# Patient Record
Sex: Female | Born: 1935 | Race: White | Hispanic: No | Smoking: Former smoker
Health system: Southern US, Community
[De-identification: ages and names within clinical notes are randomized; demographics above are authoritative.]

## PROBLEM LIST (undated history)

## (undated) DIAGNOSIS — R0789 Other chest pain: Secondary | ICD-10-CM

## (undated) DIAGNOSIS — S42309A Unspecified fracture of shaft of humerus, unspecified arm, initial encounter for closed fracture: Secondary | ICD-10-CM

## (undated) DIAGNOSIS — F0390 Unspecified dementia without behavioral disturbance: Secondary | ICD-10-CM

## (undated) DIAGNOSIS — I5030 Unspecified diastolic (congestive) heart failure: Secondary | ICD-10-CM

## (undated) DIAGNOSIS — I10 Essential (primary) hypertension: Secondary | ICD-10-CM

## (undated) DIAGNOSIS — Z9981 Dependence on supplemental oxygen: Secondary | ICD-10-CM

## (undated) DIAGNOSIS — Z8679 Personal history of other diseases of the circulatory system: Secondary | ICD-10-CM

## (undated) DIAGNOSIS — R296 Repeated falls: Secondary | ICD-10-CM

## (undated) DIAGNOSIS — K219 Gastro-esophageal reflux disease without esophagitis: Secondary | ICD-10-CM

## (undated) DIAGNOSIS — F03A0 Unspecified dementia, mild, without behavioral disturbance, psychotic disturbance, mood disturbance, and anxiety: Secondary | ICD-10-CM

## (undated) DIAGNOSIS — S42209A Unspecified fracture of upper end of unspecified humerus, initial encounter for closed fracture: Secondary | ICD-10-CM

## (undated) DIAGNOSIS — W19XXXA Unspecified fall, initial encounter: Secondary | ICD-10-CM

## (undated) DIAGNOSIS — M1990 Unspecified osteoarthritis, unspecified site: Secondary | ICD-10-CM

## (undated) DIAGNOSIS — I350 Nonrheumatic aortic (valve) stenosis: Secondary | ICD-10-CM

## (undated) DIAGNOSIS — R55 Syncope and collapse: Secondary | ICD-10-CM

## (undated) DIAGNOSIS — R9431 Abnormal electrocardiogram [ECG] [EKG]: Secondary | ICD-10-CM

## (undated) DIAGNOSIS — D696 Thrombocytopenia, unspecified: Secondary | ICD-10-CM

## (undated) DIAGNOSIS — M419 Scoliosis, unspecified: Secondary | ICD-10-CM

## (undated) DIAGNOSIS — I4891 Unspecified atrial fibrillation: Secondary | ICD-10-CM

## (undated) DIAGNOSIS — G459 Transient cerebral ischemic attack, unspecified: Secondary | ICD-10-CM

## (undated) DIAGNOSIS — Z66 Do not resuscitate: Secondary | ICD-10-CM

## (undated) DIAGNOSIS — E46 Unspecified protein-calorie malnutrition: Secondary | ICD-10-CM

## (undated) DIAGNOSIS — A389 Scarlet fever, uncomplicated: Secondary | ICD-10-CM

## (undated) DIAGNOSIS — D649 Anemia, unspecified: Secondary | ICD-10-CM

## (undated) HISTORY — DX: Abnormal electrocardiogram (ECG) (EKG): R94.31

## (undated) HISTORY — PX: CATARACT EXTRACTION: SUR2

## (undated) HISTORY — DX: Syncope and collapse: R55

## (undated) HISTORY — DX: Gastro-esophageal reflux disease without esophagitis: K21.9

## (undated) HISTORY — DX: Unspecified fall, initial encounter: W19.XXXA

## (undated) HISTORY — PX: CHOLECYSTECTOMY: SHX55

## (undated) HISTORY — DX: Essential (primary) hypertension: I10

## (undated) HISTORY — DX: Nonrheumatic aortic (valve) stenosis: I35.0

## (undated) HISTORY — DX: Other chest pain: R07.89

## (undated) HISTORY — DX: Unspecified osteoarthritis, unspecified site: M19.90

## (undated) HISTORY — DX: Personal history of other diseases of the circulatory system: Z86.79

---

## 1997-07-05 DIAGNOSIS — G459 Transient cerebral ischemic attack, unspecified: Secondary | ICD-10-CM

## 1997-07-05 HISTORY — DX: Transient cerebral ischemic attack, unspecified: G45.9

## 2004-07-05 DIAGNOSIS — R55 Syncope and collapse: Secondary | ICD-10-CM

## 2004-07-05 HISTORY — DX: Syncope and collapse: R55

## 2010-03-26 ENCOUNTER — Ambulatory Visit: Payer: Self-pay | Admitting: Cardiology

## 2010-03-26 ENCOUNTER — Ambulatory Visit (HOSPITAL_COMMUNITY): Admission: RE | Admit: 2010-03-26 | Discharge: 2010-03-26 | Payer: Self-pay | Admitting: Cardiology

## 2010-12-01 ENCOUNTER — Ambulatory Visit
Admission: RE | Admit: 2010-12-01 | Discharge: 2010-12-01 | Disposition: A | Discharge status: Home / Self Care | Payer: Self-pay | Source: Ambulatory Visit | Attending: Family Medicine | Admitting: Family Medicine

## 2010-12-01 ENCOUNTER — Other Ambulatory Visit: Payer: Self-pay | Admitting: Family Medicine

## 2010-12-01 DIAGNOSIS — M25559 Pain in unspecified hip: Secondary | ICD-10-CM

## 2010-12-07 ENCOUNTER — Inpatient Hospital Stay: Admission: AD | Admit: 2010-12-07 | Payer: Self-pay | Source: Ambulatory Visit | Admitting: Internal Medicine

## 2010-12-07 ENCOUNTER — Inpatient Hospital Stay (HOSPITAL_COMMUNITY)
Admission: EM | Admit: 2010-12-07 | Discharge: 2010-12-10 | DRG: 552 | Disposition: A | Discharge status: Skilled Nursing Facility | Payer: Medicare Other | Attending: Internal Medicine | Admitting: Internal Medicine

## 2010-12-07 ENCOUNTER — Emergency Department (HOSPITAL_COMMUNITY): Payer: Medicare Other

## 2010-12-07 DIAGNOSIS — F411 Generalized anxiety disorder: Secondary | ICD-10-CM | POA: Diagnosis present

## 2010-12-07 DIAGNOSIS — E119 Type 2 diabetes mellitus without complications: Secondary | ICD-10-CM | POA: Diagnosis present

## 2010-12-07 DIAGNOSIS — I10 Essential (primary) hypertension: Secondary | ICD-10-CM | POA: Diagnosis present

## 2010-12-07 DIAGNOSIS — S3210XA Unspecified fracture of sacrum, initial encounter for closed fracture: Principal | ICD-10-CM | POA: Diagnosis present

## 2010-12-07 DIAGNOSIS — M545 Low back pain, unspecified: Secondary | ICD-10-CM | POA: Diagnosis present

## 2010-12-07 DIAGNOSIS — M810 Age-related osteoporosis without current pathological fracture: Secondary | ICD-10-CM | POA: Diagnosis present

## 2010-12-07 DIAGNOSIS — K219 Gastro-esophageal reflux disease without esophagitis: Secondary | ICD-10-CM | POA: Diagnosis present

## 2010-12-07 DIAGNOSIS — S322XXA Fracture of coccyx, initial encounter for closed fracture: Principal | ICD-10-CM | POA: Diagnosis present

## 2010-12-07 DIAGNOSIS — W19XXXA Unspecified fall, initial encounter: Secondary | ICD-10-CM | POA: Diagnosis present

## 2010-12-07 DIAGNOSIS — K5900 Constipation, unspecified: Secondary | ICD-10-CM | POA: Diagnosis present

## 2010-12-07 DIAGNOSIS — L408 Other psoriasis: Secondary | ICD-10-CM | POA: Diagnosis present

## 2010-12-07 LAB — BASIC METABOLIC PANEL
BUN: 9 mg/dL (ref 6–23)
Calcium: 9.4 mg/dL (ref 8.4–10.5)
GFR calc non Af Amer: >60 mL/min (ref >60)
Glucose, Bld: 116 mg/dL — ABNORMAL HIGH (ref 70–99)

## 2010-12-07 LAB — DIFFERENTIAL
Eosinophils Absolute: 0.1 10*3/uL (ref 0.0–0.7)
Lymphs Abs: 1.4 10*3/uL (ref 0.7–4.0)
Monocytes Relative: 8 % (ref 3–12)
Neutrophils Relative %: 74 % (ref 43–77)

## 2010-12-07 LAB — CBC
Hemoglobin: 11.6 g/dL — ABNORMAL LOW (ref 12.0–15.0)
MCH: 30.4 pg (ref 26.0–34.0)
MCV: 89 fL (ref 78.0–100.0)
Platelets: 241 10*3/uL (ref 150–400)
RBC: 3.81 MIL/uL — ABNORMAL LOW (ref 3.87–5.11)

## 2010-12-08 LAB — GLUCOSE, CAPILLARY: Glucose-Capillary: 105 mg/dL — ABNORMAL HIGH (ref 70–99)

## 2010-12-09 LAB — GLUCOSE, CAPILLARY
Glucose-Capillary: 114 mg/dL — ABNORMAL HIGH (ref 70–99)
Glucose-Capillary: 138 mg/dL — ABNORMAL HIGH (ref 70–99)

## 2010-12-10 ENCOUNTER — Inpatient Hospital Stay
Admission: RE | Admit: 2010-12-10 | Discharge: 2010-12-25 | Disposition: A | Discharge status: Home / Self Care | Payer: Medicare Other | Source: Ambulatory Visit | Attending: Internal Medicine | Admitting: Internal Medicine

## 2010-12-10 LAB — GLUCOSE, CAPILLARY: Glucose-Capillary: 127 mg/dL — ABNORMAL HIGH (ref 70–99)

## 2010-12-11 LAB — GLUCOSE, CAPILLARY: Glucose-Capillary: 154 mg/dL — ABNORMAL HIGH (ref 70–99)

## 2010-12-13 LAB — GLUCOSE, CAPILLARY: Glucose-Capillary: 140 mg/dL — ABNORMAL HIGH (ref 70–99)

## 2010-12-14 LAB — GLUCOSE, CAPILLARY: Glucose-Capillary: 121 mg/dL — ABNORMAL HIGH (ref 70–99)

## 2010-12-15 LAB — GLUCOSE, CAPILLARY: Glucose-Capillary: 101 mg/dL — ABNORMAL HIGH (ref 70–99)

## 2010-12-16 LAB — GLUCOSE, CAPILLARY
Glucose-Capillary: 98 mg/dL (ref 70–99)
Glucose-Capillary: 153 mg/dL — ABNORMAL HIGH (ref 70–99)

## 2010-12-17 LAB — GLUCOSE, CAPILLARY
Glucose-Capillary: 107 mg/dL — ABNORMAL HIGH (ref 70–99)
Glucose-Capillary: 148 mg/dL — ABNORMAL HIGH (ref 70–99)

## 2010-12-18 LAB — GLUCOSE, CAPILLARY: Glucose-Capillary: 132 mg/dL — ABNORMAL HIGH (ref 70–99)

## 2010-12-19 LAB — GLUCOSE, CAPILLARY

## 2010-12-20 LAB — GLUCOSE, CAPILLARY

## 2010-12-21 LAB — GLUCOSE, CAPILLARY: Glucose-Capillary: 165 mg/dL — ABNORMAL HIGH (ref 70–99)

## 2010-12-23 LAB — GLUCOSE, CAPILLARY: Glucose-Capillary: 110 mg/dL — ABNORMAL HIGH (ref 70–99)

## 2010-12-24 LAB — GLUCOSE, CAPILLARY
Glucose-Capillary: 114 mg/dL — ABNORMAL HIGH (ref 70–99)
Glucose-Capillary: 174 mg/dL — ABNORMAL HIGH (ref 70–99)

## 2011-01-18 NOTE — H&P (Signed)
Briana Schultz, Briana Schultz              ACCOUNT NO.:  [id number]  MEDICAL RECORD NO.:  [id number]  LOCATION:  WLED                         FACILITY:  [facility]  PHYSICIAN:  Altha Harm, MDDATE OF BIRTH:  1913/04/22  DATE OF ADMISSION:  12/07/2010 DATE OF DISCHARGE:                             HISTORY & PHYSICAL   CHIEF COMPLAINT:  Sacral pain.  HISTORY OF PRESENT ILLNESS:  Briana Schultz is a very lovely 77 year old lady who fell approximately 1 week ago.  The patient was seen by her primary care physician, evaluated for hip fracture where there was found to be none.  The patient went home with some pain medications.  However, her pain persisted and actually became worse.  She has got excruciating pain with any weightbearing through the right lower extremity.  She went back to her primary care physician today who asked that she come to the emergency room to be further evaluated.  A CT scan of the sacral area reveals an insufficiency fracture in the sacral area.  The patient at this point is unable to tolerate any weightbearing on her lower extremity without any pain.  Additionally, the patient was ambulating with 2 canes prior to this fall and at this point, she is unable to ambulate secondary to the pain.  Pain medication has been attempted as an outpatient and the pain is intractable, likely now requiring IV pain medications.  The patient has had no fever or chills.  She did not have a syncopal episode.  She has had a physical fall out of her chair. During the fall, the patient sustained a skin tear and skin burn along the right upper extremity.  PAST MEDICAL HISTORY:  Significant for the following: 1. Diabetes type 2, diet controlled. 2. Esophageal reflux. 3. Hypertension. 4. Irritable bowel syndrome. 5. Osteoporosis. 6. History of skin cancer. 7. Actinic keratosis. 8. Anxiety disorder. 9. Chronic constipation. 10.History of impetigo. 11.History of orthostatic  hypotension. 12.Progeria. 13.Psoriasis.  FAMILY HISTORY:  Significant for hypertension and anxiety.  SOCIAL HISTORY:  The patient resides by herself.  She has very good support from both her daughter and granddaughter who are here at the bedside with her.  There is no tobacco, alcohol, or drug use.  MEDICATIONS AT HOME:  Include the following: 1. Silver sulfadiazine apply topically to the affected areas. 2. Halobetasol topical apply to legs for psoriasis. 3. Tylenol 325 mg p.o. q.i.d. p.r.n. 4. Docusate 100 mg p.o. daily as needed. 5. Meloxicam 7.5 mg p.o. daily. 6. VitaSmart Complete Senior over-the-counter 1 tablet p.o. daily. 7. Alprazolam 0.5 mg p.o. daily at bedtime. 8. Nexium 40 mg p.o. b.i.d. 9. Amlodipine 2.5 mg p.o. q.h.s. 10.Carvedilol 6.25 mg p.o. b.i.d. 11.Diovan/hydrochlorothiazide 160/12.5 mg p.o. daily.  ALLERGIES: 1. AMOXICILLIN. 2. DOXYCYCLINE.  PRIMARY CARE PHYSICIAN:  Gloriajean Dell. Andrey Campanile, MD  REVIEW OF SYSTEMS:  The patient admits to having occasional polyuria when her blood sugars approach 190.  Otherwise, review of systems negative.  Studies in the emergency room show the following: 1. Hemogram shows a white blood cell count of 8.3, hemoglobin of 11.6,     hematocrit of 33.9, and platelet count of 241.  Sodium 135,  potassium 3.5, chloride 94, bicarbonate 32, BUN 9, and creatinine     0.53.  CT of the pelvis without contrast shows bilateral sacral     alar fracture most consistent with insufficiency fractures. 2. Diverticulosis. 3. X-ray of the lumbar spine, which shows lumbar spondylosis and     degenerative disk disease.  There is bony demineralization.  No     fracture or static instability identified.  PHYSICAL EXAMINATION:  GENERAL:  The patient is a frail-appearing lady resting in bed, does not appear to be in any distress at this time. VITAL SIGNS:  Temperature is 98.2, heart rate 77, blood pressure 190/91, respiratory rate 17, and O2  saturations are 95% on room air. HEENT:  She is normocephalic and atraumatic.  Pupils equally round and reactive to light and accommodation.  Extraocular movements are intact. Oropharynx is moist.  No exudate, erythema, or lesions noted. NECK:  Trachea is midline.  No masses, no thyromegaly, no JVD, no carotid bruit. RESPIRATORY:  The patient has a kyphotic posture, normal respiratory effort, equal excursion bilaterally.  No wheezing or rhonchi noted. CARDIOVASCULAR:  She has got normal S1 and S2.  No murmurs, rubs, or gallops noted.  PMI is nondisplaced.  No heaves or thrills on palpation. ABDOMEN:  Obese, soft, nontender, and nondistended.  No masses, no hepatosplenomegaly is noted. LYMPH NODE SURVEY:  She has got no cervical, axillary, or inguinal lymphadenopathy noted. MUSCULOSKELETAL:  The patient has no warmth, swelling, or erythema around the joints.  The patient defers gait evaluation at this point secondary to anticipated pain. PSYCHIATRIC:  She is alert and oriented x3, good insight and cognition, good recent and remote recall.  ASSESSMENT AND PLAN:  This is a patient who presents with sacral insufficiency fractures and intractable pain.  The patient will be admitted for better pain control and mobilization.  We will get a PT and OT consult.  The question remains as to whether or not this patient will be able to return to her prior living situation and we will ask social worker to speak to the family regarding discharge planning.  The patient will be resumed on her usual medications for her chronic conditions and further therapy will depend upon the patient's initial response to therapy.     Altha Harm, MD     MAM/MEDQ  D:  12/07/2010  T:  12/07/2010  Job:  [id number]  cc:   Gloriajean Dell. Andrey Campanile, M.D. Fax: [phone]  Electronically Signed by Marthann Schiller MD on 01/18/2011 06:07:31 PM

## 2011-02-01 NOTE — Discharge Summary (Signed)
Briana Schultz, Briana Schultz              ACCOUNT NO.:  [id number]  MEDICAL RECORD NO.:  [id number]  LOCATION:  1617                         FACILITY:  [facility]  PHYSICIAN:  Aahil Fredin I Zenda Herskowitz, Briana Schultz      DATE OF BIRTH:  1913-05-18  DATE OF ADMISSION:  12/07/2010 DATE OF DISCHARGE:  12/10/2010                              DISCHARGE SUMMARY   DISCHARGE DIAGNOSES: 1. Bilateral sacral alar fracture, consistent with insufficient     fracture. 2. Osteoporosis. 3. Diverticulosis. 4. Gastroesophageal reflux disease. 5. History of hypertension. 6. History of irritable bowel syndrome. 7. Anxiety disorder. 8. Chronic constipation. 9. History of impetigo. 10.Psoriasis.  DISCHARGE MEDICATIONS: 1. Oxycodone 5 mg IR q.6 h as needed. 2. Vitamin D 3 5000 units every week. 3. Alprazolam 0.5 mg daily at bedtime. 4. Coreg 6.25 mg twice daily. 5. Diovan 160/12.5 mg daily. 6. Docusate 100 mg daily as needed. 7. Halobetasol topical 1 application daily as needed. 8. Meloxicam 7.5 mg daily. 9. Nexium 40 mg twice daily. 10.Tylenol 325 mg p.o. q.i.d. as needed. 11.Vitasmart Complete Senior over-the-counter 1 tablet daily.  PROCEDURES: 1. X-ray of the lumbosacral spine:  Lumbar spondylosis and     degenerative disc disease, bone demineralization.  No fracture     static or static instability identified. 2. CT of the pelvis without contrast:  Bilateral sacral alar fracture     most consistent with insufficient fracture.  May need DEXA scan to     exclude osteoporosis.  BRIEF HISTORY AND PHYSICAL:  This is a 77 year old female who fell approximately 1 week ago.  The patient was seen by her primary care physician and evaluated for hip fracture where there was found to be none.  The patient went home with some pain medication; however, her pain persisted and actually became worse.  She had excruciating pain with any weight-bearing through the right lower extremity.  She went back to the primary care physician  who asked her to come to the emergency room to be further evaluated.  A CT scan of the sacral area suggested bilateral alar fracture.  The patient denies any syncopal event.  HOSPITAL COURSE: 1. Bilateral sacral alar fracture, insufficient fracture with     intractable pain.  The patient was admitted to the hospital and     started on Roxicodone 5 mg p.o. q.6 h as needed, Mobic and Tylenol     as needed.  PT and OT evaluated and recommended nursing home     placement.  Case was discussed with Dr. Magnus Ivan who recommended     conservative management and weight-bearing activity as tolerated.     Accordingly, the patient will be discharged to nursing home.  The     patient will see Dr. Magnus Ivan within 2 weeks.  We will ask the     nursing home to arrange an appointment to ensure healing. 2. Constipation:  The patient will be discharged on MiraLAX and     docusate. 3. Hypertension remained under good control.  CONDITION ON DISCHARGE:  Currently, the patient is stable for discharge.     Briana Timothy Bosie Helper, Briana Schultz     HIE/MEDQ  D:  12/10/2010  T:  12/10/2010  Job:  [id number]  Electronically Signed by Ebony Cargo Briana Schultz on 02/01/2011 02:41:36 PM

## 2011-11-26 ENCOUNTER — Encounter: Payer: Self-pay | Admitting: *Deleted

## 2012-01-06 ENCOUNTER — Emergency Department (HOSPITAL_BASED_OUTPATIENT_CLINIC_OR_DEPARTMENT_OTHER)
Admission: EM | Admit: 2012-01-06 | Discharge: 2012-01-06 | Disposition: A | Discharge status: Home / Self Care | Payer: Medicare Other | Attending: Emergency Medicine | Admitting: Emergency Medicine

## 2012-01-06 ENCOUNTER — Emergency Department (HOSPITAL_BASED_OUTPATIENT_CLINIC_OR_DEPARTMENT_OTHER): Payer: Medicare Other

## 2012-01-06 ENCOUNTER — Encounter (HOSPITAL_BASED_OUTPATIENT_CLINIC_OR_DEPARTMENT_OTHER): Payer: Self-pay | Admitting: *Deleted

## 2012-01-06 DIAGNOSIS — Z79899 Other long term (current) drug therapy: Secondary | ICD-10-CM | POA: Insufficient documentation

## 2012-01-06 DIAGNOSIS — N200 Calculus of kidney: Secondary | ICD-10-CM

## 2012-01-06 DIAGNOSIS — M810 Age-related osteoporosis without current pathological fracture: Secondary | ICD-10-CM | POA: Insufficient documentation

## 2012-01-06 DIAGNOSIS — I10 Essential (primary) hypertension: Secondary | ICD-10-CM | POA: Insufficient documentation

## 2012-01-06 DIAGNOSIS — K219 Gastro-esophageal reflux disease without esophagitis: Secondary | ICD-10-CM | POA: Insufficient documentation

## 2012-01-06 DIAGNOSIS — G8929 Other chronic pain: Secondary | ICD-10-CM | POA: Insufficient documentation

## 2012-01-06 DIAGNOSIS — Z87891 Personal history of nicotine dependence: Secondary | ICD-10-CM | POA: Insufficient documentation

## 2012-01-06 DIAGNOSIS — R319 Hematuria, unspecified: Secondary | ICD-10-CM

## 2012-01-06 DIAGNOSIS — R109 Unspecified abdominal pain: Secondary | ICD-10-CM | POA: Insufficient documentation

## 2012-01-06 DIAGNOSIS — M25569 Pain in unspecified knee: Secondary | ICD-10-CM | POA: Insufficient documentation

## 2012-01-06 LAB — URINALYSIS, ROUTINE W REFLEX MICROSCOPIC
Bilirubin Urine: NEGATIVE
Protein, ur: NEGATIVE mg/dL
Urobilinogen, UA: 0.2 mg/dL (ref 0.0–1.0)

## 2012-01-06 LAB — CBC WITH DIFFERENTIAL/PLATELET
Basophils Absolute: 0 10*3/uL (ref 0.0–0.1)
Eosinophils Relative: 2 % (ref 0–5)
HCT: 32.7 % — ABNORMAL LOW (ref 36.0–46.0)
Lymphocytes Relative: 13 % (ref 12–46)
Lymphs Abs: 1.1 10*3/uL (ref 0.7–4.0)
MCV: 88.4 fL (ref 78.0–100.0)
Monocytes Absolute: 0.6 10*3/uL (ref 0.1–1.0)
RDW: 12.4 % (ref 11.5–15.5)
WBC: 8.4 10*3/uL (ref 4.0–10.5)

## 2012-01-06 LAB — BASIC METABOLIC PANEL
CO2: 31 mEq/L (ref 19–32)
Calcium: 10 mg/dL (ref 8.4–10.5)
Creatinine, Ser: 0.7 mg/dL (ref 0.50–1.10)
Glucose, Bld: 102 mg/dL — ABNORMAL HIGH (ref 70–99)

## 2012-01-06 MED ORDER — ONDANSETRON HCL 4 MG/2ML IJ SOLN
INTRAMUSCULAR | Status: AC
  Administered 2012-01-06: 4 mg via INTRAVENOUS
  Filled 2012-01-06: qty 2

## 2012-01-06 MED ORDER — MORPHINE SULFATE 2 MG/ML IJ SOLN
2.0000 mg | Freq: Once | INTRAMUSCULAR | Status: AC
  Administered 2012-01-06: 2 mg via INTRAVENOUS
  Filled 2012-01-06: qty 1

## 2012-01-06 MED ORDER — CEPHALEXIN 250 MG PO CAPS
500.0000 mg | ORAL_CAPSULE | Freq: Once | ORAL | Status: AC
  Administered 2012-01-06: 500 mg via ORAL
  Filled 2012-01-06: qty 2

## 2012-01-06 MED ORDER — ONDANSETRON HCL 4 MG/2ML IJ SOLN
4.0000 mg | Freq: Once | INTRAMUSCULAR | Status: AC
  Administered 2012-01-06: 4 mg via INTRAVENOUS

## 2012-01-06 MED ORDER — CEPHALEXIN 500 MG PO CAPS: 500.0000 mg | ORAL_CAPSULE | Freq: Two times a day (BID) | ORAL | Status: AC

## 2012-01-06 NOTE — ED Provider Notes (Signed)
History     CSN: [id number]  Arrival date & time 01/06/12  1314   First MD Initiated Contact with Patient 01/06/12 1351      Chief Complaint  Patient presents with  . Hematuria     Patient is a 78 y.o. female presenting with flank pain. The history is provided by the patient and a relative.  Flank Pain This is a new problem. Episode onset: today. The problem occurs constantly. The problem has been gradually worsening. Pertinent negatives include no chest pain, no abdominal pain and no shortness of breath. Exacerbated by: palpation. The symptoms are relieved by rest. She has tried rest for the symptoms. The treatment provided mild relief.  pt reports right flank pain and hematuria today No cp/sob No weakness.   She denies falls She has never had this previously She also reports chronic right knee pain that is not new  Past Medical History  Diagnosis Date  . Fall     secondary to weakness as of  GSBORO OV  03/26/10  . H/O orthostatic hypotension   . Chest discomfort     anterior  . Hypertension   . Aortic valve stenosis     mild to moderate  . Syncope and collapse 2006  . Arthritis   . GERD (gastroesophageal reflux disease)   . Osteoporosis   . Abnormal EKG     Past Surgical History  Procedure Date  . Cholecystectomy     Family History  Problem Relation Age of Onset  . Pneumonia Mother   . Heart failure Father   . Diabetes Father   . Heart disease Sister     History  Substance Use Topics  . Smoking status: Former Games developer  . Smokeless tobacco: Not on file  . Alcohol Use: No    OB History    Grav Para Term Preterm Abortions TAB SAB Ect Mult Living                  Review of Systems  Respiratory: Negative for shortness of breath.   Cardiovascular: Negative for chest pain.  Gastrointestinal: Negative for abdominal pain.  Genitourinary: Positive for flank pain.  All other systems reviewed and are negative.    Allergies  Review of patient's allergies  indicates no known allergies.  Home Medications   Current Outpatient Rx  Name Route Sig Dispense Refill  . ALPRAZOLAM 0.5 MG PO TABS Oral Take 0.5 mg by mouth at bedtime as needed.    Marland Kitchen AMLODIPINE BESYLATE 2.5 MG PO TABS Oral Take 2.5 mg by mouth daily.    Marland Kitchen CALCIUM 1200 PO Oral Take by mouth.    Marland Kitchen NEXIUM PO Oral Take by mouth.    Marland Kitchen HALOBETASOL PROPIONATE 0.05 % EX CREA Topical Apply topically 2 (two) times daily.    . MELOXICAM 7.5 MG PO TABS Oral Take 7.5 mg by mouth daily.    Marland Kitchen DIOVAN PO Oral Take by mouth.    . VALSARTAN PO Oral Take by mouth.      BP 150/53  Pulse 77  Temp 98.3 F (36.8 C) (Oral)  Resp 20  SpO2 96%  Physical Exam CONSTITUTIONAL: frail, but in no distress HEAD AND FACE: Normocephalic/atraumatic EYES: EOMI/PERRL ENMT: Mucous membranes moist NECK: supple no meningeal signs SPINE:entire spine nontender CV: S1/S2 noted Chest - nontender to palpation of posterior ribs, no bruising/crepitance noted LUNGS: Lungs are clear to auscultation bilaterally, no apparent distress ABDOMEN: soft, nontender, no rebound or guarding XL:KGMWN  cva tenderness,  no bruising noted NEURO: Pt is awake/alert, moves all extremitiesx4, no focal motor deficits noted EXTREMITIES: pulses normal, full ROM, right knee nontender without erythema/edema, and full ROM SKIN: warm, color normal PSYCH: no abnormalities of mood noted  ED Course  Procedures   Labs Reviewed  URINALYSIS, ROUTINE W REFLEX MICROSCOPIC - Abnormal; Notable for the following:    Hgb urine dipstick LARGE (*)     All other components within normal limits  URINE MICROSCOPIC-ADD ON  CBC WITH DIFFERENTIAL  BASIC METABOLIC PANEL   1:61 PM Pt presents from home for right flank pain/hematuria, denies history of these symptoms in the past Hematuria noted, will obtain CT imaging 3:33 PM PT MUCH IMPROVED DISCUSSED CT FINDINGS AND NEED FOR UROLOGY FOLLOWUP AS MAY NEED CANCER EVALUATION   SHE HAS PCP FOLLOWUP THIS  MONTH, AND I TOLD HER SHE COULD WAIT UNTIL AFTER THAT EVAL TO SEE UROLOGIST WILL START KEFLEX DISCUSSED STRICT RETURN PRECAUTIONS  MDM  Nursing notes including past medical history and social history reviewed and considered in documentation  labs/vitals reviewed and considered         Joya Gaskins, MD 01/06/12 1534

## 2012-01-06 NOTE — ED Notes (Signed)
Pt. Reports no fall today or recently.  Pt. Has noted pain in the R knee from what she reports arthritis.

## 2012-01-06 NOTE — ED Notes (Signed)
Right flank pain this am. Right knee pain. Hematuria, urinary frequency.

## 2012-01-06 NOTE — ED Notes (Signed)
Family at bedside. 

## 2012-01-17 ENCOUNTER — Emergency Department (HOSPITAL_COMMUNITY): Payer: Medicare Other

## 2012-01-17 ENCOUNTER — Encounter (HOSPITAL_COMMUNITY): Payer: Self-pay | Admitting: *Deleted

## 2012-01-17 ENCOUNTER — Inpatient Hospital Stay (HOSPITAL_COMMUNITY)
Admission: EM | Admit: 2012-01-17 | Discharge: 2012-01-20 | DRG: 184 | Disposition: A | Discharge status: Skilled Nursing Facility | Payer: Medicare Other | Attending: Internal Medicine | Admitting: Internal Medicine

## 2012-01-17 DIAGNOSIS — R5381 Other malaise: Secondary | ICD-10-CM | POA: Diagnosis present

## 2012-01-17 DIAGNOSIS — Y92009 Unspecified place in unspecified non-institutional (private) residence as the place of occurrence of the external cause: Secondary | ICD-10-CM

## 2012-01-17 DIAGNOSIS — S2249XA Multiple fractures of ribs, unspecified side, initial encounter for closed fracture: Principal | ICD-10-CM | POA: Diagnosis present

## 2012-01-17 DIAGNOSIS — I951 Orthostatic hypotension: Secondary | ICD-10-CM | POA: Diagnosis present

## 2012-01-17 DIAGNOSIS — M810 Age-related osteoporosis without current pathological fracture: Secondary | ICD-10-CM | POA: Diagnosis present

## 2012-01-17 DIAGNOSIS — W19XXXA Unspecified fall, initial encounter: Secondary | ICD-10-CM | POA: Diagnosis present

## 2012-01-17 DIAGNOSIS — K219 Gastro-esophageal reflux disease without esophagitis: Secondary | ICD-10-CM | POA: Diagnosis present

## 2012-01-17 DIAGNOSIS — M1990 Unspecified osteoarthritis, unspecified site: Secondary | ICD-10-CM | POA: Diagnosis present

## 2012-01-17 DIAGNOSIS — E871 Hypo-osmolality and hyponatremia: Secondary | ICD-10-CM | POA: Diagnosis present

## 2012-01-17 DIAGNOSIS — S2239XA Fracture of one rib, unspecified side, initial encounter for closed fracture: Secondary | ICD-10-CM

## 2012-01-17 DIAGNOSIS — R296 Repeated falls: Secondary | ICD-10-CM | POA: Diagnosis present

## 2012-01-17 DIAGNOSIS — D72829 Elevated white blood cell count, unspecified: Secondary | ICD-10-CM | POA: Diagnosis present

## 2012-01-17 DIAGNOSIS — I10 Essential (primary) hypertension: Secondary | ICD-10-CM | POA: Diagnosis present

## 2012-01-17 DIAGNOSIS — Z79899 Other long term (current) drug therapy: Secondary | ICD-10-CM

## 2012-01-17 DIAGNOSIS — Z87891 Personal history of nicotine dependence: Secondary | ICD-10-CM

## 2012-01-17 DIAGNOSIS — Z9181 History of falling: Secondary | ICD-10-CM

## 2012-01-17 DIAGNOSIS — I359 Nonrheumatic aortic valve disorder, unspecified: Secondary | ICD-10-CM | POA: Diagnosis present

## 2012-01-17 DIAGNOSIS — D638 Anemia in other chronic diseases classified elsewhere: Secondary | ICD-10-CM | POA: Diagnosis present

## 2012-01-17 HISTORY — DX: Scarlet fever, uncomplicated: A38.9

## 2012-01-17 HISTORY — DX: Scoliosis, unspecified: M41.9

## 2012-01-17 LAB — CBC WITH DIFFERENTIAL/PLATELET
Basophils Absolute: 0 10*3/uL (ref 0.0–0.1)
Eosinophils Absolute: 0.1 10*3/uL (ref 0.0–0.7)
Eosinophils Relative: 1 % (ref 0–5)
MCH: 31.2 pg (ref 26.0–34.0)
MCHC: 35.5 g/dL (ref 30.0–36.0)
MCV: 87.9 fL (ref 78.0–100.0)
Platelets: 167 10*3/uL (ref 150–400)
RDW: 12.7 % (ref 11.5–15.5)

## 2012-01-17 LAB — BASIC METABOLIC PANEL
Calcium: 9.3 mg/dL (ref 8.4–10.5)
GFR calc non Af Amer: 71 mL/min — ABNORMAL LOW (ref >90)
Sodium: 128 mEq/L — ABNORMAL LOW (ref 135–145)

## 2012-01-17 MED ORDER — ONDANSETRON HCL 4 MG/2ML IJ SOLN
4.0000 mg | Freq: Four times a day (QID) | INTRAMUSCULAR | Status: DC | PRN
  Administered 2012-01-17: 4 mg via INTRAVENOUS
  Filled 2012-01-17: qty 2

## 2012-01-17 MED ORDER — BACITRACIN ZINC 500 UNIT/GM EX OINT
1.0000 "application " | TOPICAL_OINTMENT | Freq: Two times a day (BID) | CUTANEOUS | Status: DC
  Administered 2012-01-17: 1 via TOPICAL
  Filled 2012-01-17: qty 0.9
  Filled 2012-01-17: qty 15

## 2012-01-17 MED ORDER — ONDANSETRON HCL 4 MG PO TABS
4.0000 mg | ORAL_TABLET | Freq: Four times a day (QID) | ORAL | Status: DC | PRN
  Administered 2012-01-20: 4 mg via ORAL
  Filled 2012-01-17: qty 1

## 2012-01-17 MED ORDER — ENOXAPARIN SODIUM 30 MG/0.3ML ~~LOC~~ SOLN
30.0000 mg | SUBCUTANEOUS | Status: DC
  Administered 2012-01-17 – 2012-01-20 (×4): 30 mg via SUBCUTANEOUS
  Filled 2012-01-17 (×4): qty 0.3

## 2012-01-17 MED ORDER — HALOBETASOL PROPIONATE 0.05 % EX CREA
TOPICAL_CREAM | Freq: Two times a day (BID) | CUTANEOUS | Status: DC
  Administered 2012-01-17 – 2012-01-20 (×7): via TOPICAL
  Filled 2012-01-17: qty 15

## 2012-01-17 MED ORDER — SODIUM CHLORIDE 0.9 % IJ SOLN
3.0000 mL | Freq: Two times a day (BID) | INTRAMUSCULAR | Status: DC
  Administered 2012-01-17 – 2012-01-20 (×6): 3 mL via INTRAVENOUS

## 2012-01-17 MED ORDER — AMLODIPINE BESYLATE 5 MG PO TABS
5.0000 mg | ORAL_TABLET | Freq: Every day | ORAL | Status: DC
  Administered 2012-01-17 – 2012-01-20 (×4): 5 mg via ORAL
  Filled 2012-01-17 (×5): qty 1

## 2012-01-17 MED ORDER — SODIUM CHLORIDE 0.9 % IV SOLN
INTRAVENOUS | Status: DC
  Administered 2012-01-17 – 2012-01-18 (×2): via INTRAVENOUS

## 2012-01-17 MED ORDER — BIOTENE DRY MOUTH MT LIQD
15.0000 mL | Freq: Two times a day (BID) | OROMUCOSAL | Status: DC
  Administered 2012-01-17 – 2012-01-20 (×6): 15 mL via OROMUCOSAL

## 2012-01-17 MED ORDER — ONDANSETRON HCL 4 MG/2ML IJ SOLN: 4.0000 mg | Freq: Three times a day (TID) | INTRAMUSCULAR | Status: DC | PRN

## 2012-01-17 MED ORDER — SODIUM CHLORIDE 0.9 % IV SOLN
Freq: Once | INTRAVENOUS | Status: AC
  Administered 2012-01-17: 08:00:00 via INTRAVENOUS

## 2012-01-17 MED ORDER — VALSARTAN-HYDROCHLOROTHIAZIDE 160-25 MG PO TABS: 1.0000 | ORAL_TABLET | Freq: Every day | ORAL | Status: DC

## 2012-01-17 MED ORDER — HYDROMORPHONE HCL PF 1 MG/ML IJ SOLN
0.5000 mg | INTRAMUSCULAR | Status: AC | PRN
  Administered 2012-01-17 (×2): 0.5 mg via INTRAVENOUS
  Filled 2012-01-17 (×2): qty 1

## 2012-01-17 MED ORDER — CHLORHEXIDINE GLUCONATE 0.12 % MT SOLN
15.0000 mL | Freq: Two times a day (BID) | OROMUCOSAL | Status: DC
  Administered 2012-01-17 – 2012-01-20 (×6): 15 mL via OROMUCOSAL
  Filled 2012-01-17 (×8): qty 15

## 2012-01-17 MED ORDER — HALOBETASOL PROPIONATE 0.05 % EX CREA
TOPICAL_CREAM | Freq: Two times a day (BID) | CUTANEOUS | Status: DC
  Filled 2012-01-17 (×18): qty 15

## 2012-01-17 MED ORDER — HYDROCHLOROTHIAZIDE 25 MG PO TABS
25.0000 mg | ORAL_TABLET | Freq: Every day | ORAL | Status: DC
  Administered 2012-01-17 – 2012-01-20 (×4): 25 mg via ORAL
  Filled 2012-01-17 (×5): qty 1

## 2012-01-17 MED ORDER — VITAMIN D3 25 MCG (1000 UNIT) PO TABS: 5000.0000 [IU] | ORAL_TABLET | ORAL | Status: DC

## 2012-01-17 MED ORDER — GLUCERNA SHAKE PO LIQD
237.0000 mL | Freq: Two times a day (BID) | ORAL | Status: DC
  Administered 2012-01-17 – 2012-01-20 (×6): 237 mL via ORAL
  Filled 2012-01-17 (×7): qty 237

## 2012-01-17 MED ORDER — OXYCODONE-ACETAMINOPHEN 5-325 MG PO TABS
2.0000 | ORAL_TABLET | Freq: Once | ORAL | Status: AC
  Administered 2012-01-17: 2 via ORAL
  Filled 2012-01-17: qty 2

## 2012-01-17 MED ORDER — CALCIUM CARBONATE 1250 (500 CA) MG PO TABS
1.0000 | ORAL_TABLET | Freq: Two times a day (BID) | ORAL | Status: DC
  Administered 2012-01-17 – 2012-01-20 (×7): 500 mg via ORAL
  Filled 2012-01-17 (×9): qty 1

## 2012-01-17 MED ORDER — HYDROCODONE-ACETAMINOPHEN 5-325 MG PO TABS
1.0000 | ORAL_TABLET | ORAL | Status: DC | PRN
  Administered 2012-01-18 (×4): 1 via ORAL
  Administered 2012-01-19 – 2012-01-20 (×4): 2 via ORAL
  Filled 2012-01-17: qty 1
  Filled 2012-01-17 (×4): qty 2
  Filled 2012-01-17 (×3): qty 1
  Filled 2012-01-17: qty 2

## 2012-01-17 MED ORDER — MORPHINE SULFATE 2 MG/ML IJ SOLN: 1.0000 mg | INTRAMUSCULAR | Status: DC | PRN

## 2012-01-17 MED ORDER — CARVEDILOL 6.25 MG PO TABS
6.2500 mg | ORAL_TABLET | Freq: Two times a day (BID) | ORAL | Status: DC
  Administered 2012-01-17 – 2012-01-20 (×7): 6.25 mg via ORAL
  Filled 2012-01-17 (×9): qty 1

## 2012-01-17 MED ORDER — CALCIUM CARBONATE 600 MG PO TABS
600.0000 mg | ORAL_TABLET | Freq: Two times a day (BID) | ORAL | Status: DC
  Filled 2012-01-17 (×3): qty 1

## 2012-01-17 MED ORDER — ALPRAZOLAM 0.5 MG PO TABS: 0.5000 mg | ORAL_TABLET | Freq: Every evening | ORAL | Status: DC | PRN

## 2012-01-17 MED ORDER — DOCUSATE SODIUM 100 MG PO CAPS
100.0000 mg | ORAL_CAPSULE | Freq: Two times a day (BID) | ORAL | Status: DC | PRN
  Administered 2012-01-19 (×2): 100 mg via ORAL
  Filled 2012-01-17 (×2): qty 1

## 2012-01-17 MED ORDER — PANTOPRAZOLE SODIUM 40 MG PO TBEC
40.0000 mg | DELAYED_RELEASE_TABLET | Freq: Every day | ORAL | Status: DC
  Administered 2012-01-17 – 2012-01-20 (×4): 40 mg via ORAL
  Filled 2012-01-17 (×5): qty 1

## 2012-01-17 MED ORDER — IRBESARTAN 150 MG PO TABS
150.0000 mg | ORAL_TABLET | Freq: Every day | ORAL | Status: DC
  Administered 2012-01-17 – 2012-01-20 (×4): 150 mg via ORAL
  Filled 2012-01-17 (×4): qty 1

## 2012-01-17 NOTE — ED Notes (Signed)
Pt complaining of left leg pain at present. Says "her leg doesn't feel right".

## 2012-01-17 NOTE — H&P (Signed)
[facility] History and Physical  Briana Schultz ZOX:[id number] DOB: [birth year] DOA: 01/17/2012  Referring physician: ED physician PCP: No primary provider on file.   Chief Complaint: Fall  HPI:  Patient is 78 year old female with history of falls, orthostatic hypotension, aortic stenosis and advanced degenerative joint disease who presents to [facility] long emergency department after sustaining episode of fall in the evening prior to admission. She reports she was walking trying to get to bed and she lost her balance. She thinks she struck her left ribs while falling down. Currently reports lower area back pain, dull, 7/10 in severity, aggravated by movement, alleviated by rest, associated with small skin tear on the right wrist. Patient denies fevers and chills, no chest pain, no shortness of breath, no palpitations no abdominal or urinary concerns. Patient also denies specific focal neurologic weakness, no visual changes, no headaches  Review of Systems:   Constitutional: Negative for fever, chills and malaise/fatigue. Negative for diaphoresis.  HENT: Negative for hearing loss, ear pain, nosebleeds, congestion, sore throat, neck pain, tinnitus and ear discharge.   Eyes: Negative for blurred vision, double vision, photophobia, pain, discharge and redness.  Respiratory: Negative for cough, hemoptysis, sputum production, shortness of breath, wheezing and stridor.   Cardiovascular: Negative for chest pain, palpitations, orthopnea, claudication and leg swelling.  Gastrointestinal: Negative for nausea, vomiting and abdominal pain. Negative for heartburn, constipation, blood in stool and melena.  Genitourinary: Negative for dysuria, urgency, frequency, hematuria and flank pain.  Musculoskeletal: Negative for myalgias, positive for back pain, joint pain and falls.  Skin: Negative for itching and rash.  Neurological: Negative for dizziness and weakness. Negative for tingling, tremors, sensory  change, speech change, focal weakness, loss of consciousness and headaches.  Endo/Heme/Allergies: Negative for environmental allergies and polydipsia. Does not bruise/bleed easily.  Psychiatric/Behavioral: Negative for suicidal ideas. The patient is not nervous/anxious.      Past Medical History  Diagnosis Date  . Fall     secondary to weakness as of  GSBORO OV  03/26/10  . H/O orthostatic hypotension   . Chest discomfort     anterior  . Hypertension   . Aortic valve stenosis     mild to moderate  . Syncope and collapse 2006  . Arthritis   . GERD (gastroesophageal reflux disease)   . Osteoporosis   . Abnormal EKG    Past Surgical History  Procedure Date  . Cholecystectomy    Social History:  reports that she has quit smoking. She does not have any smokeless tobacco history on file. She reports that she does not drink alcohol or use illicit drugs.  No Known Allergies  Family History  Problem Relation Age of Onset  . Pneumonia Mother   . Heart failure Father   . Diabetes Father   . Heart disease Sister     Prior to Admission medications   Medication Sig Start Date End Date Taking? Authorizing Provider  ALPRAZolam Prudy Feeler) 0.5 MG tablet Take 0.5 mg by mouth at bedtime as needed.   Yes Historical Provider, MD  amLODipine (NORVASC) 5 MG tablet Take 5 mg by mouth daily.   Yes Historical Provider, MD  calcium carbonate (OS-CAL) 600 MG TABS Take 600 mg by mouth 2 (two) times daily with a meal.   Yes Historical Provider, MD  carvedilol (COREG) 6.25 MG tablet Take 6.25 mg by mouth 2 (two) times daily with a meal.   Yes Historical Provider, MD  cephALEXin (KEFLEX) 500 MG capsule Take 500 mg by  mouth 2 (two) times daily.   Yes Historical Provider, MD  cholecalciferol (VITAMIN D) 1000 UNITS tablet Take 5,000 Units by mouth once a week. On Fridays. Confirmed by family member   Yes Historical Provider, MD  docusate sodium (COLACE) 100 MG capsule Take 100 mg by mouth 2 (two) times daily as  needed.   Yes Historical Provider, MD  esomeprazole (NEXIUM) 40 MG capsule Take 40 mg by mouth 2 (two) times daily as needed.   Yes Historical Provider, MD  halobetasol (ULTRAVATE) 0.05 % cream Apply topically 2 (two) times daily.   Yes Historical Provider, MD  HYDROcodone-acetaminophen (VICODIN) 5-500 MG per tablet Take 1 tablet by mouth every 4 (four) hours as needed.   Yes Historical Provider, MD  meloxicam (MOBIC) 7.5 MG tablet Take 7.5 mg by mouth 2 (two) times daily.    Yes Historical Provider, MD  Multiple Vitamin (MULTIVITAMIN WITH MINERALS) TABS Take 1 tablet by mouth daily.   Yes Historical Provider, MD  valsartan-hydrochlorothiazide (DIOVAN-HCT) 160-25 MG per tablet Take 1 tablet by mouth daily.   Yes Historical Provider, MD  cephALEXin (KEFLEX) 500 MG capsule Take 1 capsule (500 mg total) by mouth 2 (two) times daily. 01/06/12 01/16/12  Joya Gaskins, MD   Physical Exam: Filed Vitals:   01/17/12 0505 01/17/12 0510 01/17/12 0720 01/17/12 0725  BP: 150/70 170/53  159/58  Pulse: 80  67 81  Temp:  97.9 F (36.6 C)    TempSrc:  Oral    Resp: 15 26    SpO2: 96% 94% 93% 95%    Physical Exam  Constitutional: No distress.  HENT: Normocephalic. External right and left ear normal. Oropharynx is clear and moist.  Eyes: Conjunctivae and EOM are normal. PERRLA, no scleral icterus.  Neck: Normal ROM. Neck supple. No JVD. No tracheal deviation. No thyromegaly.  CVS: RRR, S1/S2 +, SEM 2/6, no gallops, no carotid bruit.  Pulmonary: Effort and breath sounds normal, no stridor, rhonchi, wheezes, rales.  Abdominal: Soft. BS +,  no distension, tenderness, rebound or guarding.  Musculoskeletal: Range of motion in upper extremities appear normal, exam limited in lower extremities due to pain  Lymphadenopathy: No lymphadenopathy noted, cervical, inguinal. Neuro: Alert. Normal reflexes, muscle tone coordination. No cranial nerve deficit. Skin: Skin is warm and dry. No rash noted. Not diaphoretic.  No erythema. No pallor.  Psychiatric: Normal mood and affect. Behavior, judgment, thought content normal.   Labs on Admission:  Basic Metabolic Panel:  Lab 01/17/12 1610  NA 128*  K 3.6  CL 88*  CO2 30  GLUCOSE 109*  BUN 19  CREATININE 0.67  CALCIUM 9.3  MG --  PHOS --   CBC:  Lab 01/17/12 0620  WBC 12.2*  NEUTROABS 10.4*  HGB 11.6*  HCT 32.7*  MCV 87.9  PLT 167    Radiological Exams on Admission:  Dg Ribs Unilateral W/chest Left 01/17/2012    IMPRESSION:  Nondisplaced left rib fractures, probably multiple.   Nondisplaced fracture of the distal left clavicle.   Cardiac enlargement.  Pulmonary fibrosis.    Dg Lumbar Spine Complete 01/17/2012    IMPRESSION:  Diffuse degenerative change and scoliosis of the lumbar spine.   No displaced fractures identified.    Dg Pelvis 1-2 Views 01/17/2012    IMPRESSION:  No acute fractures identified.    EKG: Normal sinus rhythm, no ST/T wave changes  Assessment/Plan  Fall - This is likely secondary to progressive deconditioning in the setting of severe degenerative joint disease as  noted above on imaging studies - Will admit patient to medical floor for further evaluation and management - Will provide supportive care with analgesia as needed for adequate symptom control - PT evaluation  Leukocytosis - Likely reactive and secondary to primary event - CBC in the morning  Hyponatremia - Likely of prerenal etiology - Will provide gentle hydration - BMP in the morning  Anemia of chronic disease - Hemoglobin and hematocrit are stable and at patient's baseline - CBC in the AM  Code Status: Full code Family Communication: Patient and family at bed side Disposition Plan: Will admit to medical floor for further evaluation   Debbora Presto, MD  [facility] Pager [phone]  If 7PM-7AM, please contact night-coverage [email or web address] Password [facility] 01/17/2012, 8:32 AM

## 2012-01-17 NOTE — Progress Notes (Signed)
INITIAL ADULT NUTRITION ASSESSMENT Date: 01/17/2012   Time: 2:53 PM Reason for Assessment: Nutrition risk for dysphagia  ASSESSMENT: Female 78 y.o.  Dx: Fall  Hx:  Past Medical History  Diagnosis Date  . Fall     secondary to weakness as of  [place] OV  03/26/10  . H/O orthostatic hypotension   . Chest discomfort     anterior  . Hypertension   . Aortic valve stenosis     mild to moderate  . Syncope and collapse 2006  . Arthritis   . GERD (gastroesophageal reflux disease)   . Osteoporosis   . Abnormal EKG   . Scarlet fever   . Scoliosis     Related Meds:  Scheduled Meds:   . sodium chloride   Intravenous Once  . amLODipine  5 mg Oral Daily  . antiseptic oral rinse  15 mL Mouth Rinse q12n4p  . bacitracin  1 application Topical BID  . calcium carbonate  1 tablet Oral BID WC  . carvedilol  6.25 mg Oral BID WC  . chlorhexidine  15 mL Mouth Rinse BID  . cholecalciferol  5,000 Units Oral Weekly  . enoxaparin (LOVENOX) injection  30 mg Subcutaneous Q24H  . halobetasol   Topical BID  . hydrochlorothiazide  25 mg Oral Daily  . irbesartan  150 mg Oral Daily  . oxyCODONE-acetaminophen  2 tablet Oral Once  . pantoprazole  40 mg Oral Daily  . sodium chloride  3 mL Intravenous Q12H  . DISCONTD: calcium carbonate  600 mg Oral BID WC  . DISCONTD: halobetasol   Topical BID  . DISCONTD: valsartan-hydrochlorothiazide  1 tablet Oral Daily   Continuous Infusions:   . sodium chloride 50 mL/hr at 01/17/12 1141   PRN Meds:.ALPRAZolam, docusate sodium, HYDROcodone-acetaminophen, HYDROmorphone (DILAUDID) injection, morphine injection, ondansetron (ZOFRAN) IV, ondansetron, DISCONTD: ondansetron (ZOFRAN) IV   Ht: 5\' 1"  (154.9 cm)  Wt: 102 lb 12.8 oz (46.63 kg)  Ideal Wt: 47.8 kg  % Ideal Wt: 97% Wt Readings from Last 10 Encounters:  01/17/12 102 lb 12.8 oz (46.63 kg)    BMI: 19.27 kg/m^2 (WNL)  Food/Nutrition Related Hx: Patient reported she is having poor PO intake due to  nausea. Per patient and family pt has not eaten much for the past 2-3 days. Patient reported she consumes all sugar free products and drinks Glucerna nutrition supplement daily. Patient denies any problems chewing or swallowing.   Labs:  CMP     Component Value Date/Time   NA 128* 01/17/2012 0620   K 3.6 01/17/2012 0620   CL 88* 01/17/2012 0620   CO2 30 01/17/2012 0620   GLUCOSE 109* 01/17/2012 0620   BUN 19 01/17/2012 0620   CREATININE 0.67 01/17/2012 0620   CALCIUM 9.3 01/17/2012 0620   GFRNONAA 71* 01/17/2012 0620   GFRAA 83* 01/17/2012 0620   No intake or output data in the 24 hours ending 01/17/12 1501   Diet Order: General  Supplements/Tube Feeding: none at this time  IVF:    sodium chloride Last Rate: 50 mL/hr at 01/17/12 1141    Estimated Nutritional Needs:   Kcal: 1000-1200 Protein: 55.6-70 grams Fluid: 1 ml per kcal intake  NUTRITION DIAGNOSIS: -Inadequate oral intake (NI-2.1).  Status: Ongoing  RELATED TO: nausea  AS EVIDENCE BY: pt with poor PO intake over the last 2 to 3 days.   MONITORING/EVALUATION(Goals): PO intake, weights, labs 1. PO intake > 75% at meals, snacks and supplements.   EDUCATION NEEDS: -No education needs  identified at this time  INTERVENTION: 1. Will order patient Glucerna BID. Provides 440 kcal and 20 grams of protein daily.  2. Will order patient magic cup once daily.  3. RD to follow for nutrition plan of care.   Dietitian [phone]  DOCUMENTATION CODES Per approved criteria  -Not Applicable    Iven Finn [facility] 01/17/2012, 2:53 PM

## 2012-01-17 NOTE — ED Notes (Signed)
First attempt to call report to floor, rn.

## 2012-01-17 NOTE — ED Notes (Signed)
ems- fell at home in hallway around 330a. Trying to get to the bathroom, not using walker , sitting in a hallway, blood from skin tear on rt anterior wrist, - 1in 1/2  In length, minor bleeding, covered with gauze. , daughter came to home and asst with mother ,  C/o of left shoulder right knee and back , ems felt no step-off or false movement, MAE fine, no rotation, stated she having abd. Pain. , generalized pain all over. Daughter  is in route to hospital, pcp- is [facility], 5'2 110 lb , hx- arthritis, dm , htn, heart condition, sinus problems, stroke . Has medication or list with her.  Patient has hydrocodone (1) at 11pm 01/16/12.

## 2012-01-17 NOTE — Care Management Note (Signed)
    Page 1 of 1   01/19/2012     1:34:02 PM   CARE MANAGEMENT NOTE 01/19/2012  Patient:  Briana Schultz, Briana Schultz   Account Number:  [id number]  Date Initiated:  01/17/2012  Documentation initiated by:  Lanier Clam  Subjective/Objective Assessment:   ADMITTED W/HYPONATREMIA,RIB FX.     Action/Plan:   FROM HOME ALONE   Anticipated DC Date:  01/19/2012   Anticipated DC Plan:  SKILLED NURSING FACILITY  In-house referral  Clinical Social Worker      DC Planning Services  CM consult      Choice offered to / List presented to:             Status of service:  Completed, signed off Medicare Important Message given?   (If response is "NO", the following Medicare IM given date fields will be blank) Date Medicare IM given:   Date Additional Medicare IM given:    Discharge Disposition:  SKILLED NURSING FACILITY  Per UR Regulation:  Reviewed for med. necessity/level of care/duration of stay  If discussed at Long Length of Stay Meetings, dates discussed:    Comments:  01/17/12 [facility] RN,BSN NCM [phone]

## 2012-01-17 NOTE — ED Provider Notes (Addendum)
History     CSN: [id number]  Arrival date & time 01/17/12  0507   First MD Initiated Contact with Patient 01/17/12 [phone]      Chief Complaint  Patient presents with  . Fall    fell in hallway of home    (Consider location/radiation/quality/duration/timing/severity/associated sxs/prior treatment) HPI Comments: Elderly female with a history of falls, orthostatic hypotension, hypertension, aortic valve stenosis, arthritis. She presents after a fall this evening when she lost her balance trying to maneuver to the bathroom. She states that while she fell she tried to catch him and several things including a nearby couch. She thinks that she struck her left ribs and she fell. She has complaints of lower back pain, hip pain bilaterally and pain in the left ribs. She has an associated skin tear to the right wrist. She denies headache head injury or neck pain. This occurred just prior to arrival, was acute in onset. She denies chest pain (prior to fall), shortness of breath, palpitations, fever chills nausea or vomiting.  Patient is a 78 y.o. female presenting with fall. The history is provided by the patient and a relative.  Fall    Past Medical History  Diagnosis Date  . Fall     secondary to weakness as of  [place] OV  03/26/10  . H/O orthostatic hypotension   . Chest discomfort     anterior  . Hypertension   . Aortic valve stenosis     mild to moderate  . Syncope and collapse 2006  . Arthritis   . GERD (gastroesophageal reflux disease)   . Osteoporosis   . Abnormal EKG     Past Surgical History  Procedure Date  . Cholecystectomy     Family History  Problem Relation Age of Onset  . Pneumonia Mother   . Heart failure Father   . Diabetes Father   . Heart disease Sister     History  Substance Use Topics  . Smoking status: Former Games developer  . Smokeless tobacco: Not on file  . Alcohol Use: No    OB History    Grav Para Term Preterm Abortions TAB SAB Ect Mult Living             Review of Systems  All other systems reviewed and are negative.    Allergies  Review of patient's allergies indicates no known allergies.  Home Medications   Current Outpatient Rx  Name Route Sig Dispense Refill  . ALPRAZOLAM 0.5 MG PO TABS Oral Take 0.5 mg by mouth at bedtime as needed.    Marland Kitchen AMLODIPINE BESYLATE 5 MG PO TABS Oral Take 5 mg by mouth daily.    Marland Kitchen CALCIUM CARBONATE 600 MG PO TABS Oral Take 600 mg by mouth 2 (two) times daily with a meal.    . CARVEDILOL 6.25 MG PO TABS Oral Take 6.25 mg by mouth 2 (two) times daily with a meal.    . CEPHALEXIN 500 MG PO CAPS Oral Take 500 mg by mouth 2 (two) times daily.    Marland Kitchen VITAMIN D 1000 UNITS PO TABS Oral Take 5,000 Units by mouth once a week. On Fridays. Confirmed by family member    . DOCUSATE SODIUM 100 MG PO CAPS Oral Take 100 mg by mouth 2 (two) times daily as needed.    Marland Kitchen ESOMEPRAZOLE MAGNESIUM 40 MG PO CPDR Oral Take 40 mg by mouth 2 (two) times daily as needed.    Marland Kitchen HALOBETASOL PROPIONATE 0.05 % EX CREA Topical  Apply topically 2 (two) times daily.    Marland Kitchen HYDROCODONE-ACETAMINOPHEN 5-500 MG PO TABS Oral Take 1 tablet by mouth every 4 (four) hours as needed.    . MELOXICAM 7.5 MG PO TABS Oral Take 7.5 mg by mouth 2 (two) times daily.     . ADULT MULTIVITAMIN W/MINERALS CH Oral Take 1 tablet by mouth daily.    Marland Kitchen VALSARTAN-HYDROCHLOROTHIAZIDE 160-25 MG PO TABS Oral Take 1 tablet by mouth daily.    . CEPHALEXIN 500 MG PO CAPS Oral Take 1 capsule (500 mg total) by mouth 2 (two) times daily. 20 capsule 0    BP 170/53  Pulse 67  Temp 97.9 F (36.6 C) (Oral)  Resp 26  SpO2 93%  Physical Exam  Nursing note and vitals reviewed. Constitutional: She appears well-developed and well-nourished. No distress.  HENT:  Head: Normocephalic and atraumatic.  Mouth/Throat: Oropharynx is clear and moist. No oropharyngeal exudate.  Eyes: Conjunctivae and EOM are normal. Pupils are equal, round, and reactive to light. Right eye  exhibits no discharge. Left eye exhibits no discharge. No scleral icterus.  Neck: Normal range of motion. Neck supple. No JVD present. No thyromegaly present.  Cardiovascular: Normal rate, regular rhythm, normal heart sounds and intact distal pulses.  Exam reveals no gallop and no friction rub.   No murmur heard. Pulmonary/Chest: Effort normal and breath sounds normal. No respiratory distress. She has no wheezes. She has no rales. She exhibits tenderness ( Tender to palpation over the left side of the chest, no crepitance or subcutaneous emphysema).  Abdominal: Soft. Bowel sounds are normal. She exhibits no distension and no mass. There is no tenderness.  Musculoskeletal: Normal range of motion. She exhibits tenderness ( Mild lower back tenderness, mild pain with range of motion of the hips there is good range of motion without crepitance.). She exhibits no edema.  Lymphadenopathy:    She has no cervical adenopathy.  Neurological: She is alert. Coordination normal.  Skin: Skin is warm and dry. No rash noted. No erythema.       Skin tear to the right distal forearm, this is senile purpura, no repairable lacerations.  Psychiatric: She has a normal mood and affect. Her behavior is normal.    ED Course  Procedures (including critical care time)  Labs Reviewed  CBC WITH DIFFERENTIAL - Abnormal; Notable for the following:    WBC 12.2 (*)     RBC 3.72 (*)     Hemoglobin 11.6 (*)     HCT 32.7 (*)     Neutrophils Relative 86 (*)     Neutro Abs 10.4 (*)     Lymphocytes Relative 7 (*)     All other components within normal limits  BASIC METABOLIC PANEL - Abnormal; Notable for the following:    Sodium 128 (*)     Chloride 88 (*)     Glucose, Bld 109 (*)     GFR calc non Af Amer 71 (*)     GFR calc Af Amer 83 (*)     All other components within normal limits   Dg Ribs Unilateral W/chest Left  01/17/2012  *RADIOLOGY REPORT*  Clinical Data: Left anterior rib pain after fall.  LEFT RIBS AND  CHEST - 3+ VIEW  Comparison: None.  Findings: Cardiac enlargement.  Pulmonary vascularity is normal. Scattered pulmonary fibrosis.  No focal consolidation.  Calcified and tortuous aorta.  No blunting of costophrenic angles.  No pneumothorax.  Rib views demonstrate no acute fracture with mild displacement  of the anterolateral left 3rd rib with deformities of the left fourth, fifth, and sixth ribs also probably representing fractures. Nondisplaced fracture of the distal left clavicle.  No focal bone lesion.  IMPRESSION: Nondisplaced left rib fractures, probably multiple.  Nondisplaced fracture of the distal left clavicle.  Cardiac enlargement. Pulmonary fibrosis.  Original Report Authenticated By: Marlon Pel, M.D.   Dg Lumbar Spine Complete  01/17/2012  *RADIOLOGY REPORT*  Clinical Data: Low back pain after fall.  LUMBAR SPINE - COMPLETE 4+ VIEW  Comparison: Lumbar spine 12/07/2010.  CT abdomen and pelvis 01/06/2012.  Findings: Five lumbar type vertebra.  Lumbar scoliosis convex towards the left.  No abnormal anterior subluxation.  Diffuse degenerative changes throughout the lumbar spine with narrowed interspaces and associated endplate hypertrophic changes.  No vertebral compression deformities.  No focal bone lesion or bone destruction.  Bone cortex and trabecular architecture appear intact.  Aortic vascular calcifications.  Surgical clips in the right upper quadrant.  No significant change since previous study.  IMPRESSION: Diffuse degenerative change and scoliosis of the lumbar spine.  No displaced fractures identified.  Original Report Authenticated By: Marlon Pel, M.D.   Dg Pelvis 1-2 Views  01/17/2012  *RADIOLOGY REPORT*  Clinical Data: Pelvic pain after fall.  PELVIS - 1-2 VIEW  Comparison: CT abdomen and pelvis 01/06/2012  Findings: Visualization of the sacrum and upper pelvis is limited due to bowel gas and stool.  Visualized pelvis, sacrum, SI joints, symphysis pubis, and hips appear  intact.  No displaced fractures are appreciated.  No focal bone lesion or bone destruction. Degenerative changes in the lower lumbar spine and hips.  IMPRESSION: No acute fractures identified.  Original Report Authenticated By: Marlon Pel, M.D.     1. Rib fractures   2. Hyponatremia       MDM  No signs of head injury, has focal trauma to the left chest, possibly pelvis though minimal pain with range of motion. We'll place Xeroform gauze with antibiotic cream and occlusive dressing to the skin tear to the right forearm. Imaging pending.   Discussed with [facility] =- will admit - multiple rib frx.  Low Na,  Spirometry ordered, pain meds ordered.  ED ECG REPORT  I personally interpreted this EKG   Date: 01/17/12  Rate: 78  Rhythm: normal sinus rhythm  QRS Axis: left  Intervals: PR prolonged  ST/T Wave abnormalities: nonspecific T wave changes  Conduction Disutrbances:first-degree A-V block  and right bundle branch block  Narrative Interpretation: LVH present  Old EKG Reviewed: none available   Vida Roller, MD 01/17/12 0730  Vida Roller, MD 02/03/12 [phone]

## 2012-01-17 NOTE — ED Notes (Signed)
md at bedside  Pt alert and oriented x4. Respirations even and unlabored, bilateral symmetrical rise and fall of chest. Skin warm and dry. In no acute distress. Denies needs.   

## 2012-01-17 NOTE — ED Notes (Signed)
[id number]<AV> Expected date:<BR> Expected time:<BR> Means of arrival:<BR> Comments:<BR> [place], 89 F, Fall, Shoulder Pain

## 2012-01-17 NOTE — ED Notes (Signed)
Report given to jennifer, rn on floor.  

## 2012-01-17 NOTE — Clinical Social Work Psychosocial (Unsigned)
     Clinical Social Work Department BRIEF PSYCHOSOCIAL ASSESSMENT 01/17/2012  Patient:  Briana Schultz, Briana Schultz     Account Number:  [id number]     Admit date:  01/17/2012  Clinical Social Worker:  Hattie Perch  Date/Time:  01/17/2012 12:00 M  Referred by:  Physician  Date Referred:  01/17/2012 Referred for  SNF Placement   Other Referral:   Interview type:  Patient Other interview type:   daughters    PSYCHOSOCIAL DATA Living Status:  FAMILY Admitted from facility:   Level of care:   Primary support name:  glenda angel Primary support relationship to patient:  CHILD, ADULT Degree of support available:   excellent    CURRENT CONCERNS Current Concerns  Post-Acute Placement   Other Concerns:    SOCIAL WORK ASSESSMENT / PLAN CSW met with patient. patient is alert and oriented X3. patient would like [facility] upon discharge. when asked for back up choices, patient does not have one.   Assessment/plan status:   Other assessment/ plan:   Information/referral to community resources:    PATIENTS/FAMILYS RESPONSE TO PLAN OF CARE: patient is agreeable to be faxed to [facility] and local facilities around there.

## 2012-01-18 LAB — BASIC METABOLIC PANEL
BUN: 13 mg/dL (ref 6–23)
CO2: 33 mEq/L — ABNORMAL HIGH (ref 19–32)
Chloride: 95 mEq/L — ABNORMAL LOW (ref 96–112)
GFR calc Af Amer: 80 mL/min — ABNORMAL LOW (ref >90)
Glucose, Bld: 97 mg/dL (ref 70–99)
Potassium: 3.6 mEq/L (ref 3.5–5.1)

## 2012-01-18 LAB — GLUCOSE, CAPILLARY

## 2012-01-18 LAB — CBC
HCT: 29.4 % — ABNORMAL LOW (ref 36.0–46.0)
Hemoglobin: 10.1 g/dL — ABNORMAL LOW (ref 12.0–15.0)
MCH: 30.8 pg (ref 26.0–34.0)
MCHC: 34.4 g/dL (ref 30.0–36.0)

## 2012-01-18 NOTE — Progress Notes (Addendum)
30-Day note on chart for MD signature.  CSW to continue to follow.  Providence Crosby, LCSWA Clinical Social Work [phone]

## 2012-01-18 NOTE — Evaluation (Signed)
Occupational Therapy Evaluation Patient Details Name: Briana Schultz MRN: [id number] DOB: [birth year] Today's Date: 01/18/2012 Time: 7829-5621 OT Time Calculation (min): 25 min  OT Assessment / Plan / Recommendation Clinical Impression  Patient is 78 year old female with history of falls, orthostatic hypotension, aortic stenosis and advanced degenerative joint disease who presents to [facility] long emergency department after sustaining episode of fall in the evening prior to admission. pt displays increased pain, decreased strength and functional mobility and would benefit from skilled OT services to improve ADl independence.     OT Assessment  Patient needs continued OT Services    Follow Up Recommendations  Skilled nursing facility    Barriers to Discharge      Equipment Recommendations  Defer to next venue    Recommendations for Other Services    Frequency  Min 2X/week    Precautions / Restrictions Precautions Precautions: Fall Precaution Comments: L rib fractures and L clavicle fracture        ADL  Eating/Feeding: Simulated;Independent Where Assessed - Eating/Feeding: Bed level Grooming: Simulated;Wash/dry hands;Set up Where Assessed - Grooming: Unsupported sitting Upper Body Bathing: Simulated;Chest;Right arm;Left arm;Abdomen;Minimal assistance Where Assessed - Upper Body Bathing: Unsupported sitting Lower Body Bathing: Simulated;Maximal assistance Where Assessed - Lower Body Bathing: Supported sit to stand Upper Body Dressing: Simulated;Moderate assistance Where Assessed - Upper Body Dressing: Unsupported sitting Lower Body Dressing: Simulated;Maximal assistance Where Assessed - Lower Body Dressing: Supported sit to stand Toilet Transfer: Simulated;Minimal assistance Toilet Transfer Method: Stand pivot Toileting - Clothing Manipulation and Hygiene: Simulated;Moderate assistance Where Assessed - Toileting Clothing Manipulation and Hygiene: Standing Tub/Shower Transfer  Method: Not assessed ADL Comments: Pt states she usually needs alittle assist with getting R sock on but can get her pants and underwear started on that side at home. Pt now with increased pain from L rib fractures and L clavicle fracture and has difficulty leaning forward/reaching to even doff L sock.     OT Diagnosis: Generalized weakness;Acute pain  OT Problem List: Decreased strength;Decreased range of motion;Impaired balance (sitting and/or standing);Decreased knowledge of use of DME or AE;Pain OT Treatment Interventions: Self-care/ADL training;Therapeutic activities;DME and/or AE instruction;Patient/family education   OT Goals Acute Rehab OT Goals OT Goal Formulation: With patient Time For Goal Achievement: 02/01/12 Potential to Achieve Goals: Good ADL Goals Pt Will Perform Grooming: with supervision;Standing at sink;Other (comment) (2 tasks) ADL Goal: Grooming - Progress: Goal set today Pt Will Perform Lower Body Bathing: with supervision;Sit to stand from chair;Sit to stand from bed ADL Goal: Lower Body Bathing - Progress: Goal set today Pt Will Perform Lower Body Dressing: with supervision;Sit to stand from bed;Sit to stand from chair ADL Goal: Lower Body Dressing - Progress: Goal set today Pt Will Transfer to Toilet: with supervision;Ambulation;with DME;3-in-1 ADL Goal: Toilet Transfer - Progress: Goal set today Pt Will Perform Toileting - Clothing Manipulation: with supervision;Standing ADL Goal: Toileting - Clothing Manipulation - Progress: Goal set today  Visit Information  Last OT Received On: 01/18/12 Assistance Needed: +1    Subjective Data  Subjective: I spilled prune juice on me this morning Patient Stated Goal: to go to rehab to return home independent   Prior Functioning  Vision/Perception  Home Living Lives With: Alone;Other (Comment) (daughters check in and has housekeeper) Available Help at Discharge: Skilled Nursing Facility Type of Home: House Bathroom  Toilet: Standard Home Adaptive Equipment: Bedside commode/3-in-1;Quad cane;Straight cane;Walker - rolling;Reacher Additional Comments: receives Meals on Wheels also. family also cooks for her Prior Function Level of Independence: Independent with  assistive device(s);Needs assistance Needs Assistance: Meal Prep;Light Housekeeping;Dressing Dressing: Minimal (with R sock) Meal Prep: Total Light Housekeeping: Total Driving: No Communication Communication: No difficulties Dominant Hand: Right      Cognition  Overall Cognitive Status: Appears within functional limits for tasks assessed/performed Arousal/Alertness: Awake/alert Orientation Level: Appears intact for tasks assessed Behavior During Session: [facility] for tasks performed    Extremity/Trunk Assessment Right Upper Extremity Assessment RUE ROM/Strength/Tone: [facility] for tasks assessed; bandage over R forearm -per pt skin tears Left Upper Extremity Assessment LUE ROM/Strength/Tone: Unable to fully assess;Due to pain LUE ROM/Strength/Tone Deficits: Pt with L rib fractures and clavicle fracture. Painful with functional activity. Didnt not formally assess. Nursing to clarfify with MD any ROM or activity restrictions for L UE. Trunk Assessment Trunk Assessment: Kyphotic   Mobility Bed Mobility Bed Mobility: Supine to Sit Supine to Sit: 5: Supervision;HOB elevated Details for Bed Mobility Assistance: increased time due to pain Transfers Transfers: Sit to Stand;Stand to Sit Sit to Stand: 4: Min assist;From bed;With upper extremity assist Stand to Sit: 4: Min assist;With upper extremity assist;To chair/3-in-1 Details for Transfer Assistance: min verbal cues for hand placement   Exercise    Balance    End of Session OT - End of Session Activity Tolerance: Patient limited by pain Patient left: in chair;with call bell/phone within reach  GO     Lennox Laity [phone] 01/18/2012, 10:27 AM

## 2012-01-18 NOTE — Evaluation (Signed)
Physical Therapy Evaluation Patient Details Name: Briana Schultz MRN: [id number] DOB: Oct 13, 1912 Today's Date: 01/18/2012 Time: 4098-1191 PT Time Calculation (min): 16 min  PT Assessment / Plan / Recommendation Clinical Impression  Pt admitted after fall at home with resulting L nondisplaced rib fxs and L distal clavicle fx.  Pt would benefit from acute PT services in order to improve safety and independence with mobility and improve balance.  Pt plans on d/c to SNF.      PT Assessment  Patient needs continued PT services    Follow Up Recommendations  Supervision/Assistance - 24 hour;Skilled nursing facility    Barriers to Discharge        Equipment Recommendations  None recommended by PT    Recommendations for Other Services     Frequency Min 3X/week    Precautions / Restrictions Precautions Precautions: Fall Precaution Comments: L rib fractures and L clavicle fracture   Pertinent Vitals/Pain 5/10 pain L rib cage and lower back, declined ice and pain meds     Mobility  Bed Mobility Bed Mobility: Supine to Sit Supine to Sit: 5: Supervision;HOB elevated Details for Bed Mobility Assistance: increased time due to pain Transfers Transfers: Sit to Stand;Stand to Sit Sit to Stand: 4: Min guard;With upper extremity assist;From chair/3-in-1 Stand to Sit: To chair/3-in-1;With upper extremity assist;4: Min guard Details for Transfer Assistance: min/guard for safety Ambulation/Gait Ambulation/Gait Assistance: 4: Min guard Ambulation Distance (Feet): 160 Feet Assistive device: Rolling walker Ambulation/Gait Assistance Details: to requires bilateral UE support for ambulation, used RW to steady, pt reports only minimal pain in UEs (states everything is sore), pt reports increased fatigue after ambulation Gait Pattern: Step-through pattern;Decreased stride length;Trunk flexed    Exercises     PT Diagnosis: Difficulty walking;Acute pain  PT Problem List: Decreased  mobility;Decreased balance;Decreased activity tolerance;Decreased knowledge of use of DME;Decreased safety awareness PT Treatment Interventions: DME instruction;Gait training;Functional mobility training;Therapeutic activities;Therapeutic exercise;Balance training;Neuromuscular re-education   PT Goals Acute Rehab PT Goals PT Goal Formulation: With patient Time For Goal Achievement: 01/25/12 Potential to Achieve Goals: Good Pt will go Sit to Supine/Side: with supervision PT Goal: Sit to Supine/Side - Progress: Goal set today Pt will go Sit to Stand: with supervision PT Goal: Sit to Stand - Progress: Goal set today Pt will Ambulate: >150 feet;with supervision;with least restrictive assistive device PT Goal: Ambulate - Progress: Goal set today Additional Goals Additional Goal #1: Pt will demonstrated improved balance by requiring one UE support on RW with reaching activity with supervision. PT Goal: Additional Goal #1 - Progress: Goal set today  Visit Information  Last PT Received On: 01/18/12 Assistance Needed: +1    Subjective Data  Subjective: "I'm only passing little balls."  BM   Prior Functioning  Home Living Lives With: Alone;Other (Comment) (daughters check in and has housekeeper) Available Help at Discharge: Skilled Nursing Facility Type of Home: House Bathroom Toilet: Standard Home Adaptive Equipment: Bedside commode/3-in-1;Quad cane;Straight cane;Walker - rolling;Reacher Additional Comments: receives Meals on Wheels also. family also cooks for her Prior Function Level of Independence: Independent with assistive device(s);Needs assistance Needs Assistance: Meal Prep;Light Housekeeping;Dressing Dressing: Minimal (with R sock) Meal Prep: Total Light Housekeeping: Total Driving: No Comments: Pt usually uses RW for mobility Communication Communication: No difficulties Dominant Hand: Right    Cognition  Overall Cognitive Status: Appears within functional limits for  tasks assessed/performed Arousal/Alertness: Awake/alert Orientation Level: Appears intact for tasks assessed Behavior During Session: [facility] for tasks performed    Extremity/Trunk Assessment Right Upper Extremity  Assessment RUE ROM/Strength/Tone: [facility] for tasks assessed Left Upper Extremity Assessment LUE ROM/Strength/Tone: Unable to fully assess;Due to pain LUE ROM/Strength/Tone Deficits: Pt with L rib fractures and clavicle fracture. Painful with functional activity. Didnt not formally assess. Nursing to clarfify with MD any ROM or activity restrictions for L UE. Right Lower Extremity Assessment RLE ROM/Strength/Tone: [facility] for tasks assessed Left Lower Extremity Assessment LLE ROM/Strength/Tone: [facility] for tasks assessed Trunk Assessment Trunk Assessment: Kyphotic   Balance Balance Balance Assessed: No (NT however hx of falls)  End of Session PT - End of Session Activity Tolerance: Patient tolerated treatment well Patient left: in chair;with call bell/phone within reach;with family/visitor present Nurse Communication: Mobility status  GP     Addalyne Vandehei,KATHrine E 01/18/2012, 12:25 PM Pager: [phone]

## 2012-01-18 NOTE — Progress Notes (Addendum)
Clinical Social Work Department CLINICAL SOCIAL WORK PLACEMENT NOTE 01/18/2012  Patient:  Briana Schultz, Briana Schultz  Account Number:  [id number] Admit date:  01/17/2012  Clinical Social Worker:  Doroteo Glassman  Date/time:  01/18/2012 11:22 AM  Clinical Social Work is seeking post-discharge placement for this patient at the following level of care:   SKILLED NURSING   (*CSW will update this form in Epic as items are completed)  declined  Patient/family provided with [facility] Department of Clinical Social Work's list of facilities offering this level of care within the geographic area requested by the patient (or if unable, by the patient's family).  01/18/2012  Patient/family informed of their freedom to choose among providers that offer the needed level of care, that participate in Medicare, Medicaid or managed care program needed by the patient, have an available bed and are willing to accept the patient.  01/18/2012  Patient/family informed of [facility]' ownership interest in [facility], as well as of the fact that they are under no obligation to receive care at this facility.  PASARR submitted to [employer] on 01/17/2012 PASARR number received from [employer] on   FL2 transmitted to all facilities in geographic area requested by pt/family on  01/18/2012 FL2 transmitted to all facilities within larger geographic area on   Patient informed that his/her managed care company has contracts with or will negotiate with  certain facilities, including the following:     Patient/family informed of bed offers received:   Patient chooses bed at  Physician recommends and patient chooses bed at    Patient to be transferred to  on   Patient to be transferred to facility by   The following physician request were entered in Epic:   Additional Comments:  CSW to continue to follow.  Providence Crosby, LCSWA Clinical Social Work [phone]

## 2012-01-18 NOTE — Progress Notes (Signed)
Patient ID: Briana Schultz, female   DOB: [birth year], 78 y.o.   MRN: [id number]  [facility] PROGRESS NOTE  Briana Schultz JXB:[id number] DOB: [birth year] DOA: 01/17/2012 PCP: No primary provider on file.  Brief narrative: Patient is 78 year old female with history of falls, orthostatic hypotension, aortic stenosis and advanced degenerative joint disease who presents to [facility] long emergency department after sustaining episode of fall in the evening prior to admission. SHe was admitted 01/17/2012 for further evaluation.   Principal Problem:  *Fall - likely secondary to progressive deconditioning in the setting of severe DJD - pt now with multiple rib fractures on the left side - she is doing better this AM and repots that pain is better controlled - will continue supportive care with analgesia - PT recommends SNF and will need to discuss with family and proceed with placement if that is desired by pt and family   Active Problems:  Hyponatremia - this was likely secondary to dehydration - within normal limits this AM - BMP in AM   Anemia due to chronic illness - Hg and Hct slightly down from yesterday but remain at pt's baseline - CBC in AM   Leukocytosis - trending down and within normal limits this AM  Consultants:  None  Procedures:  None  Antibiotics:  None  HPI/Subjective: No events overnight.   Objective: Filed Vitals:   01/17/12 2105 01/17/12 2153 01/18/12 0544 01/18/12 1348  BP: 167/61 128/52 142/76 111/57  Pulse: 73 66 61 63  Temp: 98 F (36.7 C)  98.2 F (36.8 C) 97.7 F (36.5 C)  TempSrc: Oral  Oral Oral  Resp: 19 18 19 18   Height:      Weight:      SpO2: 98%  96% 100%    Intake/Output Summary (Last 24 hours) at 01/18/12 1809 Last data filed at 01/18/12 1517  Gross per 24 hour  Intake    810 ml  Output   1875 ml  Net  -1065 ml    Exam:   General:  Pt is alert, follows commands appropriately, not in acute  distress  Cardiovascular: Regular rate and rhythm, S1/S2, SEM 2/6, no rubs, no gallops  Respiratory: Clear to auscultation bilaterally, no wheezing, no crackles, no rhonchi  Abdomen: Soft, non tender, non distended, bowel sounds present, no guarding  Extremities: No edema, pulses DP and PT palpable bilaterally  Neuro: Grossly nonfocal  Data Reviewed: Basic Metabolic Panel:  Lab 01/18/12 1308 01/17/12 0856 01/17/12 0620  NA 135 -- 128*  K 3.6 -- 3.6  CL 95* -- 88*  CO2 33* -- 30  GLUCOSE 97 -- 109*  BUN 13 -- 19  CREATININE 0.74 -- 0.67  CALCIUM 9.1 -- 9.3  MG -- 1.9 --  PHOS -- 3.0 --   CBC:  Lab 01/18/12 0430 01/17/12 0620  WBC 6.4 12.2*  NEUTROABS -- 10.4*  HGB 10.1* 11.6*  HCT 29.4* 32.7*  MCV 89.6 87.9  PLT 152 167   CBG:  Lab 01/18/12 0651  GLUCAP 80   Studies: Dg Ribs Unilateral W/chest Left  01/17/2012  *RADIOLOGY REPORT*  Clinical Data: Left anterior rib pain after fall.  LEFT RIBS AND CHEST - 3+ VIEW  Comparison: None.  Findings: Cardiac enlargement.  Pulmonary vascularity is normal. Scattered pulmonary fibrosis.  No focal consolidation.  Calcified and tortuous aorta.  No blunting of costophrenic angles.  No pneumothorax.  Rib views demonstrate no acute fracture with mild displacement of the anterolateral left 3rd rib with deformities  of the left fourth, fifth, and sixth ribs also probably representing fractures. Nondisplaced fracture of the distal left clavicle.  No focal bone lesion.  IMPRESSION: Nondisplaced left rib fractures, probably multiple.  Nondisplaced fracture of the distal left clavicle.  Cardiac enlargement. Pulmonary fibrosis.  Original Report Authenticated By: Marlon Pel, M.D.   Dg Lumbar Spine Complete  01/17/2012  *RADIOLOGY REPORT*  Clinical Data: Low back pain after fall.  LUMBAR SPINE - COMPLETE 4+ VIEW  Comparison: Lumbar spine 12/07/2010.  CT abdomen and pelvis 01/06/2012.  Findings: Five lumbar type vertebra.  Lumbar scoliosis  convex towards the left.  No abnormal anterior subluxation.  Diffuse degenerative changes throughout the lumbar spine with narrowed interspaces and associated endplate hypertrophic changes.  No vertebral compression deformities.  No focal bone lesion or bone destruction.  Bone cortex and trabecular architecture appear intact.  Aortic vascular calcifications.  Surgical clips in the right upper quadrant.  No significant change since previous study.  IMPRESSION: Diffuse degenerative change and scoliosis of the lumbar spine.  No displaced fractures identified.  Original Report Authenticated By: Marlon Pel, M.D.   Dg Pelvis 1-2 Views  01/17/2012  *RADIOLOGY REPORT*  Clinical Data: Pelvic pain after fall.  PELVIS - 1-2 VIEW  Comparison: CT abdomen and pelvis 01/06/2012  Findings: Visualization of the sacrum and upper pelvis is limited due to bowel gas and stool.  Visualized pelvis, sacrum, SI joints, symphysis pubis, and hips appear intact.  No displaced fractures are appreciated.  No focal bone lesion or bone destruction. Degenerative changes in the lower lumbar spine and hips.  IMPRESSION: No acute fractures identified.  Original Report Authenticated By: Marlon Pel, M.D.    Scheduled Meds:   . amLODipine  5 mg Oral Daily  . antiseptic oral rinse  15 mL Mouth Rinse q12n4p  . calcium carbonate  1 tablet Oral BID WC  . carvedilol  6.25 mg Oral BID WC  . chlorhexidine  15 mL Mouth Rinse BID  . cholecalciferol  5,000 Units Oral Weekly  . enoxaparin (LOVENOX) injection  30 mg Subcutaneous Q24H  . feeding supplement  237 mL Oral BID BM  . halobetasol   Topical BID  . hydrochlorothiazide  25 mg Oral Daily  . irbesartan  150 mg Oral Daily  . pantoprazole  40 mg Oral Daily  . sodium chloride  3 mL Intravenous Q12H   Continuous Infusions:   . sodium chloride 50 mL/hr at 01/18/12 0540     Code Status: Full Family Communication: Pt at bedside Disposition Plan: Discuss with family  SNF  Debbora Presto, MD  [facility] Pager [phone]  If 7PM-7AM, please contact night-coverage [email or web address] Password [email or web address] 01/18/2012, 6:09 PM   LOS: 1 day

## 2012-01-19 DIAGNOSIS — D72829 Elevated white blood cell count, unspecified: Secondary | ICD-10-CM

## 2012-01-19 LAB — BASIC METABOLIC PANEL
CO2: 34 mEq/L — ABNORMAL HIGH (ref 19–32)
Calcium: 8.8 mg/dL (ref 8.4–10.5)
Glucose, Bld: 113 mg/dL — ABNORMAL HIGH (ref 70–99)
Potassium: 3.6 mEq/L (ref 3.5–5.1)
Sodium: 135 mEq/L (ref 135–145)

## 2012-01-19 LAB — CBC
Hemoglobin: 10 g/dL — ABNORMAL LOW (ref 12.0–15.0)
MCH: 30.8 pg (ref 26.0–34.0)
RBC: 3.25 MIL/uL — ABNORMAL LOW (ref 3.87–5.11)

## 2012-01-19 MED ORDER — ALPRAZOLAM 0.5 MG PO TABS: 0.5000 mg | ORAL_TABLET | Freq: Every evening | ORAL | Status: DC | PRN

## 2012-01-19 MED ORDER — IRBESARTAN 150 MG PO TABS: 150.0000 mg | ORAL_TABLET | Freq: Every day | ORAL | Status: DC

## 2012-01-19 MED ORDER — GLUCERNA SHAKE PO LIQD: 237.0000 mL | Freq: Two times a day (BID) | ORAL | Status: DC

## 2012-01-19 MED ORDER — HYDROCODONE-ACETAMINOPHEN 5-325 MG PO TABS: 1.0000 | ORAL_TABLET | Freq: Four times a day (QID) | ORAL | Status: DC | PRN

## 2012-01-19 MED ORDER — AMLODIPINE BESYLATE 5 MG PO TABS: 10.0000 mg | ORAL_TABLET | Freq: Every day | ORAL | Status: DC

## 2012-01-19 MED ORDER — CARVEDILOL 6.25 MG PO TABS: 12.5000 mg | ORAL_TABLET | Freq: Two times a day (BID) | ORAL | Status: DC

## 2012-01-19 NOTE — Progress Notes (Signed)
Faxed requested documentation to [facility] for Pasarr review.  CSW to continue to follow.  Providence Crosby, LCSWA Clinical Social Work [phone]

## 2012-01-19 NOTE — Progress Notes (Signed)
LM for [facility] admissions person, Lynnea Ferrier, re: Pt.  CSW to continue to follow.  Providence Crosby, LCSWA Clinical Social Work [phone]

## 2012-01-19 NOTE — Discharge Summary (Signed)
Physician Discharge Summary  Briana Schultz JXB:[id number] DOB: 1913-02-28 DOA: 01/17/2012  PCP: No primary provider on file.  Admit date: 01/17/2012 Discharge date: 01/19/2012  Recommendations for Outpatient Follow-up:  1. Follow with PCP in 2 weeks; make sure to follow BMET for electrolytes check. Will also need BP to be recheck and medication adjustments if needed. Lasix discontinue due to hyponatremia and also orthostasis. 2. Discharge to SNF for physical rehab. 3. Instructed to keep herself hydrated and to increase Po intake.  Discharge Diagnoses:  1-Fall: due to orthostatic changes, medications (narcotics) and deconditioning due to severe DJD. Pain is now stable; patient received IVF's and is currently euvolemic and stable. Will discharge to SNF for physical rehab.  2-Hyponatremia: due to diuretics and dehydration. Resolved with IVF's. HCTZ discontinue.  3-Anemia due to chronic illness: stable. Will need follow up of her Hgb as an outpatient. No bleeding appreciated.  4-HTN: will continue amlodipine, ibesartan and also coreg. Heart healthy diet recommended.  5-Leukocytosis: due to demargination; back to baseline at discharge after IVF's.  6-Protein calorie malnutrition: continue feeding supplements.  7-GERD: continue PPI     Discharge Condition:  Discharge in stable and improved condition; no SOB, no fever and with well controlled pain. Patient will be discharged to SNF for physical rehabilitation. Will arrange follow up with PCP in 2 weeks.  Diet recommendation: Heart healthy diet  History of present illness:  78 year old female with history of falls, orthostatic hypotension, aortic stenosis and advanced degenerative joint disease who presents to [facility] long emergency department after sustaining episode of fall in the evening prior to admission. She reports she was walking trying to get to bed and she lost her balance. She thinks she struck her left ribs while falling down.  Currently reports lower area back pain, dull, 7/10 in severity, aggravated by movement, alleviated by rest, associated with small skin tear on the right wrist. Patient denies fevers and chills, no chest pain, no shortness of breath, no palpitations no abdominal or urinary concerns. Patient also denies specific focal neurologic weakness, no visual changes, no headaches.  Procedures:  None  Consultations:  PT  Discharge Exam: Filed Vitals:   01/19/12 0506  BP: 156/63  Pulse: 75  Temp: 97.5 F (36.4 C)  Resp: 18   Filed Vitals:   01/18/12 0544 01/18/12 1348 01/18/12 2230 01/19/12 0506  BP: 142/76 111/57 133/57 156/63  Pulse: 61 63 67 75  Temp: 98.2 F (36.8 C) 97.7 F (36.5 C) 97.8 F (36.6 C) 97.5 F (36.4 C)  TempSrc: Oral Oral Oral Oral  Resp: 19 18 16 18   Height:      Weight:      SpO2: 96% 100% 91% 94%   General: NAD; AAOX3. Cardiovascular: RRR, positive systolic murmur Respiratory: CTA bilaterally; good air movement. Abdomen: soft, no tenderness, no distension. MSK: no joint swelling; no erythema over joints. Diffuse bone pain. Neuro: no focal deficit.   Discharge Instructions  Discharge Orders    Future Orders Please Complete By Expires   Increase activity slowly      Discharge instructions      Comments:   -Keep patient well hydrated -Assistance and care to prevent further falls -Follow discharge instructions and also medications as prescribed -Heating pads to right lumbar area as needed for pain and comfort -Overlay mattress and reposition to prevent pressure ulcers     Medication List  As of 01/19/2012 12:50 PM   STOP taking these medications  cephALEXin 500 MG capsule      HYDROcodone-acetaminophen 5-500 MG per tablet      valsartan-hydrochlorothiazide 160-25 MG per tablet         TAKE these medications         ALPRAZolam 0.5 MG tablet   Commonly known as: XANAX   Take 1 tablet (0.5 mg total) by mouth at bedtime as needed.       amLODipine 5 MG tablet   Commonly known as: NORVASC   Take 2 tablets (10 mg total) by mouth daily.      calcium carbonate 600 MG Tabs   Commonly known as: OS-CAL   Take 600 mg by mouth 2 (two) times daily with a meal.      carvedilol 6.25 MG tablet   Commonly known as: COREG   Take 2 tablets (12.5 mg total) by mouth 2 (two) times daily with a meal.      cholecalciferol 1000 UNITS tablet   Commonly known as: VITAMIN D   Take 5,000 Units by mouth once a week. On Fridays. Confirmed by family member      docusate sodium 100 MG capsule   Commonly known as: COLACE   Take 100 mg by mouth 2 (two) times daily as needed.      esomeprazole 40 MG capsule   Commonly known as: NEXIUM   Take 40 mg by mouth 2 (two) times daily as needed.      feeding supplement Liqd   Take 237 mLs by mouth 2 (two) times daily between meals.      halobetasol 0.05 % cream   Commonly known as: ULTRAVATE   Apply topically 2 (two) times daily.      HYDROcodone-acetaminophen 5-325 MG per tablet   Commonly known as: NORCO/VICODIN   Take 1-2 tablets by mouth every 6 (six) hours as needed.      irbesartan 150 MG tablet   Commonly known as: AVAPRO   Take 1 tablet (150 mg total) by mouth daily.      meloxicam 7.5 MG tablet   Commonly known as: MOBIC   Take 7.5 mg by mouth 2 (two) times daily.      multivitamin with minerals Tabs   Take 1 tablet by mouth daily.              The results of significant diagnostics from this hospitalization (including imaging, microbiology, ancillary and laboratory) are listed below for reference.    Significant Diagnostic Studies: Ct Abdomen Pelvis Wo Contrast  01/06/2012  *RADIOLOGY REPORT*  Clinical Data: Right flank pain  CT ABDOMEN AND PELVIS WITHOUT CONTRAST  Technique:  Multidetector CT imaging of the abdomen and pelvis was performed following the standard protocol without intravenous contrast.  Comparison: 12/07/2010  Findings: Lung bases are unremarkable.  Sagittal  images of the spine shows osteopenia and extensive generative changes lumbar spine.  Atherosclerotic calcifications are noted abdominal aorta, bilateral renal artery origin, SMA, splenic artery and the iliac arteries.  No aortic aneurysm.  The patient is status post cholecystectomy.  There is a calcification in the right hepatic hilum measures 6 mm.  This probable represents a calcified lymph node.  Less likely a dropped gallstone.  There are colonic diverticula in transverse colon and descending colon.  Colonic diverticula are noted in the right colon.  Multiple sigmoid colon diverticula.  No evidence of acute diverticulitis.  Unenhanced right kidney shows a probable cyst lower pole anterior aspect measures 1.5 cm.  There is  a cyst  in the mid pole anterior aspect of the left kidney measures 2 cm.  Probable cyst lower pole posterior aspect of the left kidney measures 2.8 cm.  Probable cyst mid pole posterior aspect of the left kidney measures 2.1 cm. Nonobstructive calcified calculus in the lower pole of the left kidney measures 2.7 mm.  No calcified ureteral calculi are noted. No hydronephrosis or hydroureter.  A distended urinary bladder is noted.  The uterus is atrophic.  In axial image 55 there is a polypoid increased density material in the right posterior aspect of the urinary bladder.  This is best visualized in the coronal image 59 measures about 1 cm.  Although this may represent a small blood clot a bladder lesion cannot be excluded.  Clinical correlation is necessary.  Further evaluation with cystoscopy could be performed as clinically warranted.  IMPRESSION:  1. The patient is status post cholecystectomy.  A calcification in the hepatic hilum measures 6 mm.  This most likely represents a calcified lymph node rather than a dropped gallstone. 2.  Left nonobstructive nephrolithiasis.  No hydronephrosis or hydroureter.  Bilateral probable renal cysts. 3.  Multiple colonic diverticula without evidence of  acute diverticulitis.  4.  There is a polypoid high density material in the right posterior aspect of the urinary bladder best visualized on coronal image 59 measures about 1 cm.  Although may represent a small blood clot a urinary bladder lesion cannot be excluded.  Clinical correlation is necessary.  Further evaluation cystoscopy could be performed.  Original Report Authenticated By: Natasha Mead, M.D.   Dg Ribs Unilateral W/chest Left  01/17/2012  *RADIOLOGY REPORT*  Clinical Data: Left anterior rib pain after fall.  LEFT RIBS AND CHEST - 3+ VIEW  Comparison: None.  Findings: Cardiac enlargement.  Pulmonary vascularity is normal. Scattered pulmonary fibrosis.  No focal consolidation.  Calcified and tortuous aorta.  No blunting of costophrenic angles.  No pneumothorax.  Rib views demonstrate no acute fracture with mild displacement of the anterolateral left 3rd rib with deformities of the left fourth, fifth, and sixth ribs also probably representing fractures. Nondisplaced fracture of the distal left clavicle.  No focal bone lesion.  IMPRESSION: Nondisplaced left rib fractures, probably multiple.  Nondisplaced fracture of the distal left clavicle.  Cardiac enlargement. Pulmonary fibrosis.  Original Report Authenticated By: Marlon Pel, M.D.   Dg Lumbar Spine Complete  01/17/2012  *RADIOLOGY REPORT*  Clinical Data: Low back pain after fall.  LUMBAR SPINE - COMPLETE 4+ VIEW  Comparison: Lumbar spine 12/07/2010.  CT abdomen and pelvis 01/06/2012.  Findings: Five lumbar type vertebra.  Lumbar scoliosis convex towards the left.  No abnormal anterior subluxation.  Diffuse degenerative changes throughout the lumbar spine with narrowed interspaces and associated endplate hypertrophic changes.  No vertebral compression deformities.  No focal bone lesion or bone destruction.  Bone cortex and trabecular architecture appear intact.  Aortic vascular calcifications.  Surgical clips in the right upper quadrant.  No  significant change since previous study.  IMPRESSION: Diffuse degenerative change and scoliosis of the lumbar spine.  No displaced fractures identified.  Original Report Authenticated By: Marlon Pel, M.D.   Dg Pelvis 1-2 Views  01/17/2012  *RADIOLOGY REPORT*  Clinical Data: Pelvic pain after fall.  PELVIS - 1-2 VIEW  Comparison: CT abdomen and pelvis 01/06/2012  Findings: Visualization of the sacrum and upper pelvis is limited due to bowel gas and stool.  Visualized pelvis, sacrum, SI joints, symphysis pubis, and hips appear intact.  No  displaced fractures are appreciated.  No focal bone lesion or bone destruction. Degenerative changes in the lower lumbar spine and hips.  IMPRESSION: No acute fractures identified.  Original Report Authenticated By: Marlon Pel, M.D.    Labs: Basic Metabolic Panel:  Lab 01/19/12 1610 01/18/12 0430 01/17/12 0856 01/17/12 0620  NA 135 135 -- 128*  K 3.6 3.6 -- 3.6  CL 96 95* -- 88*  CO2 34* 33* -- 30  GLUCOSE 113* 97 -- 109*  BUN 15 13 -- 19  CREATININE 0.65 0.74 -- 0.67  CALCIUM 8.8 9.1 -- 9.3  MG -- -- 1.9 --  PHOS -- -- 3.0 --   CBC:  Lab 01/19/12 0430 01/18/12 0430 01/17/12 0620  WBC 7.6 6.4 12.2*  NEUTROABS -- -- 10.4*  HGB 10.0* 10.1* 11.6*  HCT 29.6* 29.4* 32.7*  MCV 91.1 89.6 87.9  PLT 159 152 167   CBG:  Lab 01/19/12 0745 01/18/12 0651  GLUCAP 112* 80    Time coordinating discharge: > 30 minutes  Signed:  Greysyn Vanderberg  [facility] 01/19/2012, 12:50 PM

## 2012-01-20 MED ORDER — HYDROCODONE-ACETAMINOPHEN 5-325 MG PO TABS: 1.0000 | ORAL_TABLET | Freq: Four times a day (QID) | ORAL | Status: DC | PRN

## 2012-01-20 MED ORDER — ALPRAZOLAM 0.5 MG PO TABS: 0.5000 mg | ORAL_TABLET | Freq: Every evening | ORAL | Status: DC | PRN

## 2012-01-20 MED FILL — Medication: Qty: 1 | Status: AC

## 2012-01-20 NOTE — Progress Notes (Signed)
Physical Therapy Treatment Patient Details Name: Briana Schultz MRN: [id number] DOB: July 20, 1912 Today's Date: 01/20/2012 Time: 6578-4696 PT Time Calculation (min): 20 min  PT Assessment / Plan / Recommendation Comments on Treatment Session  Pt states she feels better but very tiered.      Follow Up Recommendations  Skilled nursing facility    Barriers to Discharge        Equipment Recommendations  Defer to next venue    Recommendations for Other Services    Frequency Min 3X/week   Plan Discharge plan remains appropriate    Precautions / Restrictions Precautions Precautions: Fall Precaution Comments: L rib fractures and L clavicle fracture Restrictions Weight Bearing Restrictions: No   Pertinent Vitals/Pain     Mobility  Bed Mobility Bed Mobility: Not assessed Supine to Sit: 5: Supervision;HOB elevated Details for Bed Mobility Assistance: Pt OOB in recliner  Transfers Transfers: Sit to Stand;Stand to Sit Sit to Stand: 4: Min assist;With upper extremity assist;From chair/3-in-1;From toilet Stand to Sit: 4: Min guard;To chair/3-in-1 Details for Transfer Assistance: 25% VC's for hand placement and turn completion  Ambulation/Gait Ambulation/Gait Assistance: 4: Min assist Ambulation Distance (Feet): 125 Feet Assistive device: Rolling walker Ambulation/Gait Assistance Details: unsteady gait with 50% VC's on safety with turns Gait Pattern: Step-through pattern;Trunk flexed;Decreased stride length;Shuffle Gait velocity: decreased     PT Goals     progressing   Visit Information  Last PT Received On: 01/20/12 Assistance Needed: +1    Subjective Data      Cognition  Overall Cognitive Status: Appears within functional limits for tasks assessed/performed Arousal/Alertness: Awake/alert Orientation Level: Appears intact for tasks assessed Behavior During Session: [facility] for tasks performed    Balance  Poor +  to fair   End of Session PT - End of Session Equipment  Utilized During Treatment: Gait belt Activity Tolerance: Patient tolerated treatment well Patient left: in chair;with call bell/phone within reach;with family/visitor present Nurse Communication: Mobility status;Other (comment) (Nurse observed pt amb in hallway)   Felecia Shelling  PTA [facility]  Acute  Rehab Pager     [phone]

## 2012-01-20 NOTE — Progress Notes (Signed)
Patient cleared for discharge. [facility]ed to [facility] [place]. Daughter to transport. Packet copied.  Sheala Dosh C. Tinamarie Przybylski MSW, LCSW [phone]

## 2012-01-20 NOTE — Progress Notes (Signed)
Occupational Therapy Treatment Patient Details Name: Briana Schultz MRN: [id number] DOB: 1913-01-18 Today's Date: 01/20/2012 Time: 4098-1191 OT Time Calculation (min): 32 min  OT Assessment / Plan / Recommendation Comments on Treatment Session pt very fatigued after several attempts to clean after BM and sitting on commode for at least 10 minutes. Plan is for SNF and per nursing to discharge today.     Follow Up Recommendations  Skilled nursing facility    Barriers to Discharge       Equipment Recommendations  Defer to next venue    Recommendations for Other Services    Frequency Min 2X/week   Plan Discharge plan remains appropriate    Precautions / Restrictions Precautions Precautions: Fall Precaution Comments: L rib fractures and L clavicle fracture        ADL  Grooming: Performed;Wash/dry hands;Set up Where Assessed - Grooming: Supported sitting Toilet Transfer: Performed;Minimal Dentist Method: Surveyor, minerals: Materials engineer and Hygiene: Simulated;Minimal assistance Where Assessed - Engineer, mining and Hygiene: Sit to stand from 3-in-1 or toilet ADL Comments: stand pivot from bed to [facility] then to chair. Pt sat on commode and was having urge to have BM but stated bowels felt hard. Pt able to wipe out several harder balls of bowel once part of the way out. Nursing made aware.     OT Diagnosis:    OT Problem List:   OT Treatment Interventions:     OT Goals ADL Goals ADL Goal: Grooming - Progress: Progressing toward goals ADL Goal: Toilet Transfer - Progress: Progressing toward goals ADL Goal: Toileting - Clothing Manipulation - Progress: Progressing toward goals  Visit Information  Last OT Received On: 01/20/12 Assistance Needed: +1    Subjective Data  Subjective: I need to try to have a bowel movement Patient Stated Goal: to get up better   Prior Functioning         Cognition  Overall Cognitive Status: Appears within functional limits for tasks assessed/performed Arousal/Alertness: Awake/alert Orientation Level: Appears intact for tasks assessed Behavior During Session: [facility] for tasks performed    Mobility Bed Mobility Bed Mobility: Supine to Sit Supine to Sit: 5: Supervision;HOB elevated Transfers Transfers: Sit to Stand;Stand to Sit Sit to Stand: 4: Min assist;With upper extremity assist;From chair/3-in-1;From bed Stand to Sit: 4: Min assist;With upper extremity assist;To chair/3-in-1 Details for Transfer Assistance: min verbal cues for hand placement and backing up completely to surface   Exercises    Balance Balance Balance Assessed: Yes Dynamic Standing Balance Dynamic Standing - Level of Assistance: 4: Min assist;Other (comment) (to perform posterior hygiene)  End of Session OT - End of Session Activity Tolerance: Patient limited by pain;Patient limited by fatigue Patient left: in chair;with call bell/phone within reach;with nursing in room  GO     Lennox Laity [phone] 01/20/2012, 10:27 AM

## 2012-01-20 NOTE — Progress Notes (Signed)
Patient seen and examined; no acute issues overnight. Patient pain well controlled on oral regimen. Plan continue as described on my A/P from discharge document yesterday; please refer to it for details. No bed at [facility] Will try to get her to a different facility today.  Briana Schultz [phone]

## 2012-01-20 NOTE — Progress Notes (Signed)
[facility] has no bed available. CSW faxed to other facilities. Seth Higginbotham C. Joyel Chenette MSW, LCSW [phone]

## 2012-01-21 ENCOUNTER — Inpatient Hospital Stay (HOSPITAL_COMMUNITY)
Admission: AD | Admit: 2012-01-21 | Discharge: 2012-01-24 | DRG: 392 | Disposition: A | Discharge status: Skilled Nursing Facility | Payer: Medicare Other | Source: Ambulatory Visit | Attending: Internal Medicine | Admitting: Internal Medicine

## 2012-01-21 ENCOUNTER — Encounter (HOSPITAL_COMMUNITY): Payer: Self-pay | Admitting: Internal Medicine

## 2012-01-21 DIAGNOSIS — E46 Unspecified protein-calorie malnutrition: Secondary | ICD-10-CM | POA: Diagnosis present

## 2012-01-21 DIAGNOSIS — N179 Acute kidney failure, unspecified: Secondary | ICD-10-CM | POA: Diagnosis present

## 2012-01-21 DIAGNOSIS — D649 Anemia, unspecified: Secondary | ICD-10-CM | POA: Diagnosis present

## 2012-01-21 DIAGNOSIS — R0902 Hypoxemia: Secondary | ICD-10-CM | POA: Diagnosis present

## 2012-01-21 DIAGNOSIS — W19XXXA Unspecified fall, initial encounter: Secondary | ICD-10-CM

## 2012-01-21 DIAGNOSIS — R5381 Other malaise: Secondary | ICD-10-CM | POA: Diagnosis present

## 2012-01-21 DIAGNOSIS — D638 Anemia in other chronic diseases classified elsewhere: Secondary | ICD-10-CM | POA: Diagnosis present

## 2012-01-21 DIAGNOSIS — N390 Urinary tract infection, site not specified: Secondary | ICD-10-CM | POA: Diagnosis present

## 2012-01-21 DIAGNOSIS — I10 Essential (primary) hypertension: Secondary | ICD-10-CM | POA: Diagnosis present

## 2012-01-21 DIAGNOSIS — N3289 Other specified disorders of bladder: Secondary | ICD-10-CM | POA: Diagnosis present

## 2012-01-21 DIAGNOSIS — K5900 Constipation, unspecified: Principal | ICD-10-CM | POA: Diagnosis present

## 2012-01-21 DIAGNOSIS — E871 Hypo-osmolality and hyponatremia: Secondary | ICD-10-CM | POA: Diagnosis present

## 2012-01-21 DIAGNOSIS — R338 Other retention of urine: Secondary | ICD-10-CM | POA: Diagnosis present

## 2012-01-21 DIAGNOSIS — K219 Gastro-esophageal reflux disease without esophagitis: Secondary | ICD-10-CM | POA: Diagnosis present

## 2012-01-21 DIAGNOSIS — R339 Retention of urine, unspecified: Secondary | ICD-10-CM | POA: Diagnosis present

## 2012-01-21 DIAGNOSIS — N329 Bladder disorder, unspecified: Secondary | ICD-10-CM | POA: Diagnosis present

## 2012-01-21 LAB — CBC
Hemoglobin: 10.8 g/dL — ABNORMAL LOW (ref 12.0–15.0)
Platelets: 215 10*3/uL (ref 150–400)
RBC: 3.33 MIL/uL — ABNORMAL LOW (ref 3.87–5.11)
WBC: 7.9 10*3/uL (ref 4.0–10.5)

## 2012-01-21 LAB — COMPREHENSIVE METABOLIC PANEL
AST: 23 U/L (ref 0–37)
Albumin: 3.2 g/dL — ABNORMAL LOW (ref 3.5–5.2)
Chloride: 89 mEq/L — ABNORMAL LOW (ref 96–112)
Creatinine, Ser: 1.11 mg/dL — ABNORMAL HIGH (ref 0.50–1.10)
Total Bilirubin: 0.8 mg/dL (ref 0.3–1.2)
Total Protein: 6.6 g/dL (ref 6.0–8.3)

## 2012-01-21 LAB — DIFFERENTIAL
Lymphocytes Relative: 10 % — ABNORMAL LOW (ref 12–46)
Lymphs Abs: 0.8 10*3/uL (ref 0.7–4.0)
Monocytes Relative: 10 % (ref 3–12)
Neutro Abs: 6.1 10*3/uL (ref 1.7–7.7)
Neutrophils Relative %: 77 % (ref 43–77)

## 2012-01-21 LAB — URINALYSIS, ROUTINE W REFLEX MICROSCOPIC
Glucose, UA: NEGATIVE mg/dL
Protein, ur: NEGATIVE mg/dL
Specific Gravity, Urine: 1.021 (ref 1.005–1.030)

## 2012-01-21 LAB — PROTIME-INR
INR: 0.95 (ref 0.00–1.49)
Prothrombin Time: 12.9 seconds (ref 11.6–15.2)

## 2012-01-21 LAB — URINE MICROSCOPIC-ADD ON

## 2012-01-21 LAB — APTT: aPTT: 30 seconds (ref 24–37)

## 2012-01-21 MED ORDER — ADULT MULTIVITAMIN W/MINERALS CH
1.0000 | ORAL_TABLET | Freq: Every day | ORAL | Status: DC
  Administered 2012-01-22 – 2012-01-24 (×3): 1 via ORAL
  Filled 2012-01-21 (×3): qty 1

## 2012-01-21 MED ORDER — DOCUSATE SODIUM 100 MG PO CAPS
100.0000 mg | ORAL_CAPSULE | Freq: Two times a day (BID) | ORAL | Status: DC | PRN
  Filled 2012-01-21: qty 1

## 2012-01-21 MED ORDER — MELOXICAM 7.5 MG PO TABS
7.5000 mg | ORAL_TABLET | Freq: Two times a day (BID) | ORAL | Status: DC
  Administered 2012-01-21 – 2012-01-22 (×2): 7.5 mg via ORAL
  Filled 2012-01-21 (×3): qty 1

## 2012-01-21 MED ORDER — CALCIUM CARBONATE 1250 (500 CA) MG PO TABS
1.0000 | ORAL_TABLET | Freq: Two times a day (BID) | ORAL | Status: DC
  Administered 2012-01-22 – 2012-01-24 (×5): 500 mg via ORAL
  Filled 2012-01-21 (×7): qty 1

## 2012-01-21 MED ORDER — SODIUM CHLORIDE 0.9 % IV SOLN: 250.0000 mL | INTRAVENOUS | Status: DC | PRN

## 2012-01-21 MED ORDER — ALPRAZOLAM 0.5 MG PO TABS
0.5000 mg | ORAL_TABLET | Freq: Every evening | ORAL | Status: DC | PRN
  Filled 2012-01-21: qty 1

## 2012-01-21 MED ORDER — CALCIUM CARBONATE 600 MG PO TABS
600.0000 mg | ORAL_TABLET | Freq: Two times a day (BID) | ORAL | Status: DC
  Filled 2012-01-21: qty 1

## 2012-01-21 MED ORDER — SODIUM CHLORIDE 0.9 % IJ SOLN
3.0000 mL | Freq: Two times a day (BID) | INTRAMUSCULAR | Status: DC
  Administered 2012-01-21 – 2012-01-24 (×3): 3 mL via INTRAVENOUS

## 2012-01-21 MED ORDER — PANTOPRAZOLE SODIUM 40 MG PO TBEC
40.0000 mg | DELAYED_RELEASE_TABLET | Freq: Every day | ORAL | Status: DC
  Administered 2012-01-21 – 2012-01-24 (×4): 40 mg via ORAL
  Filled 2012-01-21 (×4): qty 1

## 2012-01-21 MED ORDER — SODIUM CHLORIDE 0.9 % IV SOLN
250.0000 mL | INTRAVENOUS | Status: DC | PRN
  Administered 2012-01-21 – 2012-01-22 (×2): 1000 mL via INTRAVENOUS

## 2012-01-21 MED ORDER — IRBESARTAN 150 MG PO TABS
150.0000 mg | ORAL_TABLET | Freq: Every day | ORAL | Status: DC
  Administered 2012-01-21 – 2012-01-22 (×2): 150 mg via ORAL
  Filled 2012-01-21 (×2): qty 1

## 2012-01-21 MED ORDER — HYDROCODONE-ACETAMINOPHEN 5-325 MG PO TABS
1.0000 | ORAL_TABLET | Freq: Four times a day (QID) | ORAL | Status: DC | PRN
  Administered 2012-01-21 – 2012-01-24 (×9): 1 via ORAL
  Filled 2012-01-21 (×2): qty 1
  Filled 2012-01-21: qty 2
  Filled 2012-01-21 (×5): qty 1

## 2012-01-21 MED ORDER — GLUCERNA SHAKE PO LIQD
237.0000 mL | Freq: Two times a day (BID) | ORAL | Status: DC
  Administered 2012-01-22 – 2012-01-24 (×6): 237 mL via ORAL
  Filled 2012-01-21 (×6): qty 237

## 2012-01-21 MED ORDER — SODIUM CHLORIDE 0.9 % IJ SOLN: 3.0000 mL | INTRAMUSCULAR | Status: DC | PRN

## 2012-01-21 MED ORDER — HALOBETASOL PROPIONATE 0.05 % EX CREA: TOPICAL_CREAM | Freq: Two times a day (BID) | CUTANEOUS | Status: DC

## 2012-01-21 MED ORDER — VITAMIN D3 25 MCG (1000 UNIT) PO TABS: 5000.0000 [IU] | ORAL_TABLET | ORAL | Status: DC

## 2012-01-21 NOTE — H&P (Addendum)
Briana Schultz is an 78 y.o. female.   Pcp: Benedetto Goad  Chief Complaint: urinary retention HPI: 78 yo female with hypertension, recent hyponatremia, anemia has urinary retention  Pt has not urinated for about 16 hours according to the family.  Pt has general weakness and deconditioning.  Pt denies dysuria, hematuria, n/v, flank pain.  Pt will be admitted for urinary retention, deconditioning, general weakness and failure to thrive.     Past Medical History  Diagnosis Date  . Fall     secondary to weakness as of  [place] OV  03/26/10  . H/O orthostatic hypotension   . Chest discomfort     anterior  . Hypertension   . Aortic valve stenosis     mild to moderate  . Syncope and collapse 2006  . Arthritis   . GERD (gastroesophageal reflux disease)   . Osteoporosis   . Abnormal EKG   . Scarlet fever   . Scoliosis     Past Surgical History  Procedure Date  . Cholecystectomy     Family History  Problem Relation Age of Onset  . Pneumonia Mother   . Heart failure Father   . Diabetes Father   . Heart disease Sister    Social History:  reports that she quit smoking about 23 years ago. She has never used smokeless tobacco. She reports that she does not drink alcohol or use illicit drugs.  Allergies: No Known Allergies  Medications Prior to Admission  Medication Sig Dispense Refill  . calcium carbonate (OS-CAL) 600 MG TABS Take 600 mg by mouth 2 (two) times daily with a meal.      . ALPRAZolam (XANAX) 0.5 MG tablet Take 1 tablet (0.5 mg total) by mouth at bedtime as needed.  30 tablet  0  . amLODipine (NORVASC) 5 MG tablet Take 2 tablets (10 mg total) by mouth daily.      . carvedilol (COREG) 6.25 MG tablet Take 2 tablets (12.5 mg total) by mouth 2 (two) times daily with a meal.      . cholecalciferol (VITAMIN D) 1000 UNITS tablet Take 5,000 Units by mouth once a week. On Fridays. Confirmed by family member      . docusate sodium (COLACE) 100 MG capsule Take 100 mg by mouth 2  (two) times daily as needed.      Marland Kitchen esomeprazole (NEXIUM) 40 MG capsule Take 40 mg by mouth 2 (two) times daily as needed.      . feeding supplement (GLUCERNA SHAKE) LIQD Take 237 mLs by mouth 2 (two) times daily between meals.      . halobetasol (ULTRAVATE) 0.05 % cream Apply topically 2 (two) times daily.      Marland Kitchen HYDROcodone-acetaminophen (NORCO/VICODIN) 5-325 MG per tablet Take 1-2 tablets by mouth every 6 (six) hours as needed.  30 tablet  0  . irbesartan (AVAPRO) 150 MG tablet Take 1 tablet (150 mg total) by mouth daily.      . meloxicam (MOBIC) 7.5 MG tablet Take 7.5 mg by mouth 2 (two) times daily.       . Multiple Vitamin (MULTIVITAMIN WITH MINERALS) TABS Take 1 tablet by mouth daily.        Results for orders placed during the hospital encounter of 01/17/12 (from the past 48 hour(s))  GLUCOSE, CAPILLARY     Status: Normal   Collection Time   01/20/12  7:40 AM      Component Value Range Comment   Glucose-Capillary 99  70 -  99 mg/dL    No results found.  Review of Systems  Constitutional: Negative for fever, chills, weight loss, malaise/fatigue and diaphoresis.  HENT: Negative for hearing loss, ear pain, nosebleeds, congestion, neck pain, tinnitus and ear discharge.   Eyes: Negative for blurred vision, double vision, photophobia, pain and discharge.  Respiratory: Negative for cough, hemoptysis, sputum production and shortness of breath.   Cardiovascular: Negative for chest pain, palpitations, orthopnea, claudication, leg swelling and PND.  Gastrointestinal: Negative for heartburn, nausea, vomiting, abdominal pain, diarrhea, constipation, blood in stool and melena.  Genitourinary: Negative for dysuria, urgency, frequency and hematuria.       + urinary retention  Musculoskeletal: Negative for myalgias, back pain and joint pain.  Skin: Negative for itching and rash.  Neurological: Negative for dizziness, tingling, tremors, sensory change, speech change, focal weakness, seizures,  weakness and headaches.  Endo/Heme/Allergies: Negative for environmental allergies and polydipsia. Does not bruise/bleed easily.  Psychiatric/Behavioral: Negative for depression, suicidal ideas, hallucinations, memory loss and substance abuse. The patient is not nervous/anxious and does not have insomnia.     There were no vitals taken for this visit. Physical Exam  Constitutional: She is oriented to person, place, and time. She appears well-developed and well-nourished.  HENT:  Head: Normocephalic and atraumatic.  Mouth/Throat: No oropharyngeal exudate.  Eyes: Pupils are equal, round, and reactive to light. Right eye exhibits no discharge. Left eye exhibits no discharge. No scleral icterus.  Neck: Normal range of motion. Neck supple. No JVD present. No tracheal deviation present. No thyromegaly present.  Cardiovascular: Normal rate, regular rhythm and normal heart sounds.  Exam reveals no gallop and no friction rub.   No murmur heard. Respiratory: Effort normal and breath sounds normal. No stridor. No respiratory distress. She has no wheezes. She has no rales. She exhibits no tenderness.  GI: Soft. Bowel sounds are normal. She exhibits distension. She exhibits no mass. There is no tenderness. There is no rebound and no guarding.  Musculoskeletal: Normal range of motion. She exhibits no edema and no tenderness.  Lymphadenopathy:    She has no cervical adenopathy.  Neurological: She is alert and oriented to person, place, and time. She has normal reflexes. She displays normal reflexes. No cranial nerve deficit. She exhibits normal muscle tone. Coordination normal.  Skin: Skin is warm and dry. No rash noted. No erythema. No pallor.       Multiple skin tears on forearms  Psychiatric: She has a normal mood and affect. Her behavior is normal. Judgment and thought content normal.     Assessment/Plan Urinary retention ?Bladder mass seen on CT scan FTT General weakness and  deconditioning Hypertension H/o hyponatremia due to hydrochlorothiazide Gerd Anemia  Straight cath.  Check cbc, cmp, urinalysis Consider urology consultation in am Continue on present bp medications Cont ppi for gerd Will contact pcp office on Monday to find out what type of anemia w/up has been done in the past   Jana Half 01/21/2012, 6:45 PM

## 2012-01-21 NOTE — Progress Notes (Signed)
Paged on call admitting NP from [facility], notified them of pt's arrival

## 2012-01-22 DIAGNOSIS — N3289 Other specified disorders of bladder: Secondary | ICD-10-CM | POA: Diagnosis present

## 2012-01-22 DIAGNOSIS — K219 Gastro-esophageal reflux disease without esophagitis: Secondary | ICD-10-CM | POA: Diagnosis present

## 2012-01-22 DIAGNOSIS — R339 Retention of urine, unspecified: Secondary | ICD-10-CM

## 2012-01-22 DIAGNOSIS — E46 Unspecified protein-calorie malnutrition: Secondary | ICD-10-CM | POA: Diagnosis present

## 2012-01-22 DIAGNOSIS — N390 Urinary tract infection, site not specified: Secondary | ICD-10-CM | POA: Diagnosis present

## 2012-01-22 DIAGNOSIS — I10 Essential (primary) hypertension: Secondary | ICD-10-CM | POA: Diagnosis present

## 2012-01-22 DIAGNOSIS — D649 Anemia, unspecified: Secondary | ICD-10-CM | POA: Diagnosis present

## 2012-01-22 DIAGNOSIS — N179 Acute kidney failure, unspecified: Secondary | ICD-10-CM | POA: Diagnosis present

## 2012-01-22 LAB — OSMOLALITY, URINE: Osmolality, Ur: 350 mOsm/kg — ABNORMAL LOW (ref 390–1090)

## 2012-01-22 LAB — TSH: TSH: 1.335 u[IU]/mL (ref 0.350–4.500)

## 2012-01-22 LAB — CORTISOL: Cortisol, Plasma: 14 ug/dL

## 2012-01-22 MED ORDER — SODIUM CHLORIDE 0.9 % IV SOLN: INTRAVENOUS | Status: DC

## 2012-01-22 MED ORDER — ERYTHROMYCIN 5 MG/GM OP OINT
TOPICAL_OINTMENT | Freq: Three times a day (TID) | OPHTHALMIC | Status: DC
  Administered 2012-01-22 – 2012-01-24 (×5): via OPHTHALMIC
  Filled 2012-01-22: qty 3.5

## 2012-01-22 MED ORDER — HYDRALAZINE HCL 20 MG/ML IJ SOLN
5.0000 mg | Freq: Four times a day (QID) | INTRAMUSCULAR | Status: DC | PRN
  Administered 2012-01-23: 5 mg via INTRAVENOUS
  Filled 2012-01-22 (×2): qty 1

## 2012-01-22 MED ORDER — DOCUSATE SODIUM 100 MG PO CAPS
100.0000 mg | ORAL_CAPSULE | Freq: Two times a day (BID) | ORAL | Status: DC
  Administered 2012-01-22 – 2012-01-24 (×4): 100 mg via ORAL
  Filled 2012-01-22 (×6): qty 1

## 2012-01-22 MED ORDER — BISACODYL 10 MG RE SUPP
10.0000 mg | Freq: Every day | RECTAL | Status: DC | PRN
  Administered 2012-01-22: 10 mg via RECTAL
  Filled 2012-01-22: qty 1

## 2012-01-22 MED ORDER — PANTOPRAZOLE SODIUM 40 MG PO TBEC: 40.0000 mg | DELAYED_RELEASE_TABLET | Freq: Every day | ORAL | Status: DC

## 2012-01-22 NOTE — Progress Notes (Signed)
[facility] PROGRESS NOTE  Briana Schultz WJX:[id number] DOB: 04-16-1913 DOA: 01/21/2012 PCP: Pamelia Hoit, MD  Assessment/Plan: Principal Problem:  *Urinary retention  Seems to be improved.  Likely triggered by constipation.  Urinating well. Active Problems:  Acute renal failure  Hold Mobic and Avapro.  Continue to hydrate.  Hyponatremia  Gently hydrate.  UTI (lower urinary tract infection)  U/A shows no nitrates.  GERD (gastroesophageal reflux disease)  Continue PPI.  Normocytic anemia  Likely anemia of chronic disease.  Bladder mass  Urology consulted.  HTN (hypertension)  Continue Avapro.  Protein calorie malnutrition  Glucerna supplements.  Code Status: Full. Family Communication: Daughter updated at bedside. Disposition Plan: SNF when stable.   Brief narrative: Briana Schultz is a 78 year old woman who was originally hospitalized with a fall on 01/17/12.  She was discharged to a SNF on 01/20/12.  She was sent back to the hospital due to urinary retention.  Medical Consultants:  Dr. Mena Goes, Urology  Other Consultants:  Physical therapy  Occupational therapy  Procedures:  None.  Antibiotics:  None.  HPI/Subjective: Briana Schultz is complaining of constipation.  She was recently impacted and had to be disimpacted 2 days ago.  Her tail bone is sore.  No N/V.  No chest pain or dyspnea.  Objective: Filed Vitals:   01/21/12 2032 01/21/12 2320 01/22/12 0529 01/22/12 1015  BP: 114/45  149/52 127/63  Pulse: 63 80 72 60  Temp: 97.5 F (36.4 C)  97.5 F (36.4 C)   TempSrc: Oral  Oral   Resp: 18  18   Height:      Weight:   46.7 kg (102 lb 15.3 oz)   SpO2: 94%  93%     Intake/Output Summary (Last 24 hours) at 01/22/12 1422 Last data filed at 01/22/12 0830  Gross per 24 hour  Intake 1382.1 ml  Output   1130 ml  Net  252.1 ml    Exam: Gen:  NAD Cardiovascular:  RRR, No M/R/G Respiratory: Lungs CTAB Gastrointestinal: Abdomen  soft, NT/ND with normal active bowel sounds. Extremities: No C/E/C  Data Reviewed: Basic Metabolic Panel:  Lab 01/21/12 2130 01/19/12 0430 01/18/12 0430 01/17/12 0856 01/17/12 0620  NA 131* 135 135 -- 128*  K 3.5 3.6 -- -- --  CL 89* 96 95* -- 88*  CO2 34* 34* 33* -- 30  GLUCOSE 136* 113* 97 -- 109*  BUN 31* 15 13 -- 19  CREATININE 1.11* 0.65 0.74 -- 0.67  CALCIUM 9.2 8.8 9.1 -- 9.3  MG -- -- -- 1.9 --  PHOS -- -- -- 3.0 --   GFR Estimated Creatinine Clearance: 21.4 ml/min (by C-G formula based on Cr of 1.11). Liver Function Tests:  Lab 01/21/12 2010  AST 23  ALT 22  ALKPHOS 79  BILITOT 0.8  PROT 6.6  ALBUMIN 3.2*   Coagulation profile  Lab 01/21/12 2010  INR 0.95  PROTIME --    CBC:  Lab 01/21/12 2010 01/19/12 0430 01/18/12 0430 01/17/12 0620  WBC 7.9 7.6 6.4 12.2*  NEUTROABS 6.1 -- -- 10.4*  HGB 10.8* 10.0* 10.1* 11.6*  HCT 30.6* 29.6* 29.4* 32.7*  MCV 91.9 91.1 89.6 87.9  PLT 215 159 152 167   CBG:  Lab 01/20/12 0740 01/19/12 0745 01/18/12 0651  GLUCAP 99 112* 80   Urinalysis    Component Value Date/Time   COLORURINE YELLOW 01/21/2012 1910   APPEARANCEUR CLOUDY* 01/21/2012 1910   LABSPEC 1.021 01/21/2012 1910   PHURINE 5.5 01/21/2012  1910   GLUCOSEU NEGATIVE 01/21/2012 1910   HGBUR TRACE* 01/21/2012 1910   BILIRUBINUR NEGATIVE 01/21/2012 1910   KETONESUR NEGATIVE 01/21/2012 1910   PROTEINUR NEGATIVE 01/21/2012 1910   UROBILINOGEN 0.2 01/21/2012 1910   NITRITE NEGATIVE 01/21/2012 1910   LEUKOCYTESUR SMALL* 01/21/2012 1910     Studies: None.  Scheduled Meds:    . calcium carbonate  1 tablet Oral BID WC  . cholecalciferol  5,000 Units Oral Weekly  . feeding supplement  237 mL Oral BID BM  . halobetasol   Topical BID  . irbesartan  150 mg Oral Daily  . meloxicam  7.5 mg Oral BID  . multivitamin with minerals  1 tablet Oral Daily  . pantoprazole  40 mg Oral Daily  . sodium chloride  3 mL Intravenous Q12H  . DISCONTD: calcium carbonate  600 mg  Oral BID WC   Continuous Infusions:   Time spent: 35 minutes.   LOS: 1 day   RAMA,CHRISTINA  [facility] Pager [phone].  If 8PM-8AM, please contact night-coverage at [email or web address], password [facility] 01/22/2012, 2:22 PM

## 2012-01-22 NOTE — Progress Notes (Signed)
Subjective: Pt was able to urinate.  Uti: denies dysuria, increase in frequency, flank pain Bladder mass ? On CT scan,  No gross hematuria Anemia: denies brbpr, black stool  Objective: Vital signs in last 24 hours: Temp:  [97.5 F (36.4 C)] 97.5 F (36.4 C) (07/20 0529) Pulse Rate:  [59-72] 72  (07/20 0529) Resp:  [18] 18  (07/20 0529) BP: (105-149)/(36-52) 149/52 mmHg (07/20 0529) SpO2:  [92 %-94 %] 93 % (07/20 0529) Weight:  [45.7 kg (100 lb 12 oz)-46.7 kg (102 lb 15.3 oz)] 46.7 kg (102 lb 15.3 oz) (07/20 0529) Weight change:     Intake/Output from previous day: 07/19 0701 - 07/20 0700 In: 363 [P.O.:360; I.V.:3] Out: 730 [Urine:730] Intake/Output this shift: Total I/O In: 363 [P.O.:360; I.V.:3] Out: 700 [Urine:700]  Heent: anicteric Neck: no jvd Heart: rrr s1, s2 Lung: ctab Abd: soft, nt, nd, +bs Ext: no c/c/e Skin : evidence of prior skin tears  Lab Results:  Basename 01/21/12 2010  WBC 7.9  HGB 10.8*  HCT 30.6*  PLT 215   BMET  Basename 01/21/12 2010  NA 131*  K 3.5  CL 89*  CO2 34*  GLUCOSE 136*  BUN 31*  CREATININE 1.11*  CALCIUM 9.2    Studies/Results: No results found.  Medications: I have reviewed the patient's current medications.  Assessment/Plan: Urinary retention resolved Uti: Levaquin 500mg  iv qday ?bladder mass,  urology Hyponatremia: check serum osm, cortisol, tsh, urine osm, urine sodium Dehydration: continue with normal saline Anemia: stable, expand database to find out if has had prior colonsocpy with pcp on Monday  LOS: 1 day   Pearson Grippe 01/22/2012, 6:52 AM

## 2012-01-22 NOTE — Consult Note (Signed)
Consult: bladder mass Requested by Dr. Darnelle Catalan  History of Present Illness:  Patient was admitted after fall and broken ribs. She has been in rehab. Pt has general weakness and deconditioning. Patient and patient's daughter contributed to history.   She also had recent hyponatremia, anemia and urinary retention Pt has not urinated for about 16 hours according to the family. Pt has general weakness and deconditioning.   CT July 2013 showed a papillary bladder tumor on right posterior bladder. CT June 2012 shows this same lesion, but it is smaller in 2012. I reviewed the images of both studies. There was no hydronephrosis.   She has no GU history or surgery. Pt denies dysuria, hematuria, n/v, flank pain. She quit smoking about 40 yrs ago and has no exposure to chemo and radiation.   She is urinating much better today.    Past Medical History  Diagnosis Date  . Fall     secondary to weakness as of  [place] OV  03/26/10  . H/O orthostatic hypotension   . Chest discomfort     anterior  . Hypertension   . Aortic valve stenosis     mild to moderate  . Syncope and collapse 2006  . Arthritis   . GERD (gastroesophageal reflux disease)   . Osteoporosis   . Abnormal EKG   . Scarlet fever   . Scoliosis    Past Surgical History  Procedure Date  . Cholecystectomy     Home Medications:  Prescriptions prior to admission  Medication Sig Dispense Refill  . ALPRAZolam (XANAX) 0.5 MG tablet Take 0.5 mg by mouth at bedtime as needed. insomnia      . amLODipine (NORVASC) 10 MG tablet Take 10 mg by mouth daily.      . calcium carbonate (OS-CAL) 600 MG TABS Take 600 mg by mouth 2 (two) times daily with a meal.      . carvedilol (COREG) 25 MG tablet Take 25 mg by mouth 2 (two) times daily with a meal.      . cholecalciferol (VITAMIN D) 1000 UNITS tablet Take 5,000 Units by mouth once a week. On Fridays. Confirmed by family member      . docusate sodium (COLACE) 100 MG capsule Take 100 mg by mouth 2  (two) times daily as needed.      . feeding supplement (GLUCERNA SHAKE) LIQD Take 237 mLs by mouth 2 (two) times daily between meals.      . halobetasol (ULTRAVATE) 0.05 % cream Apply topically 2 (two) times daily.      Marland Kitchen HYDROcodone-acetaminophen (NORCO/VICODIN) 5-325 MG per tablet Take 1-2 tablets by mouth every 6 (six) hours as needed.  30 tablet  0  . irbesartan (AVAPRO) 150 MG tablet Take 150 mg by mouth daily.      . meloxicam (MOBIC) 7.5 MG tablet Take 7.5 mg by mouth 2 (two) times daily.       . Multiple Vitamin (MULTIVITAMIN WITH MINERALS) TABS Take 1 tablet by mouth daily.      Marland Kitchen omeprazole (PRILOSEC) 20 MG capsule Take 20 mg by mouth daily.      . sodium phosphate Pediatric (FLEET) 3.5-9.5 GM/59ML enema Place 1 enema rectally once.      Marland Kitchen DISCONTD: ALPRAZolam (XANAX) 0.5 MG tablet Take 1 tablet (0.5 mg total) by mouth at bedtime as needed.  30 tablet  0  . DISCONTD: feeding supplement (GLUCERNA SHAKE) LIQD Take 237 mLs by mouth 2 (two) times daily between meals.      Marland Kitchen  DISCONTD: irbesartan (AVAPRO) 150 MG tablet Take 1 tablet (150 mg total) by mouth daily.       Allergies: No Known Allergies  Family History  Problem Relation Age of Onset  . Pneumonia Mother   . Heart failure Father   . Diabetes Father   . Heart disease Sister    Social History:  reports that she quit smoking about 23 years ago. She has never used smokeless tobacco. She reports that she does not drink alcohol or use illicit drugs.  ROS: A complete review of systems was performed.  All systems are negative except for pertinent findings as noted   Physical Exam:  Vital signs in last 24 hours: Temp:  [97.5 F (36.4 C)] 97.5 F (36.4 C) (07/20 0529) Pulse Rate:  [59-80] 60  (07/20 1015) Resp:  [18] 18  (07/20 0529) BP: (105-149)/(36-63) 127/63 mmHg (07/20 1015) SpO2:  [92 %-94 %] 93 % (07/20 0529) Weight:  [45.7 kg (100 lb 12 oz)-46.7 kg (102 lb 15.3 oz)] 46.7 kg (102 lb 15.3 oz) (07/20 0529) General:   Alert and oriented, No acute distress. Watching TV, sitting in chair HEENT: Normocephalic, atraumatic Neck: No JVD or lymphadenopathy Cardiovascular: Regular rate and rhythm Lungs: Regular rate and effort Abdomen: Soft, nontender, nondistended, no abdominal masses Back: No CVA tenderness Extremities: No edema Neurologic: Grossly intact  Laboratory Data:  Results for orders placed during the hospital encounter of 01/21/12 (from the past 24 hour(s))  URINALYSIS, ROUTINE W REFLEX MICROSCOPIC     Status: Abnormal   Collection Time   01/21/12  7:10 PM      Component Value Range   Color, Urine YELLOW  YELLOW   APPearance CLOUDY (*) CLEAR   Specific Gravity, Urine 1.021  1.005 - 1.030   pH 5.5  5.0 - 8.0   Glucose, UA NEGATIVE  NEGATIVE mg/dL   Hgb urine dipstick TRACE (*) NEGATIVE   Bilirubin Urine NEGATIVE  NEGATIVE   Ketones, ur NEGATIVE  NEGATIVE mg/dL   Protein, ur NEGATIVE  NEGATIVE mg/dL   Urobilinogen, UA 0.2  0.0 - 1.0 mg/dL   Nitrite NEGATIVE  NEGATIVE   Leukocytes, UA SMALL (*) NEGATIVE  URINE MICROSCOPIC-ADD ON     Status: Normal   Collection Time   01/21/12  7:10 PM      Component Value Range   Squamous Epithelial / LPF RARE  RARE   WBC, UA 3-6  <3 WBC/hpf   RBC / HPF 0-2  <3 RBC/hpf  COMPREHENSIVE METABOLIC PANEL     Status: Abnormal   Collection Time   01/21/12  8:10 PM      Component Value Range   Sodium 131 (*) 135 - 145 mEq/L   Potassium 3.5  3.5 - 5.1 mEq/L   Chloride 89 (*) 96 - 112 mEq/L   CO2 34 (*) 19 - 32 mEq/L   Glucose, Bld 136 (*) 70 - 99 mg/dL   BUN 31 (*) 6 - 23 mg/dL   Creatinine, Ser 1.61 (*) 0.50 - 1.10 mg/dL   Calcium 9.2  8.4 - 09.6 mg/dL   Total Protein 6.6  6.0 - 8.3 g/dL   Albumin 3.2 (*) 3.5 - 5.2 g/dL   AST 23  0 - 37 U/L   ALT 22  0 - 35 U/L   Alkaline Phosphatase 79  39 - 117 U/L   Total Bilirubin 0.8  0.3 - 1.2 mg/dL   GFR calc non Af Amer 40 (*) >90 mL/min   GFR  calc Af Amer 47 (*) >90 mL/min  APTT     Status: Normal    Collection Time   01/21/12  8:10 PM      Component Value Range   aPTT 30  24 - 37 seconds  PROTIME-INR     Status: Normal   Collection Time   01/21/12  8:10 PM      Component Value Range   Prothrombin Time 12.9  11.6 - 15.2 seconds   INR 0.95  0.00 - 1.49  CBC     Status: Abnormal   Collection Time   01/21/12  8:10 PM      Component Value Range   WBC 7.9  4.0 - 10.5 K/uL   RBC 3.33 (*) 3.87 - 5.11 MIL/uL   Hemoglobin 10.8 (*) 12.0 - 15.0 g/dL   HCT 16.1 (*) 09.6 - 04.5 %   MCV 91.9  78.0 - 100.0 fL   MCH 32.4  26.0 - 34.0 pg   MCHC 35.3  30.0 - 36.0 g/dL   RDW 40.9  81.1 - 91.4 %   Platelets 215  150 - 400 K/uL  DIFFERENTIAL     Status: Abnormal   Collection Time   01/21/12  8:10 PM      Component Value Range   Neutrophils Relative 77  43 - 77 %   Neutro Abs 6.1  1.7 - 7.7 K/uL   Lymphocytes Relative 10 (*) 12 - 46 %   Lymphs Abs 0.8  0.7 - 4.0 K/uL   Monocytes Relative 10  3 - 12 %   Monocytes Absolute 0.8  0.1 - 1.0 K/uL   Eosinophils Relative 2  0 - 5 %   Eosinophils Absolute 0.2  0.0 - 0.7 K/uL   Basophils Relative 1  0 - 1 %   Basophils Absolute 0.0  0.0 - 0.1 K/uL  SODIUM, URINE, RANDOM     Status: Normal   Collection Time   01/22/12  8:22 AM      Component Value Range   Sodium, Ur 24    OSMOLALITY, URINE     Status: Abnormal   Collection Time   01/22/12  8:23 AM      Component Value Range   Osmolality, Ur 350 (*) 390 - 1090 mOsm/kg  OSMOLALITY     Status: Normal   Collection Time   01/22/12  8:45 AM      Component Value Range   Osmolality 288  275 - 300 mOsm/kg   No results found for this or any previous visit (from the past 240 hour(s)). Creatinine:  Basename 01/21/12 2010 01/19/12 0430 01/18/12 0430 01/17/12 0620  CREATININE 1.11* 0.65 0.74 0.67    Impression/Assessment:  Bladder neoplasm  Plan:  I discussed the CT findings with the patient and her daughter. I reviewed the images with her daughter at the computer in the hallway. We discussed the mass  is still small, but it has progressed/grown from last year. We discussed this may be a bladder cancer and would likely need staging and grading in the OR with TURBT. They are not sure if they want to pursue surgical therapy. We discussed bladder tumors can progress, spread and become life threatening. They expressed understanding. The patient's son-in-law is a patient of ours and he had a radical cystectomy for muscle invasive bladder cancer, therefore the patient's daughter seems to have a good understanding of the situation. We discussed the first step would be for me to perform cystoscopy in the  office when the patient is feeling up to it. Her daughter said she wasn't sure if her mom would even tolerate a trip to the office for at least "6 weeks". I gave the patient's daughter my card and instructions to follow-up for cystoscopy in the office.   Antony Haste 01/22/2012, 3:31 PM

## 2012-01-23 LAB — BASIC METABOLIC PANEL
CO2: 30 mEq/L (ref 19–32)
Calcium: 9.2 mg/dL (ref 8.4–10.5)
Chloride: 95 mEq/L — ABNORMAL LOW (ref 96–112)
Creatinine, Ser: 0.55 mg/dL (ref 0.50–1.10)
Glucose, Bld: 119 mg/dL — ABNORMAL HIGH (ref 70–99)
Sodium: 136 mEq/L (ref 135–145)

## 2012-01-23 MED ORDER — FUROSEMIDE 10 MG/ML IJ SOLN
40.0000 mg | Freq: Once | INTRAMUSCULAR | Status: AC
  Administered 2012-01-23: 40 mg via INTRAVENOUS
  Filled 2012-01-23: qty 4

## 2012-01-23 MED ORDER — FLEET ENEMA 7-19 GM/118ML RE ENEM
1.0000 | ENEMA | Freq: Every day | RECTAL | Status: DC | PRN
  Administered 2012-01-23: 1 via RECTAL
  Filled 2012-01-23: qty 1

## 2012-01-23 MED ORDER — BETHANECHOL CHLORIDE 10 MG PO TABS
10.0000 mg | ORAL_TABLET | Freq: Three times a day (TID) | ORAL | Status: DC
Start: 2012-01-23 — End: 2012-01-24
  Administered 2012-01-23 – 2012-01-24 (×3): 10 mg via ORAL
  Filled 2012-01-23 (×5): qty 1

## 2012-01-23 NOTE — Progress Notes (Addendum)
Called to room. Pt feels SOB after getting up to bedside commode.Assisted back to be Lung fields decreased, essentially clear however difficult to assess because pt experiences pain with deep breathing.   Pulse ox 95% on RA. Color good. Respirations slightly labored at rest. BP 154/67, P75, R24, Temp 97.5. Pt more calm after resting in bed. However still complains of SOB.  Pulse with occasional irregular beat when noted over one minute. Page to Dr. Conley Rolls.

## 2012-01-23 NOTE — Progress Notes (Signed)
Pt resting comfortably

## 2012-01-23 NOTE — Evaluation (Signed)
Physical Therapy Evaluation Patient Details Name: Briana Schultz MRN: [id number] DOB: Sep 05, 1912 Today's Date: 01/23/2012 Time: 2440-1027 PT Time Calculation (min): 18 min  PT Assessment / Plan / Recommendation Clinical Impression  78 yo female admitted with weakness, urinary retention. Pt brought to [facility] from SNF. Demonstrates weakness, decreased activity tolerance, pain with mobility. Recommend SNF for rehab to improve strength, activity tolerance, and overall safety with functional mobility.     PT Assessment  Patient needs continued PT services    Follow Up Recommendations  Skilled nursing facility    Barriers to Discharge        Equipment Recommendations  Defer to next venue    Recommendations for Other Services OT consult   Frequency Min 3X/week    Precautions / Restrictions Precautions Precautions: Fall Restrictions Weight Bearing Restrictions: No   Pertinent Vitals/Pain       Mobility  Bed Mobility Bed Mobility: Supine to Sit;Sit to Supine Supine to Sit: 4: Min assist;HOB elevated;With rails Sit to Supine: 4: Min assist;HOB flat;With rail Details for Bed Mobility Assistance: Assist for LEs off/onto bed and trunk to supine/upright.  Transfers Transfers: Sit to Stand;Stand to Sit Sit to Stand: 4: Min assist;From elevated surface;With upper extremity assist;From bed Stand to Sit: To bed;With upper extremity assist Details for Transfer Assistance: VCs safety, technique, hand placement. Assist to rise, stabilize, control descent.  Ambulation/Gait Ambulation/Gait Assistance: 4: Min assist Ambulation Distance (Feet): 60 Feet Assistive device: Rolling walker Ambulation/Gait Assistance Details: Unsteady. VCs safety, pacing. Assist to stabilize throughout ambulation and to navigate with RW.  Gait Pattern: Step-through pattern;Trunk flexed;Decreased stride length;Decreased step length - right;Decreased step length - left    Exercises     PT Diagnosis: Difficulty  walking;Acute pain  PT Problem List: Decreased strength;Decreased activity tolerance;Decreased mobility;Pain;Decreased knowledge of use of DME;Decreased safety awareness;Decreased balance PT Treatment Interventions: DME instruction;Gait training;Functional mobility training;Therapeutic activities;Therapeutic exercise;Patient/family education;Balance training   PT Goals Acute Rehab PT Goals PT Goal Formulation: With patient/family Time For Goal Achievement: 02/06/12 Potential to Achieve Goals: Good Pt will go Supine/Side to Sit: with supervision PT Goal: Supine/Side to Sit - Progress: Goal set today Pt will go Sit to Supine/Side: with supervision PT Goal: Sit to Supine/Side - Progress: Goal set today Pt will go Sit to Stand: with supervision PT Goal: Sit to Stand - Progress: Goal set today Pt will Ambulate: >150 feet;with supervision;with least restrictive assistive device PT Goal: Ambulate - Progress: Goal set today  Visit Information  Last PT Received On: 01/23/12 Assistance Needed: +1    Subjective Data  Subjective: "I feel sick now" (after walking) Patient Stated Goal: More comfortable.   Prior Functioning  Home Living Available Help at Discharge: Skilled Nursing Facility Additional Comments: Pt only at SNF for 1 day per family. Plan is for pt to d/c to another SNF for rehab    Cognition  Overall Cognitive Status: Appears within functional limits for tasks assessed/performed Arousal/Alertness: Awake/alert Orientation Level: Appears intact for tasks assessed Behavior During Session: [facility] for tasks performed    Extremity/Trunk Assessment Right Lower Extremity Assessment RLE ROM/Strength/Tone: [facility] for tasks assessed Left Lower Extremity Assessment LLE ROM/Strength/Tone: [facility] for tasks assessed Trunk Assessment Trunk Assessment: Kyphotic   Balance    End of Session PT - End of Session Activity Tolerance: Patient limited by pain;Patient limited by fatigue Patient left: in  bed;with call bell/phone within reach;with family/visitor present  GP     Rebeca Alert [facility] 01/23/2012, 11:46 AM [phone]

## 2012-01-23 NOTE — Progress Notes (Signed)
Clinical Social Work Department BRIEF PSYCHOSOCIAL ASSESSMENT 01/23/2012  Patient:  ORLA, ESTRIN     Account Number:  [id number]     Admit date:  01/21/2012  Clinical Social Worker:  Leron Croak, CLINICAL SOCIAL WORKER  Date/Time:  01/23/2012 02:16 PM  Referred by:  Physician  Date Referred:  01/22/2012 Referred for  SNF Placement   Other Referral:   Interview type:  Patient Other interview type:    PSYCHOSOCIAL DATA Living Status:  FACILITY Admitted from facility:  [facility] Level of care:  Skilled Nursing Facility Primary support name:  Alejandro Mulling Primary support relationship to patient:  CHILD, ADULT Degree of support available:   good    CURRENT CONCERNS Current Concerns  Post-Acute Placement   Other Concerns:   Pt/family does not like [facility] and would like to swiitch to a different facility.    SOCIAL WORK ASSESSMENT / PLAN CSW met with Pt for d/c planning. Pt would like to be considered for palcement at [facility] or [place].   Assessment/plan status:  Information/Referral to [facility] Other assessment/ plan:   Information/referral to community resources:   CSW provided a list of SNF facilities.    PATIENT'S/FAMILY'S RESPONSE TO PLAN OF CARE: Pt was appreciative for assistance switiching facilities.      Leron Croak, LCSWA [employer] [phone]

## 2012-01-23 NOTE — Progress Notes (Signed)
IV infilitrated during administration of IV Lasix. Small swell noted at IV will Lasix being pushed over 2 minutes. Unable to accurately state how much Lasix was given. IV removed and remainder of Lasix discarded. Pt is calm and breathing improved.

## 2012-01-23 NOTE — Progress Notes (Addendum)
[facility] PROGRESS NOTE  Briana Schultz WNU:[id number] DOB: [birth year] DOA: 01/21/2012 PCP: Pamelia Hoit, MD  Assessment/Plan: Principal Problem:  *Urinary retention  Seems to be improved.  Likely triggered by constipation.  Urinating well.  Check post void residuals. Active Problems: Constipation  Continue aggressive bowel regimen.  Disimpacted by RN 01/22/12.  Dyspnea  Developed hypoxia 7/41/13 after getting up to bedside commode.  O2 saturation was 95%  No calf tenderness, chest pain or tachycardia to suggest PE.  Cross covering MD gave a dose of lasix.  Acute renal failure  Hold Mobic and Avapro.  Continue to hydrate.  Hyponatremia  Urine osmolality 350, serum osmolality 288.  Urine sodium 24.  TSH, cortisol WNL.  May be lab error (given normal serum osmolality) or mild SIADH.  UTI (lower urinary tract infection)  U/A shows no nitrates.  GERD (gastroesophageal reflux disease)  Continue PPI.  Normocytic anemia  Likely anemia of chronic disease.  Bladder mass  Seen by Dr. Mena Goes 01/22/12.  F/U with him as outpatient for further evaluation with cystoscopy, if desired.  HTN (hypertension)  Avapro on hold secondary to ARF.  Protein calorie malnutrition  Glucerna supplements.  Code Status: Full. Family Communication: [name] (Daughter) updated by telephone. Disposition Plan: SNF when stable.   Brief narrative: Briana Schultz is a 78 year old woman who was originally hospitalized with a fall on 01/17/12.  She was discharged to a SNF on 01/20/12.  She was sent back to the hospital due to urinary retention.  Medical Consultants:  Dr. Mena Goes, Urology  Other Consultants:  Physical therapy  Occupational therapy  Procedures:  None.  Antibiotics:  None.  HPI/Subjective: Briana Schultz is tired this morning.  She could not tell me if her bowels moved yesterday, but her daughter told me that the nurses disimpacted her.  She is still  intermittently short of breath.  No chest pain or cough.  No fever.  Appetite diminished.  Objective: Filed Vitals:   01/22/12 2055 01/23/12 0334 01/23/12 0440 01/23/12 0536  BP: 169/56 168/64 154/67 130/55  Pulse: 70   67  Temp: 97.4 F (36.3 C)   97.4 F (36.3 C)  TempSrc: Oral   Oral  Resp: 18   19  Height:      Weight:      SpO2: 95%   95%    Intake/Output Summary (Last 24 hours) at 01/23/12 0753 Last data filed at 01/23/12 0650  Gross per 24 hour  Intake   1715 ml  Output   2450 ml  Net   -735 ml    Exam: Gen:  NAD Cardiovascular:  RRR, No M/R/G Respiratory: Lungs CTAB Gastrointestinal: Abdomen soft, NT/ND with normal active bowel sounds. Extremities: No C/E/C.  No calf tenderness.  Data Reviewed: Basic Metabolic Panel:  Lab 01/21/12 0347 01/19/12 0430 01/18/12 0430 01/17/12 0856 01/17/12 0620  NA 131* 135 135 -- 128*  K 3.5 3.6 -- -- --  CL 89* 96 95* -- 88*  CO2 34* 34* 33* -- 30  GLUCOSE 136* 113* 97 -- 109*  BUN 31* 15 13 -- 19  CREATININE 1.11* 0.65 0.74 -- 0.67  CALCIUM 9.2 8.8 9.1 -- 9.3  MG -- -- -- 1.9 --  PHOS -- -- -- 3.0 --   GFR Estimated Creatinine Clearance: 21.4 ml/min (by C-G formula based on Cr of 1.11). Liver Function Tests:  Lab 01/21/12 2010  AST 23  ALT 22  ALKPHOS 79  BILITOT 0.8  PROT 6.6  ALBUMIN  3.2*   Coagulation profile  Lab 01/21/12 2010  INR 0.95  PROTIME --    CBC:  Lab 01/21/12 2010 01/19/12 0430 01/18/12 0430 01/17/12 0620  WBC 7.9 7.6 6.4 12.2*  NEUTROABS 6.1 -- -- 10.4*  HGB 10.8* 10.0* 10.1* 11.6*  HCT 30.6* 29.6* 29.4* 32.7*  MCV 91.9 91.1 89.6 87.9  PLT 215 159 152 167   CBG:  Lab 01/20/12 0740 01/19/12 0745 01/18/12 0651  GLUCAP 99 112* 80   Urinalysis    Component Value Date/Time   COLORURINE YELLOW 01/21/2012 1910   APPEARANCEUR CLOUDY* 01/21/2012 1910   LABSPEC 1.021 01/21/2012 1910   PHURINE 5.5 01/21/2012 1910   GLUCOSEU NEGATIVE 01/21/2012 1910   HGBUR TRACE* 01/21/2012 1910    BILIRUBINUR NEGATIVE 01/21/2012 1910   KETONESUR NEGATIVE 01/21/2012 1910   PROTEINUR NEGATIVE 01/21/2012 1910   UROBILINOGEN 0.2 01/21/2012 1910   NITRITE NEGATIVE 01/21/2012 1910   LEUKOCYTESUR SMALL* 01/21/2012 1910     Studies: None.  Scheduled Meds:    . calcium carbonate  1 tablet Oral BID WC  . cholecalciferol  5,000 Units Oral Weekly  . docusate sodium  100 mg Oral BID  . erythromycin   Both Eyes Q8H  . feeding supplement  237 mL Oral BID BM  . furosemide  40 mg Intravenous Once  . halobetasol   Topical BID  . multivitamin with minerals  1 tablet Oral Daily  . pantoprazole  40 mg Oral Daily  . sodium chloride  3 mL Intravenous Q12H  . DISCONTD: irbesartan  150 mg Oral Daily  . DISCONTD: meloxicam  7.5 mg Oral BID  . DISCONTD: pantoprazole  40 mg Oral Q1200   Continuous Infusions:    . sodium chloride 10 mL/hr at 01/23/12 0607    Time spent: 25 minutes.   LOS: 2 days   RAMA,CHRISTINA  [facility] Pager [phone].  If 8PM-8AM, please contact night-coverage at [email or web address], password [facility] 01/23/2012, 7:53 AM

## 2012-01-24 ENCOUNTER — Inpatient Hospital Stay
Admission: RE | Admit: 2012-01-24 | Discharge: 2012-02-17 | Disposition: A | Discharge status: Home / Self Care | Payer: Medicare Other | Source: Ambulatory Visit | Attending: Internal Medicine | Admitting: Internal Medicine

## 2012-01-24 ENCOUNTER — Inpatient Hospital Stay (HOSPITAL_COMMUNITY): Payer: Medicare Other

## 2012-01-24 DIAGNOSIS — J189 Pneumonia, unspecified organism: Principal | ICD-10-CM

## 2012-01-24 DIAGNOSIS — R52 Pain, unspecified: Secondary | ICD-10-CM

## 2012-01-24 DIAGNOSIS — K5900 Constipation, unspecified: Secondary | ICD-10-CM

## 2012-01-24 LAB — COMPREHENSIVE METABOLIC PANEL
Albumin: 3.1 g/dL — ABNORMAL LOW (ref 3.5–5.2)
Alkaline Phosphatase: 108 U/L (ref 39–117)
BUN: 18 mg/dL (ref 6–23)
CO2: 27 mEq/L (ref 19–32)
Chloride: 96 mEq/L (ref 96–112)
Creatinine, Ser: 0.61 mg/dL (ref 0.50–1.10)
GFR calc Af Amer: 85 mL/min — ABNORMAL LOW (ref >90)
GFR calc non Af Amer: 74 mL/min — ABNORMAL LOW (ref >90)
Glucose, Bld: 179 mg/dL — ABNORMAL HIGH (ref 70–99)
Potassium: 3.9 mEq/L (ref 3.5–5.1)
Total Bilirubin: 0.8 mg/dL (ref 0.3–1.2)

## 2012-01-24 LAB — CARDIAC PANEL(CRET KIN+CKTOT+MB+TROPI)
Relative Index: INVALID (ref 0.0–2.5)
Total CK: 23 U/L (ref 7–177)

## 2012-01-24 LAB — CBC
Platelets: 257 10*3/uL (ref 150–400)
RBC: 3.54 MIL/uL — ABNORMAL LOW (ref 3.87–5.11)
RDW: 13.1 % (ref 11.5–15.5)
WBC: 8.4 10*3/uL (ref 4.0–10.5)

## 2012-01-24 LAB — D-DIMER, QUANTITATIVE: D-Dimer, Quant: 2.25 ug/mL-FEU — ABNORMAL HIGH (ref 0.00–0.48)

## 2012-01-24 LAB — GLUCOSE, CAPILLARY: Glucose-Capillary: 156 mg/dL — ABNORMAL HIGH (ref 70–99)

## 2012-01-24 MED ORDER — ERYTHROMYCIN 5 MG/GM OP OINT: TOPICAL_OINTMENT | Freq: Three times a day (TID) | OPHTHALMIC | Status: AC

## 2012-01-24 MED ORDER — FUROSEMIDE 40 MG PO TABS
40.0000 mg | ORAL_TABLET | Freq: Once | ORAL | Status: AC
  Administered 2012-01-24: 40 mg via ORAL
  Filled 2012-01-24: qty 1

## 2012-01-24 MED ORDER — ALPRAZOLAM 0.5 MG PO TABS: 0.5000 mg | ORAL_TABLET | Freq: Every evening | ORAL | Status: DC | PRN

## 2012-01-24 MED ORDER — HYDROCODONE-ACETAMINOPHEN 5-325 MG PO TABS: 1.0000 | ORAL_TABLET | Freq: Four times a day (QID) | ORAL | Status: AC | PRN

## 2012-01-24 MED ORDER — AMLODIPINE BESYLATE 10 MG PO TABS
10.0000 mg | ORAL_TABLET | Freq: Every day | ORAL | Status: DC
  Administered 2012-01-24: 10 mg via ORAL
  Filled 2012-01-24: qty 1

## 2012-01-24 MED ORDER — ASPIRIN 325 MG PO TABS
325.0000 mg | ORAL_TABLET | Freq: Every day | ORAL | Status: DC
  Administered 2012-01-24: 325 mg via ORAL
  Filled 2012-01-24: qty 1

## 2012-01-24 MED ORDER — BETHANECHOL CHLORIDE 10 MG PO TABS: 10.0000 mg | ORAL_TABLET | Freq: Three times a day (TID) | ORAL | Status: DC | PRN

## 2012-01-24 MED ORDER — BISACODYL 10 MG RE SUPP: 10.0000 mg | Freq: Every day | RECTAL | Status: AC | PRN

## 2012-01-24 MED ORDER — FUROSEMIDE 20 MG PO TABS: 20.0000 mg | ORAL_TABLET | Freq: Every day | ORAL | Status: DC

## 2012-01-24 MED ORDER — CARVEDILOL 3.125 MG PO TABS: 3.2500 mg | ORAL_TABLET | Freq: Two times a day (BID) | ORAL | Status: DC

## 2012-01-24 MED FILL — Docusate Sodium Cap 100 MG: ORAL | Qty: 1 | Status: AC

## 2012-01-24 MED FILL — Bethanechol Chloride Tab 10 MG: ORAL | Qty: 1 | Status: AC

## 2012-01-24 MED FILL — Hydralazine HCl Inj 20 MG/ML: INTRAMUSCULAR | Qty: 1 | Status: AC

## 2012-01-24 MED FILL — Alprazolam Tab 0.5 MG: ORAL | Qty: 1 | Status: AC

## 2012-01-24 NOTE — Progress Notes (Signed)
Pt to transfer to [facility] today via family transport. Pt's dtr to complete ppw at [facility] and will return to [facility] to p/u pt and transport to [facility]. D/C packet complete with signed FL2 and chart copy. CSW signing off at d/c as no other CSW needs identified. Dellie Burns, MSW, [place] [phone] (coverage)

## 2012-01-24 NOTE — Progress Notes (Signed)
Pt slightly more confused this AM. Redirected easily, but initially awoke thinking she was at home with a "new bed and a new beautiful room". Still oriented to self, and DOB, unaware of year. Also made two attempts to get up without assistance during the night. Bed alarm utilized through out the shift.

## 2012-01-24 NOTE — Discharge Summary (Addendum)
Physician Discharge Summary  AIRA SALLADE ZOX:[id number] DOB: [birth year] DOA: 01/21/2012  PCP: Pamelia Hoit, MD  Admit date: 01/21/2012 Discharge date: 01/24/2012  Recommendations for Outpatient Follow-up:  1. F/U with Dr. Mena Goes of urology if further evaluation of bladder mass desired.  Patient declined inpatient evaluation. 2. Check BMET in 2 days to ensure renal function and electrolytes stable.  Discharge Diagnoses:   Principal Problem:  *Urinary retention  Active Problems:   Hyponatremia   UTI (lower urinary tract infection)   GERD (gastroesophageal reflux disease)   Normocytic anemia   Bladder mass   HTN (hypertension)   Protein calorie malnutrition   ARF (acute renal failure)   Dyspnea   Discharge Condition: Improved  Diet recommendation: Low sodium, heart healthy  History of present illness:  Briana Schultz is a 78 year old woman who was originally hospitalized with a fall on 01/17/12. She was discharged to a SNF on 01/20/12. She was sent back to the hospital due to urinary retention.  Hospital Course by problem:  Principal Problem:  *Urinary retention  Seems to be improved. Likely triggered by constipation. Urinating well.  Responded to bethanicol. Active Problems:  Constipation  Continue aggressive bowel regimen.  Disimpacted by RN 01/22/12. Dyspnea with elevated pro BNP Developed hypoxia 7/41/13 after getting up to bedside commode.  O2 saturation was 95%  No calf tenderness, chest pain or tachycardia to suggest PE.  Cross covering MD gave a dose of lasix. Prior to d/c, the patient again developed acute dyspnea and a sensation of being "hot".  A diagnostic evaluation revealed normal cardiac markers but an elevated pro-BNP.  Symptoms resolved rapidly. Given a dose of lasix.  Will d/c home on daily low dose lasix. Would defer further inpatient cardiac work up of her suspected diastolic CHF given her age, rapid resolution of symptoms. Acute renal  failure  Mobic and Avapro discontinued.  Renal function normalized with IVF. OK to resume Avapro at d/c. Hyponatremia  Urine osmolality 350, serum osmolality 288. Urine sodium 24.  TSH, cortisol WNL.  May be lab error (given normal serum osmolality) or mild SIADH.  Sodium normal at d/c. UTI (lower urinary tract infection)  U/A shows no nitrates. GERD (gastroesophageal reflux disease)  Continue PPI. Normocytic anemia  Likely anemia of chronic disease. Bladder mass  Seen by Dr. Mena Goes 01/22/12. F/U with him as outpatient for further evaluation with cystoscopy, if desired. HTN (hypertension)  Avapro on hold secondary to ARF. Protein calorie malnutrition  Glucerna supplements.  Procedures:  None.  Consultations:  Dr. Jerilee Field, Urology  Discharge Exam: Filed Vitals:   01/24/12 1405  BP: 137/53  Pulse: 94  Temp: 97.3 F (36.3 C)  Resp: 22   Filed Vitals:   01/23/12 1355 01/24/12 0617 01/24/12 0945 01/24/12 1405  BP: 138/42 154/62 148/62 137/53  Pulse: 87 74  94  Temp: 97.8 F (36.6 C) 97.7 F (36.5 C)  97.3 F (36.3 C)  TempSrc: Oral Oral  Oral  Resp: 16 18  22   Height:      Weight:      SpO2: 98% 95%  98%    Gen:  NAD Cardiovascular:  RRR, No M/R/G Respiratory: Lungs CTAB Gastrointestinal: Abdomen soft, NT/ND with normal active bowel sounds. Extremities: No C/E/C Skin: Multiple ecchymosis   Discharge Instructions  Discharge Orders    Future Orders Please Complete By Expires   Diet - low sodium heart healthy      Increase activity slowly      Walk  with assistance      Walker       Call MD for:  severe uncontrolled pain      Call MD for:      Scheduling Instructions:   Recurrent urinary retention.     Medication List  As of 01/24/2012  3:35 PM   STOP taking these medications         meloxicam 7.5 MG tablet         TAKE these medications         ALPRAZolam 0.5 MG tablet   Commonly known as: XANAX   Take 1 tablet (0.5 mg total) by  mouth at bedtime as needed. insomnia      amLODipine 10 MG tablet   Commonly known as: NORVASC   Take 10 mg by mouth daily.      bethanechol 10 MG tablet   Commonly known as: URECHOLINE   Take 1 tablet (10 mg total) by mouth 3 (three) times daily as needed (Urinary retention).      bisacodyl 10 MG suppository   Commonly known as: DULCOLAX   Place 1 suppository (10 mg total) rectally daily as needed.      calcium carbonate 600 MG Tabs   Commonly known as: OS-CAL   Take 600 mg by mouth 2 (two) times daily with a meal.      carvedilol 3.125 MG tablet   Commonly known as: COREG   Take 1 tablet (3.125 mg total) by mouth 2 (two) times daily with a meal.      cholecalciferol 1000 UNITS tablet   Commonly known as: VITAMIN D   Take 5,000 Units by mouth once a week. On Fridays. Confirmed by family member      docusate sodium 100 MG capsule   Commonly known as: COLACE   Take 100 mg by mouth 2 (two) times daily as needed.      erythromycin ophthalmic ointment   Place into both eyes every 8 (eight) hours.      feeding supplement Liqd   Take 237 mLs by mouth 2 (two) times daily between meals.      furosemide 20 MG tablet   Commonly known as: LASIX   Take 1 tablet (20 mg total) by mouth daily.      halobetasol 0.05 % cream   Commonly known as: ULTRAVATE   Apply topically 2 (two) times daily.      HYDROcodone-acetaminophen 5-325 MG per tablet   Commonly known as: NORCO/VICODIN   Take 1-2 tablets by mouth every 6 (six) hours as needed.      irbesartan 150 MG tablet   Commonly known as: AVAPRO   Take 150 mg by mouth daily.      multivitamin with minerals Tabs   Take 1 tablet by mouth daily.      omeprazole 20 MG capsule   Commonly known as: PRILOSEC   Take 20 mg by mouth daily.      sodium phosphate Pediatric 3.5-9.5 GM/59ML enema   Place 1 enema rectally once.           Follow-up Information    Follow up with Pamelia Hoit, MD. Schedule an appointment as soon as  possible for a visit in 1 week.   Contact information:   [address] [place] [address] [phone]       Follow up with Antony Haste, MD in 1 month. (For evaluation of bladder mass)    Contact information:   [address] [facility] [place] [address] [phone]           The results of significant diagnostics from this hospitalization (including imaging, microbiology, ancillary and laboratory) are listed below for reference.    Significant Diagnostic Studies: None  Microbiology: No results found for this or any previous visit (from the past 240 hour(s)).   Labs: Basic Metabolic Panel:  Lab 01/24/12 5621 01/23/12 1105 01/21/12 2010 01/19/12 0430 01/18/12 0430  NA PENDING 136 131* 135 135  K PENDING 3.5 -- -- --  CL PENDING 95* 89* 96 95*  CO2 27 30 34* 34* 33*  GLUCOSE 179* 119* 136* 113* 97  BUN 18 16 31* 15 13  CREATININE 0.61 0.55 1.11* 0.65 0.74  CALCIUM 8.9 9.2 9.2 8.8 9.1  MG -- -- -- -- --  PHOS -- -- -- -- --   GFR Estimated Creatinine Clearance: 29.6 ml/min (by C-G formula based on Cr of 0.61). Liver Function Tests:  Lab 01/24/12 1304 01/21/12 2010  AST 17 23  ALT 19 22  ALKPHOS 108 79  BILITOT 0.8 0.8  PROT 6.4 6.6  ALBUMIN 3.1* 3.2*   Coagulation profile  Lab 01/21/12 2010  INR 0.95  PROTIME --    CBC:  Lab 01/24/12 1304 01/21/12 2010 01/19/12 0430 01/18/12 0430  WBC 8.4 7.9 7.6 6.4  NEUTROABS -- 6.1 -- --  HGB 11.2* 10.8* 10.0* 10.1*  HCT 32.6* 30.6* 29.6* 29.4*  MCV 92.1 91.9 91.1 89.6  PLT 257 215 159 152   Cardiac Enzymes:  Lab 01/24/12 1305  CKTOTAL 23  CKMB 2.2  CKMBINDEX --  TROPONINI <0.30   BNP:  Ref. Range 01/24/2012 13:05  Pro B Natriuretic peptide (BNP) Latest Range: 0-450 pg/mL 871.3 (H)  CBG:  Lab 01/24/12 1311 01/20/12 0740 01/19/12 0745 01/18/12 0651  GLUCAP 156* 99 112* 80   D-Dimer  Basename  01/24/12 1304  DDIMER 2.25*   Thyroid function studies  Basename 01/22/12 0845  TSH 1.335  T4TOTAL --  T3FREE --  THYROIDAB --     Time coordinating discharge: 35 minutes.  Signed:  Elgar Scoggins  Pager [phone] [facility] 01/24/2012, 3:35 PM

## 2012-01-24 NOTE — Progress Notes (Signed)
Clinical Social Work Department CLINICAL SOCIAL WORK PLACEMENT NOTE 01/24/2012  Patient:  MYANA, SCHLUP  Account Number:  [id number] Admit date:  01/21/2012  Clinical Social Worker:  Skip Mayer  Date/time:  01/24/2012 11:19 AM  Clinical Social Work is seeking post-discharge placement for this patient at the following level of care:   SKILLED NURSING   (*CSW will update this form in Epic as items are completed)   01/23/2012  Patient/family provided with [facility] Department of Clinical Social Work's list of facilities offering this level of care within the geographic area requested by the patient (or if unable, by the patient's family).  01/23/2012  Patient/family informed of their freedom to choose among providers that offer the needed level of care, that participate in Medicare, Medicaid or managed care program needed by the patient, have an available bed and are willing to accept the patient.  01/23/2012  Patient/family informed of [facility]' ownership interest in [facility], as well as of the fact that they are under no obligation to receive care at this facility.  PASARR submitted to EDS on 01/23/2012 PASARR number received from [employer]DS on 01/23/2012  FL2 transmitted to all facilities in geographic area requested by pt/family on  01/23/2012 FL2 transmitted to all facilities within larger geographic area on   Patient informed that his/her managed care company has contracts with or will negotiate with  certain facilities, including the following:     Patient/family informed of bed offers received:  01/24/2012 Dorma Russell) Patient chooses bed at [facility]) Physician recommends and patient chooses bed at  [facility] ([facility])  Patient to be transferred to [facility] on  01/24/2012 Dorma Russell) Patient to be transferred to facility by Family Dorma Russell)  The following physician request were entered in Epic:   Additional Comments: Existing PASARR  #  Dellie Burns, MSW, [employer] [phone]

## 2012-01-26 ENCOUNTER — Ambulatory Visit (HOSPITAL_COMMUNITY)
Admit: 2012-01-26 | Discharge: 2012-01-26 | Disposition: A | Discharge status: Skilled Nursing Facility | Payer: Medicare Other | Source: Skilled Nursing Facility | Attending: Internal Medicine | Admitting: Internal Medicine

## 2012-01-26 DIAGNOSIS — J189 Pneumonia, unspecified organism: Secondary | ICD-10-CM | POA: Insufficient documentation

## 2012-02-08 ENCOUNTER — Ambulatory Visit (HOSPITAL_COMMUNITY)
Admit: 2012-02-08 | Discharge: 2012-02-08 | Disposition: A | Discharge status: Skilled Nursing Facility | Payer: Medicare Other | Source: Skilled Nursing Facility | Attending: Internal Medicine | Admitting: Internal Medicine

## 2012-02-08 DIAGNOSIS — R109 Unspecified abdominal pain: Secondary | ICD-10-CM | POA: Insufficient documentation

## 2012-02-08 DIAGNOSIS — M25559 Pain in unspecified hip: Secondary | ICD-10-CM | POA: Insufficient documentation

## 2012-02-08 DIAGNOSIS — M545 Low back pain, unspecified: Secondary | ICD-10-CM | POA: Insufficient documentation

## 2012-02-08 DIAGNOSIS — M546 Pain in thoracic spine: Secondary | ICD-10-CM | POA: Insufficient documentation

## 2012-02-08 DIAGNOSIS — K5909 Other constipation: Secondary | ICD-10-CM | POA: Insufficient documentation

## 2012-02-08 DIAGNOSIS — M8448XA Pathological fracture, other site, initial encounter for fracture: Secondary | ICD-10-CM | POA: Insufficient documentation

## 2012-02-09 ENCOUNTER — Telehealth: Payer: Self-pay | Admitting: Orthopedic Surgery

## 2012-02-09 NOTE — Telephone Encounter (Signed)
Send to neurosurgery.

## 2012-02-09 NOTE — Telephone Encounter (Signed)
Received call from [facility] at [facility], ph# [phone]; states that the facility physician, Dr. Baltazar Najjar, requests orthopaedic appointment for this patient, 78 years old, evaluation for back pain.  She has had Xrays at [facility].  Please review Xrays and advise regarding scheduling appointment.

## 2012-02-10 LAB — GLUCOSE, CAPILLARY: Glucose-Capillary: 74 mg/dL (ref 70–99)

## 2012-02-10 NOTE — Telephone Encounter (Signed)
Called back to [facility]; relayed to [facility]; faxed Dr. Mort Sawyers note to [phone].

## 2012-02-11 LAB — GLUCOSE, CAPILLARY
Glucose-Capillary: 148 mg/dL — ABNORMAL HIGH (ref 70–99)
Glucose-Capillary: 218 mg/dL — ABNORMAL HIGH (ref 70–99)

## 2012-02-12 LAB — GLUCOSE, CAPILLARY
Glucose-Capillary: 150 mg/dL — ABNORMAL HIGH (ref 70–99)
Glucose-Capillary: 149 mg/dL — ABNORMAL HIGH (ref 70–99)

## 2012-02-13 LAB — GLUCOSE, CAPILLARY: Glucose-Capillary: 86 mg/dL (ref 70–99)

## 2012-02-15 LAB — GLUCOSE, CAPILLARY: Glucose-Capillary: 92 mg/dL (ref 70–99)

## 2012-02-16 LAB — GLUCOSE, CAPILLARY: Glucose-Capillary: 198 mg/dL — ABNORMAL HIGH (ref 70–99)

## 2012-10-08 ENCOUNTER — Other Ambulatory Visit (HOSPITAL_BASED_OUTPATIENT_CLINIC_OR_DEPARTMENT_OTHER): Payer: Self-pay | Admitting: Internal Medicine

## 2013-10-14 IMAGING — CT CT ABD-PELV W/O CM
2 of 3 series · 16 of 46 positions shown, 18 images · non-contrast
Comparison: 12/07/2010

CLINICAL DATA: Right flank pain

CT ABDOMEN AND PELVIS WITHOUT CONTRAST
TECHNIQUE: Multidetector CT imaging of the abdomen and pelvis was
performed following the standard protocol without intravenous
contrast.

[Series 2: renal stone > 200 lbs 5.0 b31f · axial · 0.61mm/px · z∈[-228,+58]mm · 13 of 67 slices shown, 15 images]
[im 5/67  soft-tissue]
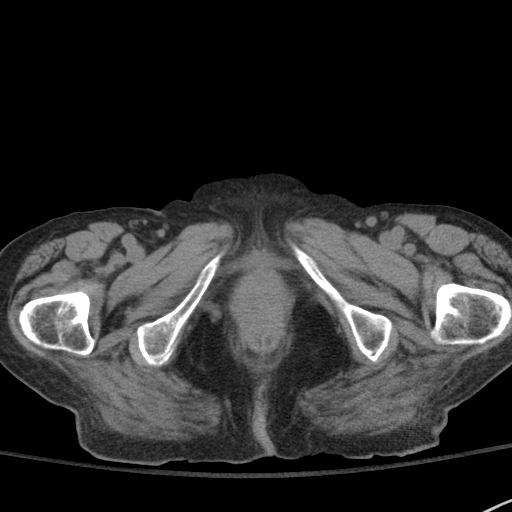
[im 5/67  bone]
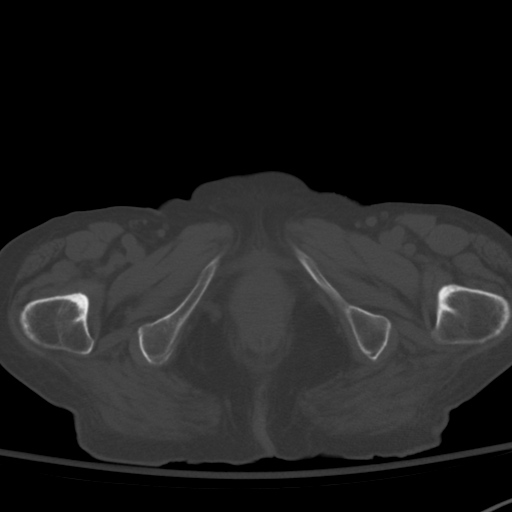
[im 9/67  soft-tissue]
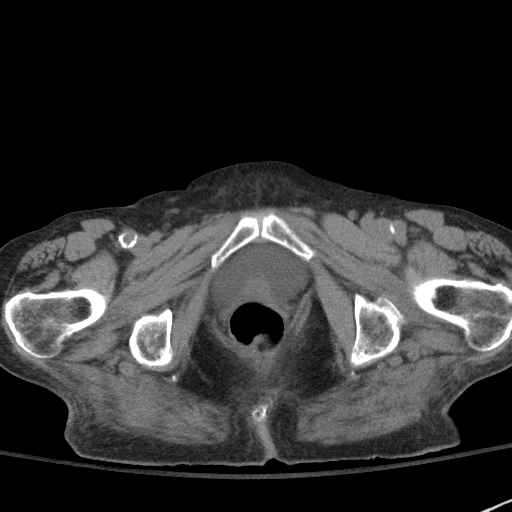
[im 13/67  soft-tissue]
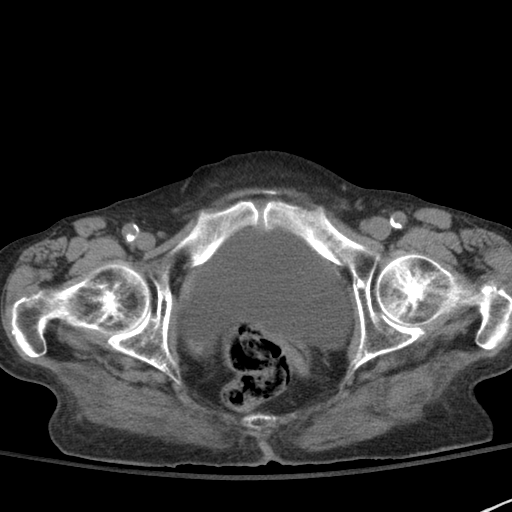
[im 20/67  soft-tissue]
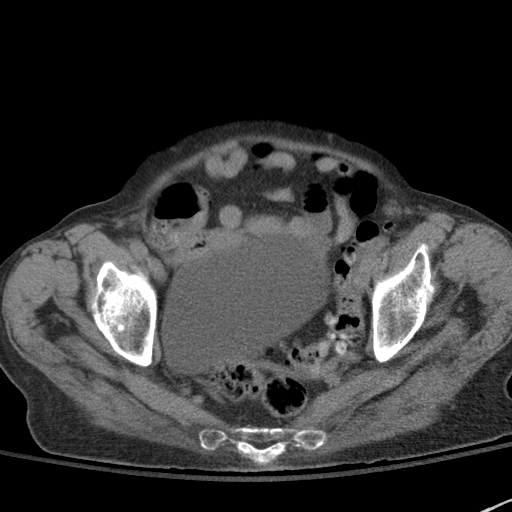
[im 24/67  soft-tissue]
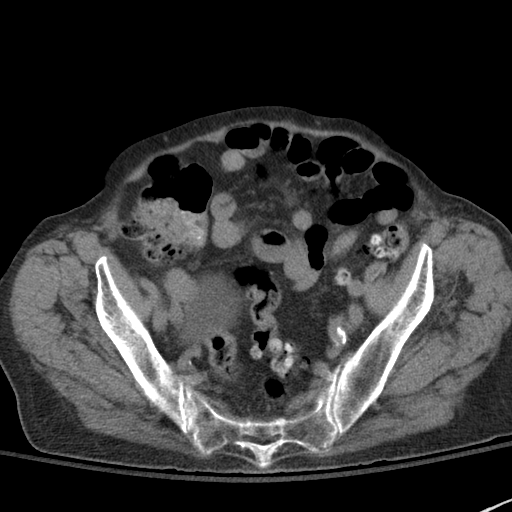
[im 28/67  soft-tissue]
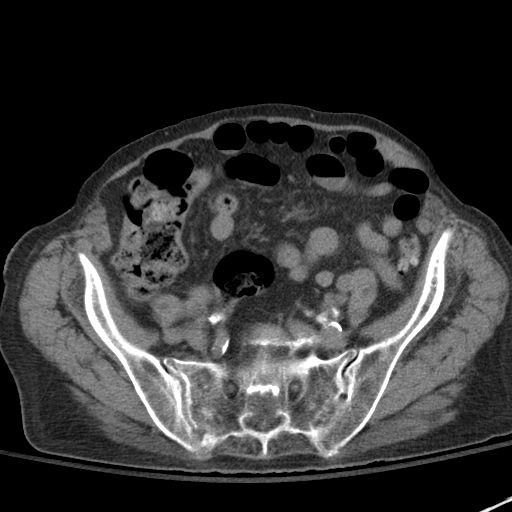
[im 35/67  soft-tissue]
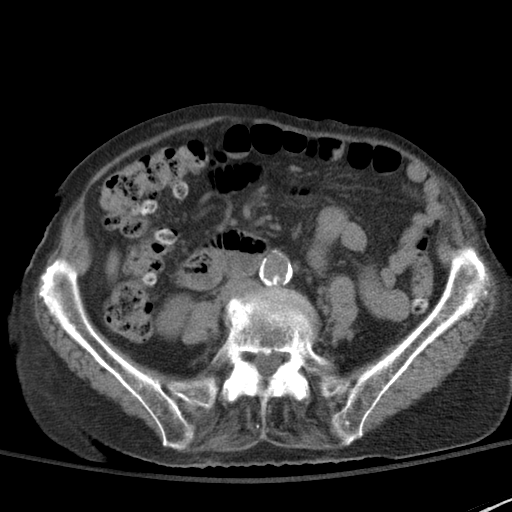
[im 39/67  soft-tissue]
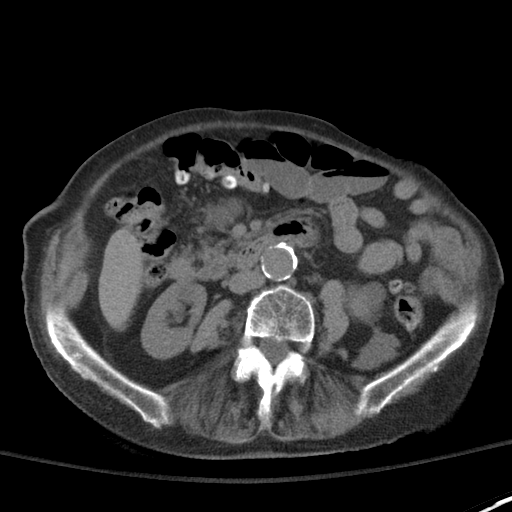
[im 43/67  soft-tissue]
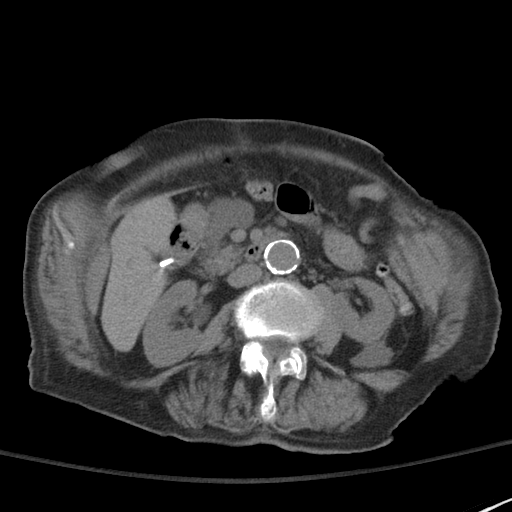
[im 43/67  bone]
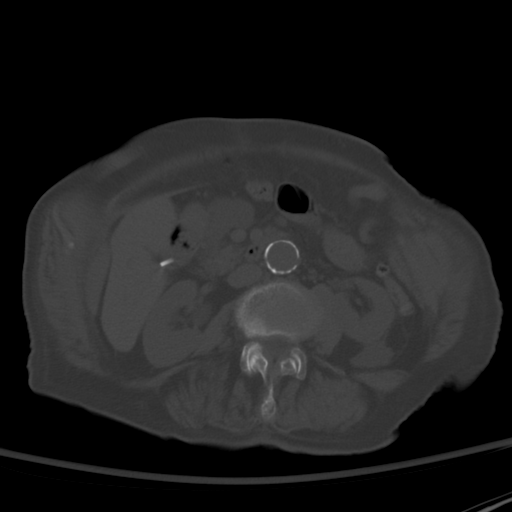
[im 47/67  soft-tissue]
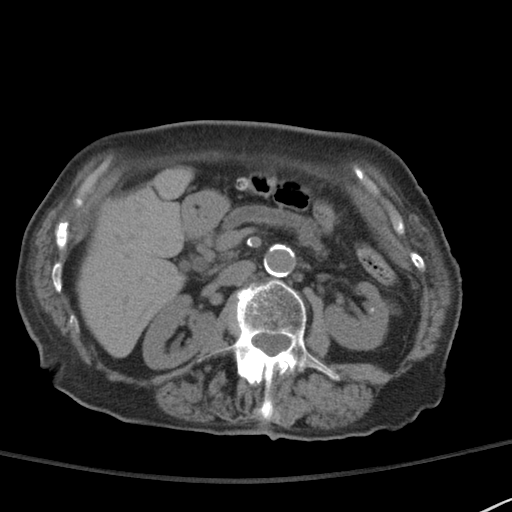
[im 54/67  soft-tissue]
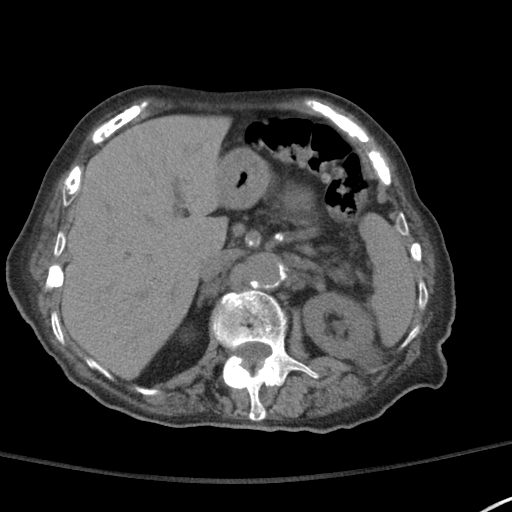
[im 58/67  soft-tissue]
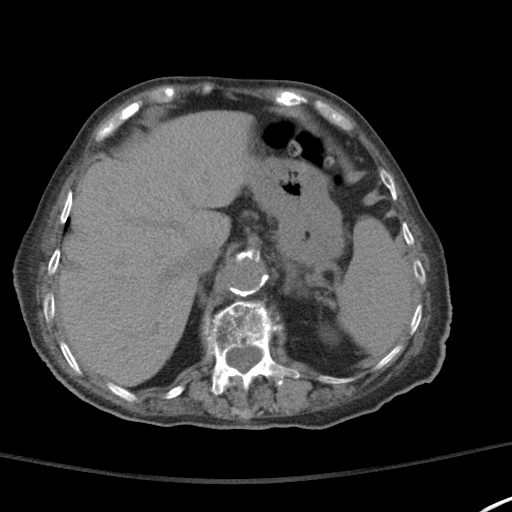
[im 62/67  soft-tissue]
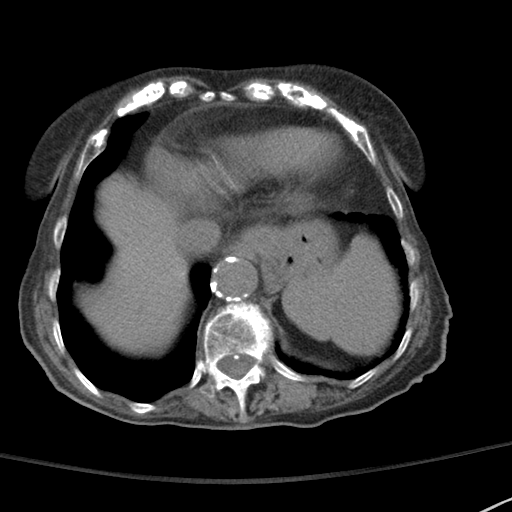

[Series 5: renal stone 3.0 coronal · coronal · 0.59mm/px · 3 of 78 slices shown]
[im 26/78  soft-tissue]
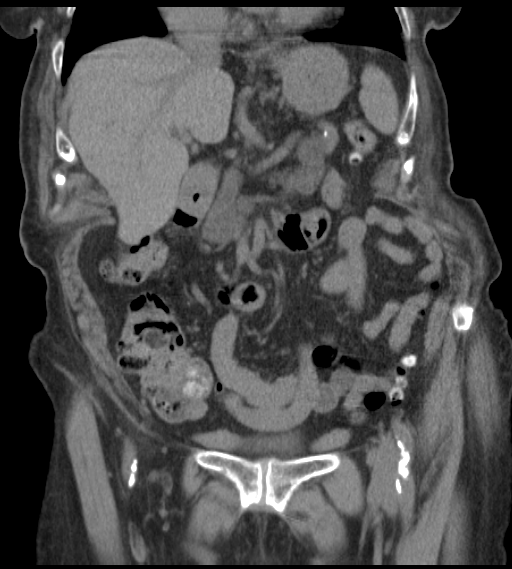
[im 35/78  soft-tissue]
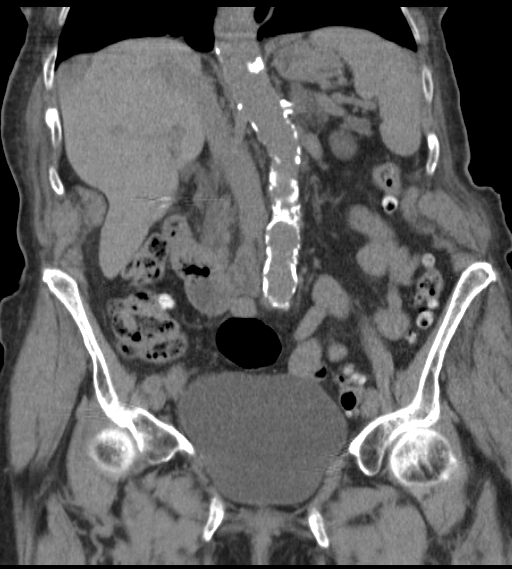
[im 43/78  soft-tissue]
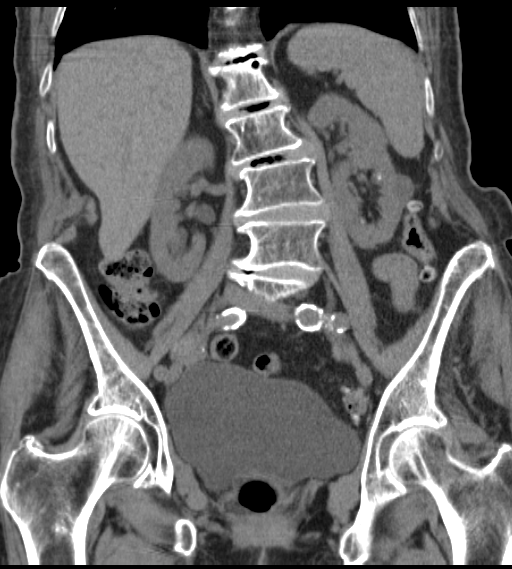

[16 of 46 positions shown; findings below may reference images not displayed]

FINDINGS: Lung bases are unremarkable.  Sagittal images of the
spine shows osteopenia and extensive generative changes lumbar
spine.  Atherosclerotic calcifications are noted abdominal aorta,
bilateral renal artery origin, SMA, splenic artery and the iliac
arteries.  No aortic aneurysm.

The patient is status post cholecystectomy.  There is a
calcification in the right hepatic hilum measures 6 mm.  This
probable represents a calcified lymph node.  Less likely a dropped
gallstone.  There are colonic diverticula in transverse colon and
descending colon.  Colonic diverticula are noted in the right
colon.  Multiple sigmoid colon diverticula.  No evidence of acute
diverticulitis.

Unenhanced right kidney shows a probable cyst lower pole anterior
aspect measures 1.5 cm.  There is  a cyst in the mid pole anterior
aspect of the left kidney measures 2 cm.  Probable cyst lower pole
posterior aspect of the left kidney measures 2.8 cm.  Probable cyst
mid pole posterior aspect of the left kidney measures 2.1 cm.
Nonobstructive calcified calculus in the lower pole of the left
kidney measures 2.7 mm.

No calcified ureteral calculi are noted. No hydronephrosis or
hydroureter.

A distended urinary bladder is noted.  The uterus is atrophic.

In axial image 55 there is a polypoid increased density material in
the right posterior aspect of the urinary bladder.  This is best
visualized in the coronal image 59 measures about 1 cm.  Although
this may represent a small blood clot a bladder lesion cannot be
excluded.  Clinical correlation is necessary.  Further evaluation
with cystoscopy could be performed as clinically warranted.
IMPRESSION: 1. The patient is status post cholecystectomy.  A calcification in
the hepatic hilum measures 6 mm.  This most likely represents a
calcified lymph node rather than a dropped gallstone.
2.  Left nonobstructive nephrolithiasis.  No hydronephrosis or
hydroureter.  Bilateral probable renal cysts.
3.  Multiple colonic diverticula without evidence of acute
diverticulitis.

4.  There is a polypoid high density material in the right
posterior aspect of the urinary bladder best visualized on coronal
image 59 measures about 1 cm.  Although may represent a small blood
clot a urinary bladder lesion cannot be excluded.  Clinical
correlation is necessary.  Further evaluation cystoscopy could be
performed.

## 2013-12-17 ENCOUNTER — Inpatient Hospital Stay (HOSPITAL_COMMUNITY)
Admission: EM | Admit: 2013-12-17 | Discharge: 2013-12-22 | DRG: 480 | Disposition: A | Discharge status: Skilled Nursing Facility | Payer: Medicare Other | Attending: Internal Medicine | Admitting: Internal Medicine

## 2013-12-17 ENCOUNTER — Inpatient Hospital Stay (HOSPITAL_COMMUNITY): Payer: Medicare Other

## 2013-12-17 ENCOUNTER — Encounter (HOSPITAL_COMMUNITY): Payer: Medicare Other | Admitting: Anesthesiology

## 2013-12-17 ENCOUNTER — Encounter (HOSPITAL_COMMUNITY)
Admission: EM | Disposition: A | Discharge status: Skilled Nursing Facility | Payer: Self-pay | Source: Home / Self Care | Attending: Internal Medicine

## 2013-12-17 ENCOUNTER — Emergency Department (HOSPITAL_COMMUNITY): Payer: Medicare Other

## 2013-12-17 ENCOUNTER — Encounter (HOSPITAL_COMMUNITY): Payer: Self-pay | Admitting: Emergency Medicine

## 2013-12-17 ENCOUNTER — Inpatient Hospital Stay (HOSPITAL_COMMUNITY): Payer: Medicare Other | Admitting: Anesthesiology

## 2013-12-17 DIAGNOSIS — M129 Arthropathy, unspecified: Secondary | ICD-10-CM | POA: Diagnosis present

## 2013-12-17 DIAGNOSIS — I959 Hypotension, unspecified: Secondary | ICD-10-CM | POA: Diagnosis present

## 2013-12-17 DIAGNOSIS — N390 Urinary tract infection, site not specified: Secondary | ICD-10-CM

## 2013-12-17 DIAGNOSIS — N179 Acute kidney failure, unspecified: Secondary | ICD-10-CM

## 2013-12-17 DIAGNOSIS — K219 Gastro-esophageal reflux disease without esophagitis: Secondary | ICD-10-CM | POA: Diagnosis present

## 2013-12-17 DIAGNOSIS — M4120 Other idiopathic scoliosis, site unspecified: Secondary | ICD-10-CM | POA: Diagnosis present

## 2013-12-17 DIAGNOSIS — I359 Nonrheumatic aortic valve disorder, unspecified: Secondary | ICD-10-CM | POA: Diagnosis present

## 2013-12-17 DIAGNOSIS — R9431 Abnormal electrocardiogram [ECG] [EKG]: Secondary | ICD-10-CM | POA: Diagnosis not present

## 2013-12-17 DIAGNOSIS — S7290XA Unspecified fracture of unspecified femur, initial encounter for closed fracture: Secondary | ICD-10-CM

## 2013-12-17 DIAGNOSIS — S72001A Fracture of unspecified part of neck of right femur, initial encounter for closed fracture: Secondary | ICD-10-CM

## 2013-12-17 DIAGNOSIS — D72829 Elevated white blood cell count, unspecified: Secondary | ICD-10-CM

## 2013-12-17 DIAGNOSIS — D649 Anemia, unspecified: Secondary | ICD-10-CM

## 2013-12-17 DIAGNOSIS — R0600 Dyspnea, unspecified: Secondary | ICD-10-CM

## 2013-12-17 DIAGNOSIS — Z66 Do not resuscitate: Secondary | ICD-10-CM | POA: Diagnosis present

## 2013-12-17 DIAGNOSIS — N3289 Other specified disorders of bladder: Secondary | ICD-10-CM

## 2013-12-17 DIAGNOSIS — I509 Heart failure, unspecified: Secondary | ICD-10-CM | POA: Diagnosis present

## 2013-12-17 DIAGNOSIS — S7291XS Unspecified fracture of right femur, sequela: Secondary | ICD-10-CM

## 2013-12-17 DIAGNOSIS — M810 Age-related osteoporosis without current pathological fracture: Secondary | ICD-10-CM | POA: Diagnosis present

## 2013-12-17 DIAGNOSIS — E43 Unspecified severe protein-calorie malnutrition: Secondary | ICD-10-CM | POA: Diagnosis present

## 2013-12-17 DIAGNOSIS — E46 Unspecified protein-calorie malnutrition: Secondary | ICD-10-CM

## 2013-12-17 DIAGNOSIS — W010XXA Fall on same level from slipping, tripping and stumbling without subsequent striking against object, initial encounter: Secondary | ICD-10-CM | POA: Diagnosis present

## 2013-12-17 DIAGNOSIS — R339 Retention of urine, unspecified: Secondary | ICD-10-CM

## 2013-12-17 DIAGNOSIS — S72009A Fracture of unspecified part of neck of unspecified femur, initial encounter for closed fracture: Principal | ICD-10-CM | POA: Diagnosis present

## 2013-12-17 DIAGNOSIS — Z87891 Personal history of nicotine dependence: Secondary | ICD-10-CM

## 2013-12-17 DIAGNOSIS — I10 Essential (primary) hypertension: Secondary | ICD-10-CM | POA: Diagnosis present

## 2013-12-17 DIAGNOSIS — J810 Acute pulmonary edema: Secondary | ICD-10-CM

## 2013-12-17 DIAGNOSIS — E871 Hypo-osmolality and hyponatremia: Secondary | ICD-10-CM

## 2013-12-17 DIAGNOSIS — Z681 Body mass index (BMI) 19 or less, adult: Secondary | ICD-10-CM

## 2013-12-17 DIAGNOSIS — S7291XA Unspecified fracture of right femur, initial encounter for closed fracture: Secondary | ICD-10-CM | POA: Diagnosis present

## 2013-12-17 DIAGNOSIS — H5789 Other specified disorders of eye and adnexa: Secondary | ICD-10-CM | POA: Diagnosis present

## 2013-12-17 DIAGNOSIS — D62 Acute posthemorrhagic anemia: Secondary | ICD-10-CM | POA: Diagnosis present

## 2013-12-17 DIAGNOSIS — W19XXXA Unspecified fall, initial encounter: Secondary | ICD-10-CM

## 2013-12-17 DIAGNOSIS — J9600 Acute respiratory failure, unspecified whether with hypoxia or hypercapnia: Secondary | ICD-10-CM | POA: Diagnosis not present

## 2013-12-17 DIAGNOSIS — M1990 Unspecified osteoarthritis, unspecified site: Secondary | ICD-10-CM | POA: Diagnosis present

## 2013-12-17 HISTORY — PX: FEMUR IM NAIL: SHX1597

## 2013-12-17 HISTORY — DX: Transient cerebral ischemic attack, unspecified: G45.9

## 2013-12-17 LAB — COMPREHENSIVE METABOLIC PANEL
ALT: 18 U/L (ref 0–35)
AST: 22 U/L (ref 0–37)
Albumin: 3.4 g/dL — ABNORMAL LOW (ref 3.5–5.2)
Alkaline Phosphatase: 50 U/L (ref 39–117)
BUN: 30 mg/dL — ABNORMAL HIGH (ref 6–23)
CALCIUM: 8.9 mg/dL (ref 8.4–10.5)
CO2: 30 mEq/L (ref 19–32)
Chloride: 101 mEq/L (ref 96–112)
Creatinine, Ser: 0.74 mg/dL (ref 0.50–1.10)
GFR calc non Af Amer: 69 mL/min — ABNORMAL LOW (ref >90)
GFR, EST AFRICAN AMERICAN: 79 mL/min — AB (ref >90)
GLUCOSE: 139 mg/dL — AB (ref 70–99)
Potassium: 3.7 mEq/L (ref 3.7–5.3)
SODIUM: 143 meq/L (ref 137–147)
TOTAL PROTEIN: 6.1 g/dL (ref 6.0–8.3)
Total Bilirubin: 0.9 mg/dL (ref 0.3–1.2)

## 2013-12-17 LAB — CBC WITH DIFFERENTIAL/PLATELET
BASOS ABS: 0 10*3/uL (ref 0.0–0.1)
Basophils Absolute: 0 10*3/uL (ref 0.0–0.1)
Basophils Relative: 0 % (ref 0–1)
Basophils Relative: 0 % (ref 0–1)
EOS ABS: 0.2 10*3/uL (ref 0.0–0.7)
EOS PCT: 2 % (ref 0–5)
Eosinophils Absolute: 0 10*3/uL (ref 0.0–0.7)
Eosinophils Relative: 0 % (ref 0–5)
HCT: 31.8 % — ABNORMAL LOW (ref 36.0–46.0)
HCT: 28.1 % — ABNORMAL LOW (ref 36.0–46.0)
HEMOGLOBIN: 9.4 g/dL — AB (ref 12.0–15.0)
Hemoglobin: 10.6 g/dL — ABNORMAL LOW (ref 12.0–15.0)
LYMPHS ABS: 0.6 10*3/uL — AB (ref 0.7–4.0)
LYMPHS PCT: 7 % — AB (ref 12–46)
Lymphocytes Relative: 6 % — ABNORMAL LOW (ref 12–46)
Lymphs Abs: 0.8 10*3/uL (ref 0.7–4.0)
MCH: 31.2 pg (ref 26.0–34.0)
MCH: 31.2 pg (ref 26.0–34.0)
MCHC: 33.3 g/dL (ref 30.0–36.0)
MCHC: 33.5 g/dL (ref 30.0–36.0)
MCV: 93.5 fL (ref 78.0–100.0)
MCV: 93.4 fL (ref 78.0–100.0)
MONOS PCT: 7 % (ref 3–12)
Monocytes Absolute: 0.5 10*3/uL (ref 0.1–1.0)
Monocytes Absolute: 0.8 10*3/uL (ref 0.1–1.0)
Monocytes Relative: 4 % (ref 3–12)
NEUTROS ABS: 10 10*3/uL — AB (ref 1.7–7.7)
Neutro Abs: 9.5 10*3/uL — ABNORMAL HIGH (ref 1.7–7.7)
Neutrophils Relative %: 87 % — ABNORMAL HIGH (ref 43–77)
Neutrophils Relative %: 87 % — ABNORMAL HIGH (ref 43–77)
PLATELETS: 147 10*3/uL — AB (ref 150–400)
Platelets: 135 10*3/uL — ABNORMAL LOW (ref 150–400)
RBC: 3.4 MIL/uL — ABNORMAL LOW (ref 3.87–5.11)
RBC: 3.01 MIL/uL — ABNORMAL LOW (ref 3.87–5.11)
RDW: 13.7 % (ref 11.5–15.5)
RDW: 13.7 % (ref 11.5–15.5)
WBC: 10.9 10*3/uL — ABNORMAL HIGH (ref 4.0–10.5)
WBC: 11.5 10*3/uL — ABNORMAL HIGH (ref 4.0–10.5)

## 2013-12-17 LAB — BASIC METABOLIC PANEL
BUN: 32 mg/dL — ABNORMAL HIGH (ref 6–23)
CALCIUM: 9.4 mg/dL (ref 8.4–10.5)
CO2: 33 meq/L — AB (ref 19–32)
Chloride: 100 mEq/L (ref 96–112)
Creatinine, Ser: 0.89 mg/dL (ref 0.50–1.10)
GFR calc Af Amer: 61 mL/min — ABNORMAL LOW (ref >90)
GFR, EST NON AFRICAN AMERICAN: 52 mL/min — AB (ref >90)
Glucose, Bld: 146 mg/dL — ABNORMAL HIGH (ref 70–99)
Potassium: 4 mEq/L (ref 3.7–5.3)
SODIUM: 142 meq/L (ref 137–147)

## 2013-12-17 LAB — URINALYSIS, ROUTINE W REFLEX MICROSCOPIC
Bilirubin Urine: NEGATIVE
Glucose, UA: NEGATIVE mg/dL
Ketones, ur: NEGATIVE mg/dL
LEUKOCYTES UA: NEGATIVE
Nitrite: NEGATIVE
PROTEIN: 30 mg/dL — AB
Specific Gravity, Urine: 1.014 (ref 1.005–1.030)
Urobilinogen, UA: 0.2 mg/dL (ref 0.0–1.0)
pH: 7.5 (ref 5.0–8.0)

## 2013-12-17 LAB — URINE MICROSCOPIC-ADD ON

## 2013-12-17 LAB — SURGICAL PCR SCREEN
MRSA, PCR: POSITIVE — AB
Staphylococcus aureus: POSITIVE — AB

## 2013-12-17 LAB — ABO/RH: ABO/RH(D): O POS

## 2013-12-17 SURGERY — INSERTION, INTRAMEDULLARY ROD, FEMUR
Anesthesia: General | Site: Leg Upper | Laterality: Right

## 2013-12-17 MED ORDER — CARVEDILOL 3.125 MG PO TABS
3.1250 mg | ORAL_TABLET | Freq: Two times a day (BID) | ORAL | Status: DC
  Administered 2013-12-18: 3.125 mg via ORAL
  Filled 2013-12-17 (×6): qty 1

## 2013-12-17 MED ORDER — OXYCODONE HCL 5 MG PO TABS: 5.0000 mg | ORAL_TABLET | Freq: Once | ORAL | Status: AC | PRN

## 2013-12-17 MED ORDER — SODIUM CHLORIDE 0.9 % IV SOLN
INTRAVENOUS | Status: DC
  Administered 2013-12-19: 14:00:00 via INTRAVENOUS

## 2013-12-17 MED ORDER — BUPIVACAINE HCL (PF) 0.25 % IJ SOLN
INTRAMUSCULAR | Status: DC | PRN
  Administered 2013-12-17: 20 mL

## 2013-12-17 MED ORDER — BUPIVACAINE HCL (PF) 0.25 % IJ SOLN
INTRAMUSCULAR | Status: AC
Start: 2013-12-17 — End: 2013-12-17
  Filled 2013-12-17: qty 30

## 2013-12-17 MED ORDER — MEPERIDINE HCL 25 MG/ML IJ SOLN: 6.2500 mg | INTRAMUSCULAR | Status: DC | PRN

## 2013-12-17 MED ORDER — CLOBETASOL PROPIONATE 0.05 % EX CREA: 1.0000 "application " | TOPICAL_CREAM | Freq: Two times a day (BID) | CUTANEOUS | Status: DC | PRN

## 2013-12-17 MED ORDER — FENTANYL CITRATE 0.05 MG/ML IJ SOLN
50.0000 ug | INTRAMUSCULAR | Status: DC | PRN
  Administered 2013-12-17: 25 ug via INTRAVENOUS
  Filled 2013-12-17 (×2): qty 2

## 2013-12-17 MED ORDER — LIDOCAINE HCL (CARDIAC) 20 MG/ML IV SOLN
INTRAVENOUS | Status: DC | PRN
  Administered 2013-12-17: 100 mg via INTRAVENOUS

## 2013-12-17 MED ORDER — PROPOFOL 10 MG/ML IV BOLUS
INTRAVENOUS | Status: AC
  Filled 2013-12-17: qty 20

## 2013-12-17 MED ORDER — FENTANYL CITRATE 0.05 MG/ML IJ SOLN
INTRAMUSCULAR | Status: DC | PRN
  Administered 2013-12-17 (×4): 50 ug via INTRAVENOUS

## 2013-12-17 MED ORDER — SILVER SULFADIAZINE 1 % EX CREA: 1.0000 "application " | TOPICAL_CREAM | Freq: Every day | CUTANEOUS | Status: DC | PRN

## 2013-12-17 MED ORDER — HYDROMORPHONE HCL PF 1 MG/ML IJ SOLN
INTRAMUSCULAR | Status: AC
  Filled 2013-12-17: qty 1

## 2013-12-17 MED ORDER — HYDROMORPHONE HCL PF 1 MG/ML IJ SOLN
0.2500 mg | INTRAMUSCULAR | Status: DC | PRN
  Administered 2013-12-17 (×2): 0.25 mg via INTRAVENOUS

## 2013-12-17 MED ORDER — ONDANSETRON HCL 4 MG/2ML IJ SOLN
4.0000 mg | Freq: Once | INTRAMUSCULAR | Status: AC | PRN
  Administered 2013-12-17: 4 mg via INTRAVENOUS

## 2013-12-17 MED ORDER — FENTANYL CITRATE 0.05 MG/ML IJ SOLN
INTRAMUSCULAR | Status: AC
  Filled 2013-12-17: qty 5

## 2013-12-17 MED ORDER — CEFAZOLIN SODIUM-DEXTROSE 2-3 GM-% IV SOLR
INTRAVENOUS | Status: DC | PRN
  Administered 2013-12-17: 2 g via INTRAVENOUS

## 2013-12-17 MED ORDER — CALCIUM CHLORIDE 10 % IV SOLN
INTRAVENOUS | Status: DC | PRN
  Administered 2013-12-17: .2 g via INTRAVENOUS
  Administered 2013-12-17: .1 g via INTRAVENOUS
  Administered 2013-12-17 (×2): .2 g via INTRAVENOUS
  Administered 2013-12-17: .3 g via INTRAVENOUS

## 2013-12-17 MED ORDER — ONDANSETRON HCL 4 MG/2ML IJ SOLN
INTRAMUSCULAR | Status: AC
  Filled 2013-12-17: qty 2

## 2013-12-17 MED ORDER — HYDROCODONE-ACETAMINOPHEN 5-325 MG PO TABS: 1.0000 | ORAL_TABLET | ORAL | Status: DC | PRN

## 2013-12-17 MED ORDER — OXYCODONE HCL 5 MG PO TABS
ORAL_TABLET | ORAL | Status: AC
  Filled 2013-12-17: qty 1

## 2013-12-17 MED ORDER — GLYCOPYRROLATE 0.2 MG/ML IJ SOLN
INTRAMUSCULAR | Status: DC | PRN
  Administered 2013-12-17: 0.2 mg via INTRAVENOUS

## 2013-12-17 MED ORDER — EPHEDRINE SULFATE 50 MG/ML IJ SOLN
INTRAMUSCULAR | Status: DC | PRN
  Administered 2013-12-17 (×2): 5 mg via INTRAVENOUS
  Administered 2013-12-17: 10 mg via INTRAVENOUS

## 2013-12-17 MED ORDER — ASPIRIN EC 325 MG PO TBEC
325.0000 mg | DELAYED_RELEASE_TABLET | Freq: Every day | ORAL | Status: DC
  Administered 2013-12-18 – 2013-12-22 (×5): 325 mg via ORAL
  Filled 2013-12-17 (×7): qty 1

## 2013-12-17 MED ORDER — MENTHOL 3 MG MT LOZG: 1.0000 | LOZENGE | OROMUCOSAL | Status: DC | PRN

## 2013-12-17 MED ORDER — PHENYLEPHRINE HCL 10 MG/ML IJ SOLN
INTRAMUSCULAR | Status: DC | PRN
  Administered 2013-12-17 (×2): 40 ug via INTRAVENOUS

## 2013-12-17 MED ORDER — METOPROLOL TARTRATE 1 MG/ML IV SOLN: 2.5000 mg | Freq: Four times a day (QID) | INTRAVENOUS | Status: DC

## 2013-12-17 MED ORDER — MIDAZOLAM HCL 2 MG/2ML IJ SOLN
2.0000 mg | Freq: Once | INTRAMUSCULAR | Status: AC
  Administered 2013-12-17: 2 mg via INTRAVENOUS
  Filled 2013-12-17: qty 2

## 2013-12-17 MED ORDER — DOCUSATE SODIUM 100 MG PO CAPS: 100.0000 mg | ORAL_CAPSULE | Freq: Two times a day (BID) | ORAL | Status: AC

## 2013-12-17 MED ORDER — ONDANSETRON HCL 4 MG/2ML IJ SOLN
INTRAMUSCULAR | Status: DC | PRN
  Administered 2013-12-17: 4 mg via INTRAVENOUS

## 2013-12-17 MED ORDER — CEFAZOLIN SODIUM-DEXTROSE 2-3 GM-% IV SOLR
2.0000 g | Freq: Four times a day (QID) | INTRAVENOUS | Status: AC
  Administered 2013-12-17 – 2013-12-18 (×2): 2 g via INTRAVENOUS
  Filled 2013-12-17 (×2): qty 50

## 2013-12-17 MED ORDER — MORPHINE SULFATE 2 MG/ML IJ SOLN
0.5000 mg | INTRAMUSCULAR | Status: DC | PRN
  Administered 2013-12-17 (×3): 0.5 mg via INTRAVENOUS
  Filled 2013-12-17 (×3): qty 1

## 2013-12-17 MED ORDER — ASPIRIN EC 325 MG PO TBEC: 325.0000 mg | DELAYED_RELEASE_TABLET | Freq: Every day | ORAL | Status: DC

## 2013-12-17 MED ORDER — OXYCODONE HCL 5 MG/5ML PO SOLN: 5.0000 mg | Freq: Once | ORAL | Status: AC | PRN

## 2013-12-17 MED ORDER — PHENYLEPHRINE HCL 10 MG/ML IJ SOLN
10.0000 mg | INTRAVENOUS | Status: DC | PRN
  Administered 2013-12-17: 20 ug/min via INTRAVENOUS

## 2013-12-17 MED ORDER — ERYTHROMYCIN 5 MG/GM OP OINT
TOPICAL_OINTMENT | Freq: Three times a day (TID) | OPHTHALMIC | Status: AC
  Administered 2013-12-18: 1 via OPHTHALMIC
  Administered 2013-12-18 – 2013-12-19 (×4): via OPHTHALMIC
  Filled 2013-12-17 (×4): qty 3.5

## 2013-12-17 MED ORDER — SODIUM CHLORIDE 0.9 % IV SOLN
Freq: Once | INTRAVENOUS | Status: AC
  Administered 2013-12-17: 02:00:00 via INTRAVENOUS

## 2013-12-17 MED ORDER — PHENOL 1.4 % MT LIQD: 1.0000 | OROMUCOSAL | Status: DC | PRN

## 2013-12-17 MED ORDER — SUCCINYLCHOLINE CHLORIDE 20 MG/ML IJ SOLN
INTRAMUSCULAR | Status: DC | PRN
  Administered 2013-12-17: 100 mg via INTRAVENOUS

## 2013-12-17 MED ORDER — LACTATED RINGERS IV SOLN
INTRAVENOUS | Status: DC
  Administered 2013-12-17 (×2): via INTRAVENOUS

## 2013-12-17 MED ORDER — HYDROCODONE-ACETAMINOPHEN 5-325 MG PO TABS
1.0000 | ORAL_TABLET | Freq: Four times a day (QID) | ORAL | Status: DC | PRN
  Administered 2013-12-18: 2 via ORAL
  Filled 2013-12-17 (×2): qty 2

## 2013-12-17 MED ORDER — PROPOFOL 10 MG/ML IV BOLUS
INTRAVENOUS | Status: DC | PRN
  Administered 2013-12-17: 80 mg via INTRAVENOUS
  Administered 2013-12-17: 10 mg via INTRAVENOUS

## 2013-12-17 MED ORDER — HYDRALAZINE HCL 20 MG/ML IJ SOLN: 10.0000 mg | INTRAMUSCULAR | Status: DC | PRN

## 2013-12-17 SURGICAL SUPPLY — 41 items
BIT DRILL AO GAMMA 4.2X130 (BIT) ×2 IMPLANT
CLOSURE WOUND 1/2 X4 (GAUZE/BANDAGES/DRESSINGS) ×1
COVER PERINEAL POST (MISCELLANEOUS) ×3 IMPLANT
COVER SURGICAL LIGHT HANDLE (MISCELLANEOUS) ×3 IMPLANT
DRAPE STERI IOBAN 125X83 (DRAPES) ×3 IMPLANT
DRSG EMULSION OIL 3X3 NADH (GAUZE/BANDAGES/DRESSINGS) ×3 IMPLANT
DRSG MEPILEX BORDER 4X4 (GAUZE/BANDAGES/DRESSINGS) ×6 IMPLANT
DRSG TEGADERM 4X4.75 (GAUZE/BANDAGES/DRESSINGS) ×6 IMPLANT
DURAPREP 26ML APPLICATOR (WOUND CARE) ×3 IMPLANT
ELECT REM PT RETURN 9FT ADLT (ELECTROSURGICAL) ×3
ELECTRODE REM PT RTRN 9FT ADLT (ELECTROSURGICAL) ×1 IMPLANT
GLOVE BIO SURGEON STRL SZ7.5 (GLOVE) ×3 IMPLANT
GLOVE BIOGEL PI IND STRL 8 (GLOVE) ×1 IMPLANT
GLOVE BIOGEL PI INDICATOR 8 (GLOVE) ×2
GOWN STRL REUS W/ TWL LRG LVL3 (GOWN DISPOSABLE) ×1 IMPLANT
GOWN STRL REUS W/TWL LRG LVL3 (GOWN DISPOSABLE) ×3
GUIDEROD T2 3X1000 (ROD) ×2 IMPLANT
K-WIRE  3.2X450M STR (WIRE) ×2
K-WIRE 3.2X450M STR (WIRE) ×1
KIT BASIN OR (CUSTOM PROCEDURE TRAY) ×3 IMPLANT
KIT NAIL LONG 10X380MMX125 (Nail) ×2 IMPLANT
KIT ROOM TURNOVER OR (KITS) ×3 IMPLANT
KWIRE 3.2X450M STR (WIRE) IMPLANT
LINER BOOT UNIVERSAL DISP (MISCELLANEOUS) ×3 IMPLANT
MANIFOLD NEPTUNE II (INSTRUMENTS) ×3 IMPLANT
NS IRRIG 1000ML POUR BTL (IV SOLUTION) ×3 IMPLANT
PACK GENERAL/GYN (CUSTOM PROCEDURE TRAY) ×3 IMPLANT
PAD ARMBOARD 7.5X6 YLW CONV (MISCELLANEOUS) ×6 IMPLANT
SCREW LAG GAMMA 3 TI 10.5X90MM (Screw) ×2 IMPLANT
SCREW LOCKING T2 F/T  5X42.5MM (Screw) ×2 IMPLANT
SCREW LOCKING T2 F/T 5X42.5MM (Screw) IMPLANT
SPONGE GAUZE 4X4 12PLY (GAUZE/BANDAGES/DRESSINGS) ×3 IMPLANT
STAPLER VISISTAT 35W (STAPLE) ×3 IMPLANT
STRIP CLOSURE SKIN 1/2X4 (GAUZE/BANDAGES/DRESSINGS) ×1 IMPLANT
SUT MON AB 2-0 CT1 36 (SUTURE) ×3 IMPLANT
SUT VIC AB 0 CT1 27 (SUTURE) ×3
SUT VIC AB 0 CT1 27XBRD ANBCTR (SUTURE) ×1 IMPLANT
TOWEL OR 17X24 6PK STRL BLUE (TOWEL DISPOSABLE) ×3 IMPLANT
TOWEL OR 17X26 10 PK STRL BLUE (TOWEL DISPOSABLE) ×3 IMPLANT
TOWEL OR NON WOVEN STRL DISP B (DISPOSABLE) ×3 IMPLANT
WATER STERILE IRR 1000ML POUR (IV SOLUTION) ×3 IMPLANT

## 2013-12-17 NOTE — OR Nursing (Signed)
Patient experienced delirium post operatively. Unable to reorient to situation. Family present when patient transferred to floor and informed patient was uncooperative, confused, combative,  and unable to exercise proper judgement. Daughter and granddaughter understood, and were diligent to offer reassurance to the patient. Patient became less combative with family present. Dr. Katrinka BlazingSmith aware of patient's mental status prior to discharge from PACU.

## 2013-12-17 NOTE — Op Note (Signed)
DATE OF SURGERY:  12/17/2013  TIME: 6:56 PM  PATIENT NAME:  Briana Schultz  AGE: 80 y.o.  PRE-OPERATIVE DIAGNOSIS:  Right prox femur fracture  POST-OPERATIVE DIAGNOSIS:  SAME  PROCEDURE:  INTRAMEDULLARY (IM) NAIL FEMORAL  SURGEON:  Karyna Bessler, D  ASSISTANT:  Janace LittenBrandon Parry OPA  OPERATIVE IMPLANTS: Stryker Gamma Nail with distal interlock screw  PREOPERATIVE INDICATIONS:  Briana Schultz is a 80 y.o. year old who fell and suffered a hip fracture. She was brought into the ER and then admitted and optimized and then elected for surgical intervention.    The risks benefits and alternatives were discussed with the patient including but not limited to the risks of nonoperative treatment, versus surgical intervention including infection, bleeding, nerve injury, malunion, nonunion, hardware prominence, hardware failure, need for hardware removal, blood clots, cardiopulmonary complications, morbidity, mortality, among others, and they were willing to proceed.    OPERATIVE PROCEDURE:  The patient was brought to the operating room and placed in the supine position. General anesthesia was administered, with a foley. She was placed on the fracture table.  Closed reduction was performed under C-arm guidance. The length of the femur was also measured using fluoroscopy. Time out was then performed after sterile prep and drape. She received preoperative antibiotics.  Incision was made proximal to the greater trochanter. A guidewire was placed in the appropriate position. Confirmation was made on AP and lateral views. The above-named nail was opened. I opened the proximal femur with a reamer. I then placed the nail by hand easily down. I first used a clamp through a small lateral incision that would later be used for the lag screw. With the clamp I was able to better effect a reduction.  I did not need to ream the femur.  Once the nail was completely seated, I placed a guidepin into the femoral  head into the center center position. I measured the length, and then reamed the lateral cortex and up into the head. I then placed the lag screw. Slight compression was applied. Anatomic fixation achieved. Bone quality was mediocre.  I then secured the proximal interlocking bolt, and took off a half a turn, and then removed the instruments, and took final C-arm pictures AP and lateral the entire length of the leg.  The knee is perfect circles technique to place a distal interlock screw.   Anatomic reconstruction was achieved, and the wounds were irrigated copiously and closed with Vicryl followed by staples and sterile gauze for the skin. The patient was awakened and returned to PACU in stable and satisfactory condition. There no complications and the patient tolerated the procedure well.  She will be weightbearing as tolerated, and will be on ASA 325  for a period of four weeks after discharge.   Margarita Ranaimothy Dayra Rapley, M.D.

## 2013-12-17 NOTE — ED Notes (Signed)
Ortho tech paged  

## 2013-12-17 NOTE — Discharge Instructions (Signed)
Bear weight as tolerated on the Right leg  Take ASA 325 daily for DVT px

## 2013-12-17 NOTE — ED Notes (Signed)
Pt BIB EMS. Pt fell this evening, with the right leg underneath her. There is a deformity of the right thigh. EMS attempted to straighten the pt's legs, but she stated it was too painful, so they splinted the pt's leg as it was when she fell. Pt received 8mg  of Morphine and 4mg  Zofran en rout.

## 2013-12-17 NOTE — Progress Notes (Signed)
INITIAL NUTRITION ASSESSMENT  DOCUMENTATION CODES Per approved criteria  -Severe malnutrition in the context of chronic illness -Underweight   INTERVENTION: Offer ensure complete BID once diet advanced.  Encouraged PO intake, reviewed menu with family.   NUTRITION DIAGNOSIS: Malnutrition related to chronic illness as evidenced by severe fat and muscle wasting.   Goal: Briana Schultz to meet >/= 90% of their estimated nutrition needs   Monitor:  Diet advancement, PO intake, supplement acceptance, weight trend, labs  Reason for Assessment: Hip Fracture Protocol  80 y.o. female  Admitting Dx: Femur fracture, right  ASSESSMENT: Briana Schultz living independently at home PTA. Briana Schultz fell at home and admitted for hip fracture. Surgery planned for 6/15.  Briana Schultz somewhat confused due to pain medication. Multiple family members in the room with Briana Schultz. Per granddaughter and daughters Briana Schultz eats three meals per day. Family brings in food and Briana Schultz will heat it up as needed. Breakfast is oatmeal and a banana or a pancake and sausage link, Lunch is MOW which she only eats part of, Dinner is a meat and veggies, small portions. Per family Briana Schultz monitors her blood sugars often but never dx with DM. Family feels Briana Schultz is too strict. Briana Schultz does sometimes drink a strawberry flavored Glucerna.   Nutrition Focused Physical Exam:  Subcutaneous Fat:  Orbital Region: severe depletion  Upper Arm Region: severe depletion Thoracic and Lumbar Region: severe depletion  Muscle:  Temple Region: severe depletion Clavicle Bone Region: severe depletion Clavicle and Acromion Bone Region: severe depletion Scapular Bone Region: severe depletion Dorsal Hand: severe depletion Patellar Region: severe depletion Anterior Thigh Region: severe depletion Posterior Calf Region: severe depletion  Edema: not present   Height: Ht Readings from Last 1 Encounters:  12/17/13 5\' 6"  (1.676 m)    Weight: Wt Readings from Last 1 Encounters:  12/17/13 96 lb  (43.545 kg)    Ideal Body Weight: 59 kg   % Ideal Body Weight: 74%  Wt Readings from Last 10 Encounters:  12/17/13 96 lb (43.545 kg)  12/17/13 96 lb (43.545 kg)  01/22/12 102 lb 15.3 oz (46.7 kg)  01/17/12 102 lb 12.8 oz (46.63 kg)    Usual Body Weight: 96 lb - last dr visit  % Usual Body Weight: 100%  BMI:  Body mass index is 15.5 kg/(m^2).  Estimated Nutritional Needs: Kcal: 1200-1350 Protein: 55-65 grams Fluid: > 1.5 L/day  Skin: intact  Diet Order: NPO  EDUCATION NEEDS: -No education needs identified at this time   Intake/Output Summary (Last 24 hours) at 12/17/13 1619 Last data filed at 12/17/13 0900  Gross per 24 hour  Intake      0 ml  Output      0 ml  Net      0 ml    Last BM: PTA   Labs:   Recent Labs Lab 12/17/13 0141 12/17/13 0635  NA 142 143  K 4.0 3.7  CL 100 101  CO2 33* 30  BUN 32* 30*  CREATININE 0.89 0.74  CALCIUM 9.4 8.9  GLUCOSE 146* 139*    CBG (last 3)  No results found for this basename: GLUCAP,  in the last 72 hours  Scheduled Meds: . carvedilol  3.125 mg Oral BID WC  . erythromycin   Left Eye 3 times per day    Continuous Infusions: . sodium chloride      Past Medical History  Diagnosis Date  . Fall     secondary to weakness as of  [place] OV  03/26/10  .  H/O orthostatic hypotension   . Chest discomfort     anterior  . Hypertension   . Aortic valve stenosis     mild to moderate  . Syncope and collapse 2006  . Arthritis   . GERD (gastroesophageal reflux disease)   . Osteoporosis   . Abnormal EKG   . Scarlet fever   . Scoliosis   . TIA (transient ischemic attack) 1999    Past Surgical History  Procedure Laterality Date  . Cholecystectomy    . Cataract extraction Bilateral     Kendell BaneHeather Arthea Nobel RD, LDN, CNSC [phone] Pager [phone] After Hours Pager

## 2013-12-17 NOTE — Anesthesia Postprocedure Evaluation (Signed)
  Anesthesia Post-op Note  Patient: Briana Schultz  Procedure(s) Performed: Procedure(s): INTRAMEDULLARY (IM) NAIL FEMORAL (Right)  Patient Location: PACU  Anesthesia Type:General  Level of Consciousness: awake, alert , pateint uncooperative and confused  Airway and Oxygen Therapy: Patient Spontanous Breathing  Post-op Pain: mild  Post-op Assessment: Post-op Vital signs reviewed, Patient's Cardiovascular Status Stable, Respiratory Function Stable, Patent Airway, No signs of Nausea or vomiting and Pain level controlled  Post-op Vital Signs: stable  Last Vitals:  Filed Vitals:   12/17/13 2039  BP: 100/77  Pulse: 89  Temp:   Resp: 22    Complications: No apparent anesthesia complications

## 2013-12-17 NOTE — ED Notes (Signed)
Unable to give Fentanyl at this time, due to pt's low BP.

## 2013-12-17 NOTE — Anesthesia Procedure Notes (Signed)
Procedure Name: Intubation Date/Time: 12/17/2013 5:42 PM Performed by: Delia ChimesVOSH, Aryana Wonnacott E Pre-anesthesia Checklist: Patient identified, Timeout performed, Emergency Drugs available, Suction available and Patient being monitored Patient Re-evaluated:Patient Re-evaluated prior to inductionOxygen Delivery Method: Circle system utilized Preoxygenation: Pre-oxygenation with 100% oxygen Intubation Type: IV induction and Cricoid Pressure applied Ventilation: Mask ventilation without difficulty Laryngoscope Size: Mac and 3 Grade View: Grade I Tube type: Oral Tube size: 7.0 mm Number of attempts: 1 Airway Equipment and Method: Stylet Placement Confirmation: ETT inserted through vocal cords under direct vision,  breath sounds checked- equal and bilateral and positive ETCO2 Secured at: 22 cm Tube secured with: Tape Dental Injury: Teeth and Oropharynx as per pre-operative assessment

## 2013-12-17 NOTE — H&P (Signed)
[facility] History and Physical  Briana AdolphHallie M Schultz ZOX:[id number] DOB: 02/08/1913 DOA: 12/17/2013  Referring physician: ER physician. PCP: Pamelia HoitWILSON,Briana HENRY, MD   Chief Complaint: Fall.  HPI: Briana Schultz is a 80 y.o. female with history of hypertension who lives independently had a fall at her house. Patient states she tripped and fell. Denies losing consciousness or hitting her head. Patient was brought to the ER and x-rays reveal proximal right femur fracture. On-call orthopedic surgeon Dr. Eulah PontMurphy was consulted and patient was admitted for further workup. On exam patient denies any chest pain shortness of breath nausea vomiting abdominal pain diarrhea. Patient has been having some irritation of her left eye for last 3 days with no active discharge. Patient's daughter was planning to take patient to ophthalmologist for this. Patient is usually ambulatory at home with walker.  Review of Systems: As presented in the history of presenting illness, rest negative.  Past Medical History  Diagnosis Date  . Fall     secondary to weakness as of  [place] OV  03/26/10  . H/O orthostatic hypotension   . Chest discomfort     anterior  . Hypertension   . Aortic valve stenosis     mild to moderate  . Syncope and collapse 2006  . Arthritis   . GERD (gastroesophageal reflux disease)   . Osteoporosis   . Abnormal EKG   . Scarlet fever   . Scoliosis    Past Surgical History  Procedure Laterality Date  . Cholecystectomy     Social History:  reports that she quit smoking about 24 years ago. She has never used smokeless tobacco. She reports that she does not drink alcohol or use illicit drugs. Where does patient live home. Can patient participate in ADLs? Yes.  No Known Allergies  Family History:  Family History  Problem Relation Age of Onset  . Pneumonia Mother   . Heart failure Father   . Diabetes Father   . Heart disease Sister       Prior to Admission medications    Medication Sig Start Date End Date Taking? Authorizing Provider  ALPRAZolam Prudy Feeler(XANAX) 0.5 MG tablet Take 1 tablet (0.5 mg total) by mouth at bedtime as needed. insomnia 01/24/12  Yes Christina P Rama, MD  amLODipine (NORVASC) 10 MG tablet Take 10 mg by mouth daily.   Yes Historical Provider, MD  calcium carbonate (OS-CAL) 600 MG TABS Take 1,200 mg by mouth daily with breakfast.    Yes Historical Provider, MD  carvedilol (COREG) 3.125 MG tablet Take 1 tablet (3.125 mg total) by mouth 2 (two) times daily with a meal. 01/24/12  Yes Maryruth Bunhristina P Rama, MD  cholecalciferol (VITAMIN D) 1000 UNITS tablet Take 5,000 Units by mouth once a week. On Fridays. Confirmed by family member   Yes Historical Provider, MD  esomeprazole (NEXIUM) 40 MG capsule Take 40 mg by mouth 2 (two) times daily before a meal.   Yes Historical Provider, MD  feeding supplement (GLUCERNA SHAKE) LIQD Take 237 mLs by mouth daily.  01/19/12  Yes Vassie Lollarlos Madera, MD  halobetasol (ULTRAVATE) 0.05 % cream Apply 1 application topically 2 (two) times daily as needed (rash).    Yes Historical Provider, MD  meloxicam (MOBIC) 7.5 MG tablet Take 7.5 mg by mouth 2 (two) times daily.   Yes Historical Provider, MD  Multiple Vitamin (MULTIVITAMIN WITH MINERALS) TABS Take 1 tablet by mouth daily.   Yes Historical Provider, MD  Pramoxine-Menthol-Petrolatum [facility](SARNA ULTRA EX) Apply 1 application topically  daily as needed (itching).   Yes Historical Provider, MD  sennosides-docusate sodium (SENOKOT-S) 8.6-50 MG tablet Take 1 tablet by mouth daily as needed for constipation.   Yes Historical Provider, MD  silver sulfADIAZINE (SILVADENE) 1 % cream Apply 1 application topically daily as needed (rash).   Yes Historical Provider, MD  valsartan-hydrochlorothiazide (DIOVAN-HCT) 160-25 MG per tablet Take 1 tablet by mouth daily.   Yes Historical Provider, MD    Physical Exam: Filed Vitals:   12/17/13 0410 12/17/13 0423 12/17/13 0430 12/17/13 0435  BP: 120/50 107/45  127/42 114/51  Pulse: 71 70 91 65  Resp: 19 17 18 19   SpO2: 100% 100% 100% 100%     General:  Well developed and moderately nourished.  Eyes: Left eye is erythematous around the skin area. No active discharge.  ENT: No discharge from the ears nose mouth.  Neck: No mass felt.  Cardiovascular: S1-S2 heard.  Respiratory: No rhonchi or crepitations.  Abdomen: Soft nontender bowel sounds present no guarding rigidity.  Skin: Erythema around the left eye.  Musculoskeletal: Mild swelling in the right hip area.  Psychiatric: Appears normal.  Neurologic: Alert awake oriented to time place and person. Moves all extremities.  Labs on Admission:  Basic Metabolic Panel:  Recent Labs Lab 12/17/13 0141  NA 142  K 4.0  CL 100  CO2 33*  GLUCOSE 146*  BUN 32*  CREATININE 0.89  CALCIUM 9.4   Liver Function Tests: No results found for this basename: AST, ALT, ALKPHOS, BILITOT, PROT, ALBUMIN,  in the last 168 hours No results found for this basename: LIPASE, AMYLASE,  in the last 168 hours No results found for this basename: AMMONIA,  in the last 168 hours CBC:  Recent Labs Lab 12/17/13 0141  WBC 10.9*  NEUTROABS 9.5*  HGB 10.6*  HCT 31.8*  MCV 93.5  PLT 147*   Cardiac Enzymes: No results found for this basename: CKTOTAL, CKMB, CKMBINDEX, TROPONINI,  in the last 168 hours  BNP (last 3 results) No results found for this basename: PROBNP,  in the last 8760 hours CBG: No results found for this basename: GLUCAP,  in the last 168 hours  Radiological Exams on Admission: Dg Tibia/fibula Right  12/17/2013   CLINICAL DATA:  Status post fall from standing; right leg pain.  EXAM: RIGHT TIBIA AND FIBULA - 2 VIEW  COMPARISON:  Right knee radiographs performed 12/11/2008  FINDINGS: The tibia and fibula appear intact. Visualized joint spaces are preserved, though the knee is incompletely assessed on this study. The ankle mortise is incompletely assessed, but appears grossly  unremarkable. No significant soft tissue abnormalities are characterized on radiograph. Scattered vascular calcifications are seen.  IMPRESSION: No evidence of fracture or dislocation.   Electronically Signed   By: Roanna RaiderJeffery  Chang M.D.   On: 12/17/2013 02:30   Ct Head Wo Contrast  12/17/2013   CLINICAL DATA:  Fall, femur fracture.  EXAM: CT HEAD WITHOUT CONTRAST  TECHNIQUE: Contiguous axial images were obtained from the base of the skull through the vertex without intravenous contrast.  COMPARISON:  None.  FINDINGS: The ventricles and sulci are normal for age. No intraparenchymal hemorrhage, mass effect nor midline shift. Patchy supratentorial white matter hypodensities are less than expected for patient's age and though non-specific suggest sequelae of chronic small vessel ischemic disease. No acute large vascular territory infarcts.  No abnormal extra-axial fluid collections. Basal cisterns are patent. Moderate calcific atherosclerosis of the carotid siphons.  No skull fracture. The included ocular globes and  orbital contents are non-suspicious. Status post apparent bilateral ocular lens implants. Left frontal sinus mucosal thickening without paranasal sinus air-fluid levels. The mastoid air cells are well aerated. 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  IMPRESSION: No acute intracranial process.  Involutional changes. Mild to moderate white matter changes are less than expected for age may reflect chronic small vessel ischemic disease.   Electronically Signed   By: Awilda Metro   On: 12/17/2013 05:03   Dg Pelvis Portable  12/17/2013   CLINICAL DATA:  Status post fall; right leg pain and deformity.  EXAM: PORTABLE PELVIS 1-2 VIEWS  COMPARISON:  Pelvis radiograph performed 02/08/2012  FINDINGS: There is a mildly comminuted fracture involving the right proximal femoral diaphysis, with a displaced relatively large lesser trochanteric fragment. 3-4 cm of shortening is noted. No additional  fractures are seen. The femoral heads remain seated in their respective acetabula.  The sacroiliac joints are unremarkable in appearance. No additional fractures are seen. The visualized bowel gas pattern is grossly unremarkable.  IMPRESSION: Mildly comminuted fracture involving the right proximal femoral diaphysis, with a displaced relatively large lesser trochanteric fragment. 3-4 cm of shortening noted at the fracture site.   Electronically Signed   By: Roanna Raider M.D.   On: 12/17/2013 02:23   Dg Chest Portable 1 View  12/17/2013   CLINICAL DATA:  Status post fall.  Preoperative chest radiograph.  EXAM: PORTABLE CHEST - 1 VIEW  COMPARISON:  Chest radiograph performed 03/24/2012  FINDINGS: The lungs are well-aerated and clear. There is no evidence of focal opacification, pleural effusion or pneumothorax.  The cardiomediastinal silhouette is mildly enlarged. No acute osseous abnormalities are seen.  IMPRESSION: No acute cardiopulmonary process seen; mild cardiomegaly noted. No displaced rib fractures identified.   Electronically Signed   By: Roanna Raider M.D.   On: 12/17/2013 02:29   Dg Femur Right Port  12/17/2013   CLINICAL DATA:  Status post fall; right femoral deformity.  EXAM: PORTABLE RIGHT FEMUR - 2 VIEW  COMPARISON:  Right knee radiographs performed 12/11/2008  FINDINGS: There is a displaced mildly comminuted fracture involving the right proximal femoral diaphysis, with a relatively large lesser trochanteric fragment. Shortening is noted at the fracture site, with external rotation of the right leg.  No additional fractures are seen. Diffuse vascular calcifications noted. Mild degenerative change at the right knee is better characterized on the current knee radiographs.  IMPRESSION: Displaced mildly comminuted fracture involving the right proximal femoral diaphysis, with a relatively large lesser trochanteric fragment. Shortening noted at the fracture site, with external rotation of the right  leg.   Electronically Signed   By: Roanna Raider M.D.   On: 12/17/2013 02:28   Dg Knee Right Port  12/17/2013   CLINICAL DATA:  Status post fall from standing. Right leg deformity and pain.  EXAM: PORTABLE RIGHT KNEE - 1-2 VIEW  COMPARISON:  Right knee radiographs performed 12/11/2008 Next  FINDINGS: There is no evidence of fracture or dislocation. There is narrowing of the lateral compartment, worsened from the prior study. Moderate osteophytes are noted arising at the lateral patellofemoral compartments.  Trace joint fluid remains within normal limits. Scattered vascular calcifications are seen.  IMPRESSION: 1. No evidence of fracture or dislocation. 2. Mildly worsened degenerative osteoarthritis at the right knee. 3. Scattered vascular calcifications seen.   Electronically Signed   By: Roanna Raider M.D.   On: 12/17/2013 02:26    EKG: Independently reviewed. Normal sinus rhythm with ST-T changes comparable to old EKG.  Assessment/Plan Principal Problem:   Femur fracture, right Active Problems:   HTN (hypertension)   Anemia   Closed right hip fracture   1. Right hip fracture status post mechanical fall - Dr. Eulah Pont to see patient in consult. From medical standpoint of view patient is stable for surgery. Patient is ambulatory and may benefit if surgery considered. In anticipation of surgery patient will be kept n.p.o. pain relief medications. 2. Hypertension - since patient is n.p.o. patient's beta blocker dose has been placed IV metoprolol schedules with when necessary IV hydralazine for systolic blood pressure more than 160. 3. Left eye redness - I have empirically placed patient on erythromycin eye ointment. 4. Chronic anemia - follow CBC.  Patient has mention of aortic stenosis patient presently does not complain of any chest pain shortness of breath.    Code Status: Full code.  Family Communication: Patient's daughter at the bedside.  Disposition Plan: Admit to inpatient.     Chanell Nadeau N. [facility] Pager [phone].  If 7PM-7AM, please contact night-coverage [email or web address] Password [email or web address] 12/17/2013, 5:18 AM

## 2013-12-17 NOTE — Consult Note (Signed)
ORTHOPAEDIC CONSULTATION  REQUESTING PHYSICIAN: Varney Biles, MD  Chief Complaint: RIght femur intertroch/subtroch fracture  HPI: Briana Schultz is a 80 y.o. female who complains of  Pain in the right side after a mechanical fall  Past Medical History  Diagnosis Date  . Fall     secondary to weakness as of  [place]  03/26/10  . H/O orthostatic hypotension   . Chest discomfort     anterior  . Hypertension   . Aortic valve stenosis     mild to moderate  . Syncope and collapse 2006  . Arthritis   . GERD (gastroesophageal reflux disease)   . Osteoporosis   . Abnormal EKG   . Scarlet fever   . Scoliosis    Past Surgical History  Procedure Laterality Date  . Cholecystectomy     History   Social History  . Marital Status: Widowed    Spouse Name: N/A    Number of Children: N/A  . Years of Education: N/A   Social History Main Topics  . Smoking status: Former Smoker    Quit date: 01/16/1989  . Smokeless tobacco: Never Used  . Alcohol Use: No  . Drug Use: No  . Sexual Activity:    Other Topics Concern  . None   Social History Narrative  . None   Family History  Problem Relation Age of Onset  . Pneumonia Mother   . Heart failure Father   . Diabetes Father   . Heart disease Sister    No Known Allergies Prior to Admission medications   Medication Sig Start Date End Date Taking? Authorizing Provider  ALPRAZolam Duanne Moron) 0.5 MG tablet Take 1 tablet (0.5 mg total) by mouth at bedtime as needed. insomnia 01/24/12   Venetia Maxon Rama, MD  amLODipine (NORVASC) 10 MG tablet Take 10 mg by mouth daily.    Historical Provider, MD  bethanechol (URECHOLINE) 10 MG tablet Take 1 tablet (10 mg total) by mouth 3 (three) times daily as needed (Urinary retention). 01/24/12 01/23/13  Venetia Maxon Rama, MD  calcium carbonate (OS-CAL) 600 MG TABS Take 600 mg by mouth 2 (two) times daily with a meal.    Historical Provider, MD  carvedilol (COREG) 3.125 MG tablet Take 1 tablet  (3.125 mg total) by mouth 2 (two) times daily with a meal. 01/24/12   Venetia Maxon Rama, MD  cholecalciferol (VITAMIN D) 1000 UNITS tablet Take 5,000 Units by mouth once a week. On Fridays. Confirmed by family member    Historical Provider, MD  docusate sodium (COLACE) 100 MG capsule Take 100 mg by mouth 2 (two) times daily as needed.    Historical Provider, MD  feeding supplement (GLUCERNA SHAKE) LIQD Take 237 mLs by mouth 2 (two) times daily between meals. 01/19/12   Barton Dubois, MD  furosemide (LASIX) 20 MG tablet Take 1 tablet (20 mg total) by mouth daily. 01/24/12 01/23/13  Venetia Maxon Rama, MD  halobetasol (ULTRAVATE) 0.05 % cream Apply topically 2 (two) times daily.    Historical Provider, MD  irbesartan (AVAPRO) 150 MG tablet Take 150 mg by mouth daily. 01/19/12 01/18/13  Barton Dubois, MD  Multiple Vitamin (MULTIVITAMIN WITH MINERALS) TABS Take 1 tablet by mouth daily.    Historical Provider, MD  omeprazole (PRILOSEC) 20 MG capsule Take 20 mg by mouth daily.    Historical Provider, MD  sodium phosphate Pediatric (FLEET) 3.5-9.5 GM/59ML enema Place 1 enema rectally once.    Historical Provider, MD  Dg Tibia/fibula Right  12/17/2013   CLINICAL DATA:  Status post fall from standing; right leg pain.  EXAM: RIGHT TIBIA AND FIBULA - 2 VIEW  COMPARISON:  Right knee radiographs performed 12/11/2008  FINDINGS: The tibia and fibula appear intact. Visualized joint spaces are preserved, though the knee is incompletely assessed on this study. The ankle mortise is incompletely assessed, but appears grossly unremarkable. No significant soft tissue abnormalities are characterized on radiograph. Scattered vascular calcifications are seen.  IMPRESSION: No evidence of fracture or dislocation.   Electronically Signed   By: Garald Balding M.D.   On: 12/17/2013 02:30   Dg Pelvis Portable  12/17/2013   CLINICAL DATA:  Status post fall; right leg pain and deformity.  EXAM: PORTABLE PELVIS 1-2 VIEWS  COMPARISON:  Pelvis  radiograph performed 02/08/2012  FINDINGS: There is a mildly comminuted fracture involving the right proximal femoral diaphysis, with a displaced relatively large lesser trochanteric fragment. 3-4 cm of shortening is noted. No additional fractures are seen. The femoral heads remain seated in their respective acetabula.  The sacroiliac joints are unremarkable in appearance. No additional fractures are seen. The visualized bowel gas pattern is grossly unremarkable.  IMPRESSION: Mildly comminuted fracture involving the right proximal femoral diaphysis, with a displaced relatively large lesser trochanteric fragment. 3-4 cm of shortening noted at the fracture site.   Electronically Signed   By: Garald Balding M.D.   On: 12/17/2013 02:23   Dg Chest Portable 1 View  12/17/2013   CLINICAL DATA:  Status post fall.  Preoperative chest radiograph.  EXAM: PORTABLE CHEST - 1 VIEW  COMPARISON:  Chest radiograph performed 03/24/2012  FINDINGS: The lungs are well-aerated and clear. There is no evidence of focal opacification, pleural effusion or pneumothorax.  The cardiomediastinal silhouette is mildly enlarged. No acute osseous abnormalities are seen.  IMPRESSION: No acute cardiopulmonary process seen; mild cardiomegaly noted. No displaced rib fractures identified.   Electronically Signed   By: Garald Balding M.D.   On: 12/17/2013 02:29   Dg Femur Right Port  12/17/2013   CLINICAL DATA:  Status post fall; right femoral deformity.  EXAM: PORTABLE RIGHT FEMUR - 2 VIEW  COMPARISON:  Right knee radiographs performed 12/11/2008  FINDINGS: There is a displaced mildly comminuted fracture involving the right proximal femoral diaphysis, with a relatively large lesser trochanteric fragment. Shortening is noted at the fracture site, with external rotation of the right leg.  No additional fractures are seen. Diffuse vascular calcifications noted. Mild degenerative change at the right knee is better characterized on the current knee  radiographs.  IMPRESSION: Displaced mildly comminuted fracture involving the right proximal femoral diaphysis, with a relatively large lesser trochanteric fragment. Shortening noted at the fracture site, with external rotation of the right leg.   Electronically Signed   By: Garald Balding M.D.   On: 12/17/2013 02:28   Dg Knee Right Port  12/17/2013   CLINICAL DATA:  Status post fall from standing. Right leg deformity and pain.  EXAM: PORTABLE RIGHT KNEE - 1-2 VIEW  COMPARISON:  Right knee radiographs performed 12/11/2008 Next  FINDINGS: There is no evidence of fracture or dislocation. There is narrowing of the lateral compartment, worsened from the prior study. Moderate osteophytes are noted arising at the lateral patellofemoral compartments.  Trace joint fluid remains within normal limits. Scattered vascular calcifications are seen.  IMPRESSION: 1. No evidence of fracture or dislocation. 2. Mildly worsened degenerative osteoarthritis at the right knee. 3. Scattered vascular calcifications seen.   Electronically  Signed   By: Garald Balding M.D.   On: 12/17/2013 02:26    Positive ROS: All other systems have been reviewed and were otherwise negative with the exception of those mentioned in the HPI and as above.  Labs cbc  Recent Labs  12/17/13 0141  WBC 10.9*  HGB 10.6*  HCT 31.8*  PLT 147*    Labs inflam No results found for this basename: ESR, CRP,  in the last 72 hours  Labs coag No results found for this basename: INR, PT, PTT,  in the last 72 hours   Recent Labs  12/17/13 0141  NA 142  K 4.0  CL 100  CO2 33*  GLUCOSE 146*  BUN 32*  CREATININE 0.89  CALCIUM 9.4    Physical Exam: Filed Vitals:   12/17/13 0217  BP: 137/46  Pulse: 68  Resp: 19   General: Alert, no acute distress Cardiovascular: No pedal edema Respiratory: No cyanosis, no use of accessory musculature GI: No organomegaly, abdomen is soft and non-tender Skin: No lesions in the area of chief  complaint Neurologic: Sensation intact distally Lymphatic: No axillary or cervical lymphadenopathy  MUSCULOSKELETAL:  Pain with any ROM, NVI RLE Other extremities are atraumatic with painless ROM and NVI.  Assessment: Right hip fracture IT/ST  Plan: IM nail today pending clearance Weight Bearing Status: bedrest for now then likely WBAT post op Buck traction pre-op PT VTE px: SCD's and likely ASA post op   Edmonia Lynch, D, MD Cell [phone]   12/17/2013 2:55 AM

## 2013-12-17 NOTE — Progress Notes (Signed)
[facility] PROGRESS NOTE  Briana Schultz ZOX:[id number] DOB: 1912-07-27 DOA: 12/17/2013 PCP: Pamelia Hoit, MD  Assessment/Plan: 80 y.o. female with PMH of HTN, HPL, AS with h/o syncope who lives independently had a fall at her house. Patient states she tripped and fell. Denies losing consciousness or hitting her head. Patient was brought to the ER and x-rays reveal proximal right femur fracture. On-call orthopedic surgeon Dr. Eulah Pont was consulted and patient was admitted for further workup  1. Fall mechanical -->Right hip fracture  - surgery today per Dr. Eulah Pont; unfortunate Pt remains at high risk for surgery due to advanced age, severe AS, possible recent MI (abnormal ECG, patient had intermittent chest pains recenltly) -d/w with patient, family the bedside at length;  Pt wants to walk if possible, and accepted all  periop complications   2. HTN, resume BB, prn hydralazine IV 3. GERD, cont PPI;    D/w patient, family at the bedside -Pt is DNR  Code Status: DNR Family Communication: d/w patient, family at the bedside (indicate person spoken with, relationship, and if by phone, the number) Disposition Plan: snf    Consultants:  ortho  Procedures:  ortho  Antibiotics:  none (indicate start date, and stop date if known)  HPI/Subjective: alert  Objective: Filed Vitals:   12/17/13 0600  BP: 115/41  Pulse: 77  Temp: 97.1 F (36.2 C)  Resp: 20   No intake or output data in the 24 hours ending 12/17/13 0936 Filed Weights   12/17/13 0600  Weight: 51.2 kg (112 lb 14 oz)    Exam:   General:  alert  Cardiovascular: s1,s2 rrr  Respiratory: CTA BL  Abdomen: soft, nt,nd   Musculoskeletal: no LE edema   Data Reviewed: Basic Metabolic Panel:  Recent Labs Lab 12/17/13 0141 12/17/13 0635  NA 142 143  K 4.0 3.7  CL 100 101  CO2 33* 30  GLUCOSE 146* 139*  BUN 32* 30*  CREATININE 0.89 0.74  CALCIUM 9.4 8.9   Liver Function Tests:  Recent  Labs Lab 12/17/13 0635  AST 22  ALT 18  ALKPHOS 50  BILITOT 0.9  PROT 6.1  ALBUMIN 3.4*   No results found for this basename: LIPASE, AMYLASE,  in the last 168 hours No results found for this basename: AMMONIA,  in the last 168 hours CBC:  Recent Labs Lab 12/17/13 0141 12/17/13 0635  WBC 10.9* 11.5*  NEUTROABS 9.5* 10.0*  HGB 10.6* 9.4*  HCT 31.8* 28.1*  MCV 93.5 93.4  PLT 147* 135*   Cardiac Enzymes: No results found for this basename: CKTOTAL, CKMB, CKMBINDEX, TROPONINI,  in the last 168 hours BNP (last 3 results) No results found for this basename: PROBNP,  in the last 8760 hours CBG: No results found for this basename: GLUCAP,  in the last 168 hours  No results found for this or any previous visit (from the past 240 hour(s)).   Studies: Dg Tibia/fibula Right  12/17/2013   CLINICAL DATA:  Status post fall from standing; right leg pain.  EXAM: RIGHT TIBIA AND FIBULA - 2 VIEW  COMPARISON:  Right knee radiographs performed 12/11/2008  FINDINGS: The tibia and fibula appear intact. Visualized joint spaces are preserved, though the knee is incompletely assessed on this study. The ankle mortise is incompletely assessed, but appears grossly unremarkable. No significant soft tissue abnormalities are characterized on radiograph. Scattered vascular calcifications are seen.  IMPRESSION: No evidence of fracture or dislocation.   Electronically Signed   By: Leotis Shames  Chang M.D.   On: 12/17/2013 02:30   Ct Head Wo Contrast  12/17/2013   CLINICAL DATA:  Fall, femur fracture.  EXAM: CT HEAD WITHOUT CONTRAST  TECHNIQUE: Contiguous axial images were obtained from the base of the skull through the vertex without intravenous contrast.  COMPARISON:  None.  FINDINGS: The ventricles and sulci are normal for age. No intraparenchymal hemorrhage, mass effect nor midline shift. Patchy supratentorial white matter hypodensities are less than expected for patient's age and though non-specific suggest  sequelae of chronic small vessel ischemic disease. No acute large vascular territory infarcts.  No abnormal extra-axial fluid collections. Basal cisterns are patent. Moderate calcific atherosclerosis of the carotid siphons.  No skull fracture. The included ocular globes and orbital contents are non-suspicious. Status post apparent bilateral ocular lens implants. Left frontal sinus mucosal thickening without paranasal sinus air-fluid levels. The mastoid air cells are well aerated. 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  IMPRESSION: No acute intracranial process.  Involutional changes. Mild to moderate white matter changes are less than expected for age may reflect chronic small vessel ischemic disease.   Electronically Signed   By: Awilda Metroourtnay  Bloomer   On: 12/17/2013 05:03   Dg Pelvis Portable  12/17/2013   CLINICAL DATA:  Status post fall; right leg pain and deformity.  EXAM: PORTABLE PELVIS 1-2 VIEWS  COMPARISON:  Pelvis radiograph performed 02/08/2012  FINDINGS: There is a mildly comminuted fracture involving the right proximal femoral diaphysis, with a displaced relatively large lesser trochanteric fragment. 3-4 cm of shortening is noted. No additional fractures are seen. The femoral heads remain seated in their respective acetabula.  The sacroiliac joints are unremarkable in appearance. No additional fractures are seen. The visualized bowel gas pattern is grossly unremarkable.  IMPRESSION: Mildly comminuted fracture involving the right proximal femoral diaphysis, with a displaced relatively large lesser trochanteric fragment. 3-4 cm of shortening noted at the fracture site.   Electronically Signed   By: Roanna RaiderJeffery  Chang M.D.   On: 12/17/2013 02:23   Dg Chest Portable 1 View  12/17/2013   CLINICAL DATA:  Status post fall.  Preoperative chest radiograph.  EXAM: PORTABLE CHEST - 1 VIEW  COMPARISON:  Chest radiograph performed 03/24/2012  FINDINGS: The lungs are well-aerated and clear. There is no  evidence of focal opacification, pleural effusion or pneumothorax.  The cardiomediastinal silhouette is mildly enlarged. No acute osseous abnormalities are seen.  IMPRESSION: No acute cardiopulmonary process seen; mild cardiomegaly noted. No displaced rib fractures identified.   Electronically Signed   By: Roanna RaiderJeffery  Chang M.D.   On: 12/17/2013 02:29   Dg Femur Right Port  12/17/2013   CLINICAL DATA:  Status post fall; right femoral deformity.  EXAM: PORTABLE RIGHT FEMUR - 2 VIEW  COMPARISON:  Right knee radiographs performed 12/11/2008  FINDINGS: There is a displaced mildly comminuted fracture involving the right proximal femoral diaphysis, with a relatively large lesser trochanteric fragment. Shortening is noted at the fracture site, with external rotation of the right leg.  No additional fractures are seen. Diffuse vascular calcifications noted. Mild degenerative change at the right knee is better characterized on the current knee radiographs.  IMPRESSION: Displaced mildly comminuted fracture involving the right proximal femoral diaphysis, with a relatively large lesser trochanteric fragment. Shortening noted at the fracture site, with external rotation of the right leg.   Electronically Signed   By: Roanna RaiderJeffery  Chang M.D.   On: 12/17/2013 02:28   Dg Knee Right Port  12/17/2013   CLINICAL DATA:  Status post  fall from standing. Right leg deformity and pain.  EXAM: PORTABLE RIGHT KNEE - 1-2 VIEW  COMPARISON:  Right knee radiographs performed 12/11/2008 Next  FINDINGS: There is no evidence of fracture or dislocation. There is narrowing of the lateral compartment, worsened from the prior study. Moderate osteophytes are noted arising at the lateral patellofemoral compartments.  Trace joint fluid remains within normal limits. Scattered vascular calcifications are seen.  IMPRESSION: 1. No evidence of fracture or dislocation. 2. Mildly worsened degenerative osteoarthritis at the right knee. 3. Scattered vascular  calcifications seen.   Electronically Signed   By: Roanna RaiderJeffery  Chang M.D.   On: 12/17/2013 02:26    Scheduled Meds: . erythromycin   Left Eye 3 times per day  . metoprolol  2.5 mg Intravenous 4 times per day   Continuous Infusions: . sodium chloride      Principal Problem:   Femur fracture, right Active Problems:   HTN (hypertension)   Anemia   Closed right hip fracture    Time spent: >35 minutes     Esperanza SheetsBURIEV, Roneka Gilpin N  [facility] Pager [phone]. If 7PM-7AM, please contact night-coverage at [email or web address], password [email or web address] 12/17/2013, 9:36 AM  LOS: 0 days

## 2013-12-17 NOTE — Anesthesia Preprocedure Evaluation (Signed)
Anesthesia Evaluation  Patient identified by MRN, date of birth, ID band Patient awake    Reviewed: Allergy & Precautions, H&P , NPO status , Patient's Chart, lab work & pertinent test results  Airway Mallampati: I TM Distance: >3 FB Neck ROM: Full    Dental   Pulmonary former smoker,          Cardiovascular hypertension, Pt. on medications + Valvular Problems/Murmurs AS     Neuro/Psych    GI/Hepatic GERD-  Medicated and Controlled,  Endo/Other    Renal/GU      Musculoskeletal   Abdominal   Peds  Hematology   Anesthesia Other Findings   Reproductive/Obstetrics                           Anesthesia Physical Anesthesia Plan  ASA: III  Anesthesia Plan: General   Post-op Pain Management:    Induction: Intravenous  Airway Management Planned: Oral ETT  Additional Equipment:   Intra-op Plan:   Post-operative Plan: Extubation in OR  Informed Consent: I have reviewed the patients History and Physical, chart, labs and discussed the procedure including the risks, benefits and alternatives for the proposed anesthesia with the patient or authorized representative who has indicated his/her understanding and acceptance.     Plan Discussed with: CRNA and Surgeon  Anesthesia Plan Comments:         Anesthesia Quick Evaluation

## 2013-12-17 NOTE — Transfer of Care (Signed)
Immediate Anesthesia Transfer of Care Note  Patient: Briana Schultz  Procedure(s) Performed: Procedure(s): INTRAMEDULLARY (IM) NAIL FEMORAL (Right)  Patient Location: PACU  Anesthesia Type:General  Level of Consciousness: awake, alert  and oriented  Airway & Oxygen Therapy: Patient Spontanous Breathing and Patient connected to nasal cannula oxygen  Post-op Assessment: Report given to PACU RN and Post -op Vital signs reviewed and stable  Post vital signs: Reviewed and stable  Complications: No apparent anesthesia complications

## 2013-12-17 NOTE — ED Provider Notes (Signed)
CSN: [id number]     Arrival date & time 12/17/13  0054 History   First MD Initiated Contact with Patient 12/17/13 0110     Chief Complaint  Patient presents with  . Fall     (Consider location/radiation/quality/duration/timing/severity/associated sxs/prior Treatment) HPI Comments: Pt with cc of fall. Pt has hx of AS. Had a mechanical fall at [place] and landed on right side. Pt has hip pain and thigh pain. No headaches, no LOC. No n/v/f/c. Not on blood thinner.  The history is provided by the patient and a relative.    Past Medical History  Diagnosis Date  . Fall     secondary to weakness as of  [place]  03/26/10  . H/O orthostatic hypotension   . Chest discomfort     anterior  . Hypertension   . Aortic valve stenosis     mild to moderate  . Syncope and collapse 2006  . Arthritis   . GERD (gastroesophageal reflux disease)   . Osteoporosis   . Abnormal EKG   . Scarlet fever   . Scoliosis    Past Surgical History  Procedure Laterality Date  . Cholecystectomy     Family History  Problem Relation Age of Onset  . Pneumonia Mother   . Heart failure Father   . Diabetes Father   . Heart disease Sister    History  Substance Use Topics  . Smoking status: Former Smoker    Quit date: 01/16/1989  . Smokeless tobacco: Never Used  . Alcohol Use: No   OB History   Grav Para Term Preterm Abortions TAB SAB Ect Mult Living                 Review of Systems  Constitutional: Positive for activity change.  HENT: Negative for facial swelling.   Respiratory: Negative for cough, shortness of breath and wheezing.   Cardiovascular: Negative for chest pain.  Gastrointestinal: Negative for nausea, vomiting, abdominal pain, diarrhea, constipation, blood in stool and abdominal distention.  Genitourinary: Negative for hematuria and difficulty urinating.  Musculoskeletal: Positive for arthralgias. Negative for neck pain and neck stiffness.  Skin: Negative for color change.   Neurological: Negative for speech difficulty.  Hematological: Does not bruise/bleed easily.  Psychiatric/Behavioral: Negative for confusion.      Allergies  Review of patient's allergies indicates no known allergies.  Home Medications   Prior to Admission medications   Medication Sig Start Date End Date Taking? Authorizing Provider  ALPRAZolam Prudy Feeler(XANAX) 0.5 MG tablet Take 1 tablet (0.5 mg total) by mouth at bedtime as needed. insomnia 01/24/12   Maryruth Bunhristina P Rama, MD  amLODipine (NORVASC) 10 MG tablet Take 10 mg by mouth daily.    Historical Provider, MD  bethanechol (URECHOLINE) 10 MG tablet Take 1 tablet (10 mg total) by mouth 3 (three) times daily as needed (Urinary retention). 01/24/12 01/23/13  Maryruth Bunhristina P Rama, MD  calcium carbonate (OS-CAL) 600 MG TABS Take 600 mg by mouth 2 (two) times daily with a meal.    Historical Provider, MD  carvedilol (COREG) 3.125 MG tablet Take 1 tablet (3.125 mg total) by mouth 2 (two) times daily with a meal. 01/24/12   Maryruth Bunhristina P Rama, MD  cholecalciferol (VITAMIN D) 1000 UNITS tablet Take 5,000 Units by mouth once a week. On Fridays. Confirmed by family member    Historical Provider, MD  docusate sodium (COLACE) 100 MG capsule Take 100 mg by mouth 2 (two) times daily as needed.    Historical Provider,  MD  feeding supplement (GLUCERNA SHAKE) LIQD Take 237 mLs by mouth 2 (two) times daily between meals. 01/19/12   Vassie Lollarlos Madera, MD  furosemide (LASIX) 20 MG tablet Take 1 tablet (20 mg total) by mouth daily. 01/24/12 01/23/13  Maryruth Bunhristina P Rama, MD  halobetasol (ULTRAVATE) 0.05 % cream Apply topically 2 (two) times daily.    Historical Provider, MD  irbesartan (AVAPRO) 150 MG tablet Take 150 mg by mouth daily. 01/19/12 01/18/13  Vassie Lollarlos Madera, MD  Multiple Vitamin (MULTIVITAMIN WITH MINERALS) TABS Take 1 tablet by mouth daily.    Historical Provider, MD  omeprazole (PRILOSEC) 20 MG capsule Take 20 mg by mouth daily.    Historical Provider, MD  sodium phosphate  Pediatric (FLEET) 3.5-9.5 GM/59ML enema Place 1 enema rectally once.    Historical Provider, MD   BP 137/46  Pulse 68  Resp 19  SpO2 100% Physical Exam  Nursing note and vitals reviewed. Constitutional: She is oriented to person, place, and time. She appears well-developed and well-nourished.  HENT:  Head: Normocephalic and atraumatic.  Eyes: EOM are normal. Pupils are equal, round, and reactive to light.  Neck: Neck supple.  Cardiovascular: Normal rate, regular rhythm, normal heart sounds and intact distal pulses.   No murmur heard. Pulmonary/Chest: Effort normal. No respiratory distress.  Abdominal: Soft. She exhibits no distension. There is no tenderness. There is no rebound and no guarding.  Musculoskeletal:  Right proximal thigh deformity.  Neurological: She is alert and oriented to person, place, and time.  Skin: Skin is warm and dry.    ED Course  Procedures (including critical care time) Labs Review Labs Reviewed  CBC WITH DIFFERENTIAL - Abnormal; Notable for the following:    WBC 10.9 (*)    RBC 3.40 (*)    Hemoglobin 10.6 (*)    HCT 31.8 (*)    Platelets 147 (*)    Neutrophils Relative % 87 (*)    Neutro Abs 9.5 (*)    Lymphocytes Relative 7 (*)    All other components within normal limits  BASIC METABOLIC PANEL - Abnormal; Notable for the following:    CO2 33 (*)    Glucose, Bld 146 (*)    BUN 32 (*)    GFR calc non Af Amer 52 (*)    GFR calc Af Amer 61 (*)    All other components within normal limits  URINALYSIS, ROUTINE W REFLEX MICROSCOPIC - Abnormal; Notable for the following:    Hgb urine dipstick SMALL (*)    Protein, ur 30 (*)    All other components within normal limits  URINE MICROSCOPIC-ADD ON    Imaging Review Dg Tibia/fibula Right  12/17/2013   CLINICAL DATA:  Status post fall from standing; right leg pain.  EXAM: RIGHT TIBIA AND FIBULA - 2 VIEW  COMPARISON:  Right knee radiographs performed 12/11/2008  FINDINGS: The tibia and fibula appear  intact. Visualized joint spaces are preserved, though the knee is incompletely assessed on this study. The ankle mortise is incompletely assessed, but appears grossly unremarkable. No significant soft tissue abnormalities are characterized on radiograph. Scattered vascular calcifications are seen.  IMPRESSION: No evidence of fracture or dislocation.   Electronically Signed   By: Roanna RaiderJeffery  Chang M.D.   On: 12/17/2013 02:30   Dg Pelvis Portable  12/17/2013   CLINICAL DATA:  Status post fall; right leg pain and deformity.  EXAM: PORTABLE PELVIS 1-2 VIEWS  COMPARISON:  Pelvis radiograph performed 02/08/2012  FINDINGS: There is a mildly comminuted  fracture involving the right proximal femoral diaphysis, with a displaced relatively large lesser trochanteric fragment. 3-4 cm of shortening is noted. No additional fractures are seen. The femoral heads remain seated in their respective acetabula.  The sacroiliac joints are unremarkable in appearance. No additional fractures are seen. The visualized bowel gas pattern is grossly unremarkable.  IMPRESSION: Mildly comminuted fracture involving the right proximal femoral diaphysis, with a displaced relatively large lesser trochanteric fragment. 3-4 cm of shortening noted at the fracture site.   Electronically Signed   By: Roanna Raider M.D.   On: 12/17/2013 02:23   Dg Chest Portable 1 View  12/17/2013   CLINICAL DATA:  Status post fall.  Preoperative chest radiograph.  EXAM: PORTABLE CHEST - 1 VIEW  COMPARISON:  Chest radiograph performed 03/24/2012  FINDINGS: The lungs are well-aerated and clear. There is no evidence of focal opacification, pleural effusion or pneumothorax.  The cardiomediastinal silhouette is mildly enlarged. No acute osseous abnormalities are seen.  IMPRESSION: No acute cardiopulmonary process seen; mild cardiomegaly noted. No displaced rib fractures identified.   Electronically Signed   By: Roanna Raider M.D.   On: 12/17/2013 02:29   Dg Femur Right  Port  12/17/2013   CLINICAL DATA:  Status post fall; right femoral deformity.  EXAM: PORTABLE RIGHT FEMUR - 2 VIEW  COMPARISON:  Right knee radiographs performed 12/11/2008  FINDINGS: There is a displaced mildly comminuted fracture involving the right proximal femoral diaphysis, with a relatively large lesser trochanteric fragment. Shortening is noted at the fracture site, with external rotation of the right leg.  No additional fractures are seen. Diffuse vascular calcifications noted. Mild degenerative change at the right knee is better characterized on the current knee radiographs.  IMPRESSION: Displaced mildly comminuted fracture involving the right proximal femoral diaphysis, with a relatively large lesser trochanteric fragment. Shortening noted at the fracture site, with external rotation of the right leg.   Electronically Signed   By: Roanna Raider M.D.   On: 12/17/2013 02:28   Dg Knee Right Port  12/17/2013   CLINICAL DATA:  Status post fall from standing. Right leg deformity and pain.  EXAM: PORTABLE RIGHT KNEE - 1-2 VIEW  COMPARISON:  Right knee radiographs performed 12/11/2008 Next  FINDINGS: There is no evidence of fracture or dislocation. There is narrowing of the lateral compartment, worsened from the prior study. Moderate osteophytes are noted arising at the lateral patellofemoral compartments.  Trace joint fluid remains within normal limits. Scattered vascular calcifications are seen.  IMPRESSION: 1. No evidence of fracture or dislocation. 2. Mildly worsened degenerative osteoarthritis at the right knee. 3. Scattered vascular calcifications seen.   Electronically Signed   By: Roanna Raider M.D.   On: 12/17/2013 02:26     EKG Interpretation None      MDM   Final diagnoses:  Femur fracture, right    DDx includes: - Mechanical falls - ICH - Fractures - Contusions - Soft tissue injury  Pt with fall, and resultant hip fracture. Spoke w/ Dr. Eulah Pont - requesting admission and  npo. Hospitalist to admit.  Derwood Kaplan, MD 12/17/13 [phone]

## 2013-12-17 NOTE — Progress Notes (Signed)
Orthopedic Tech Progress Note Patient Details:  Briana Schultz 09/01/1912 [id number]  Musculoskeletal Traction Type of Traction: Bucks Skin Traction Traction Location: R LE Traction Weight: 5 lbs    Laszlo Ellerby T 12/17/2013, 4:31 AM

## 2013-12-18 ENCOUNTER — Inpatient Hospital Stay (HOSPITAL_COMMUNITY): Payer: Medicare Other

## 2013-12-18 DIAGNOSIS — W19XXXA Unspecified fall, initial encounter: Secondary | ICD-10-CM

## 2013-12-18 DIAGNOSIS — E43 Unspecified severe protein-calorie malnutrition: Secondary | ICD-10-CM | POA: Insufficient documentation

## 2013-12-18 LAB — TROPONIN I

## 2013-12-18 LAB — URINE CULTURE

## 2013-12-18 LAB — HEMOGLOBIN AND HEMATOCRIT, BLOOD
HEMATOCRIT: 18.8 % — AB (ref 36.0–46.0)
Hemoglobin: 6.5 g/dL — CL (ref 12.0–15.0)

## 2013-12-18 LAB — PREPARE RBC (CROSSMATCH)

## 2013-12-18 LAB — VITAMIN D 25 HYDROXY (VIT D DEFICIENCY, FRACTURES): VIT D 25 HYDROXY: 54 ng/mL (ref 30–89)

## 2013-12-18 MED ORDER — FERROUS SULFATE 325 (65 FE) MG PO TABS
325.0000 mg | ORAL_TABLET | Freq: Two times a day (BID) | ORAL | Status: DC
  Administered 2013-12-19 – 2013-12-22 (×7): 325 mg via ORAL
  Filled 2013-12-18 (×10): qty 1

## 2013-12-18 MED ORDER — SODIUM CHLORIDE 0.9 % IV BOLUS (SEPSIS)
250.0000 mL | Freq: Once | INTRAVENOUS | Status: AC
  Administered 2013-12-18: 250 mL via INTRAVENOUS

## 2013-12-18 MED ORDER — ACETAMINOPHEN 325 MG PO TABS
650.0000 mg | ORAL_TABLET | ORAL | Status: DC | PRN
  Administered 2013-12-18 – 2013-12-21 (×9): 650 mg via ORAL
  Filled 2013-12-18 (×9): qty 2

## 2013-12-18 MED ORDER — SODIUM CHLORIDE 0.9 % IV BOLUS (SEPSIS)
500.0000 mL | INTRAVENOUS | Status: DC | PRN
Start: 2013-12-18 — End: 2013-12-22

## 2013-12-18 NOTE — Progress Notes (Signed)
[facility] PROGRESS NOTE  Briana Schultz ZOX:[id number] DOB: 09/18/1912 DOA: 12/17/2013 PCP: Pamelia HoitWILSON,FRED HENRY, MD  Assessment/Plan: 80 y.o. female with PMH of HTN, HPL, AS with h/o syncope who lives independently had a fall at her house. Patient states she tripped and fell. Denies losing consciousness or hitting her head. Patient was brought to the ER and x-rays reveal proximal right femur fracture. On-call orthopedic surgeon Dr. Eulah PontMurphy was consulted and patient was admitted for further workup  1. Fall mechanical -->Right hip fracture;  -Pt at high risk for surgery due to advanced age, severe AS, possible recent MI (abnormal ECG, patient had intermittent chest pains recenltly) -d/w with patient, family the bedside at length;  Pt wants to walk if possible, and accepted all  periop complications -s/p [employer] Gamma Nail with distal interlock screw on 6/15; stable cont pain control; DVT prophylaxis per ortho recommendation; likely SNF -pend PT/OT    2. HTN, resume BB, hold amlodipine, diuretic due low BP; prn hydralazine IV 3. GERD, cont PPI;  4. Mild acute blood loss anemia post op; no obvious s/s of bleeding; star PO iron; con monitor    D/w patient, family at the bedside -Pt is DNR  Code Status: DNR Family Communication: d/w patient, family at the bedside (indicate person spoken with, relationship, and if by phone, the number) Disposition Plan: snf, Pt lives alone    Consultants:  ortho  Procedures:  ortho  Antibiotics:  none (indicate start date, and stop date if known)  HPI/Subjective: alert  Objective: Filed Vitals:   12/18/13 0628  BP: 109/30  Pulse: 75  Temp: 97.5 F (36.4 C)  Resp: 16    Intake/Output Summary (Last 24 hours) at 12/18/13 1006 Last data filed at 12/17/13 2100  Gross per 24 hour  Intake    900 ml  Output    200 ml  Net    700 ml   Filed Weights   12/17/13 0600 12/17/13 1500  Weight: 51.2 kg (112 lb 14 oz) 43.545 kg (96 lb)     Exam:   General:  alert  Cardiovascular: s1,s2 rrr  Respiratory: CTA BL  Abdomen: soft, nt,nd   Musculoskeletal: no LE edema   Data Reviewed: Basic Metabolic Panel:  Recent Labs Lab 12/17/13 0141 12/17/13 0635  NA 142 143  K 4.0 3.7  CL 100 101  CO2 33* 30  GLUCOSE 146* 139*  BUN 32* 30*  CREATININE 0.89 0.74  CALCIUM 9.4 8.9   Liver Function Tests:  Recent Labs Lab 12/17/13 0635  AST 22  ALT 18  ALKPHOS 50  BILITOT 0.9  PROT 6.1  ALBUMIN 3.4*   No results found for this basename: LIPASE, AMYLASE,  in the last 168 hours No results found for this basename: AMMONIA,  in the last 168 hours CBC:  Recent Labs Lab 12/17/13 0141 12/17/13 0635  WBC 10.9* 11.5*  NEUTROABS 9.5* 10.0*  HGB 10.6* 9.4*  HCT 31.8* 28.1*  MCV 93.5 93.4  PLT 147* 135*   Cardiac Enzymes: No results found for this basename: CKTOTAL, CKMB, CKMBINDEX, TROPONINI,  in the last 168 hours BNP (last 3 results) No results found for this basename: PROBNP,  in the last 8760 hours CBG: No results found for this basename: GLUCAP,  in the last 168 hours  Recent Results (from the past 240 hour(s))  SURGICAL PCR SCREEN     Status: Abnormal   Collection Time    12/17/13  3:17 PM      Result  Value Ref Range Status   MRSA, PCR POSITIVE (*) NEGATIVE Final   Staphylococcus aureus POSITIVE (*) NEGATIVE Final   Comment:            The Xpert SA Assay (FDA     approved for NASAL specimens     in patients over 78 years of age),     is one component of     a comprehensive surveillance     program.  Test performance has     been validated by [facility] for patients greater     than or equal to 78 year old.     It is not intended     to diagnose infection nor to     guide or monitor treatment.     Studies: Dg Hip Operative Right  12/17/2013   CLINICAL DATA:  Right trochanteric nail placement  EXAM: DG OPERATIVE RIGHT HIP  FLUOROSCOPY TIME:  1 min 16 seconds  COMPARISON:  None   FINDINGS: Right femoral intra medullary nail placement transfixing an oblique proximal femoral diaphysis fracture. There is mild persistent displacement. There is no failure or complication.  IMPRESSION: Right femoral intra medullary nail transfixing pain oblique proximal femoral diaphysis fracture.   Electronically Signed   By: Elige KoHetal  Patel   On: 12/17/2013 19:37   Dg Femur Right  12/17/2013   CLINICAL DATA:  Postop femoral nail insertion  EXAM: PORTABLE PELVIS 1-2 VIEWS; RIGHT FEMUR - 2 VIEW  COMPARISON:  None.  FINDINGS: Interval placement of a right femoral intramedullary nail transfixing an oblique proximal right femoral diaphysis fracture. The lesser trochanteric fragment is medially displaced. There is no dislocation. There is no hardware failure or complication.  IMPRESSION: ORIF proximal right diaphysis fracture with persistent displacement.   Electronically Signed   By: Elige KoHetal  Patel   On: 12/17/2013 21:12   Dg Tibia/fibula Right  12/17/2013   CLINICAL DATA:  Status post fall from standing; right leg pain.  EXAM: RIGHT TIBIA AND FIBULA - 2 VIEW  COMPARISON:  Right knee radiographs performed 12/11/2008  FINDINGS: The tibia and fibula appear intact. Visualized joint spaces are preserved, though the knee is incompletely assessed on this study. The ankle mortise is incompletely assessed, but appears grossly unremarkable. No significant soft tissue abnormalities are characterized on radiograph. Scattered vascular calcifications are seen.  IMPRESSION: No evidence of fracture or dislocation.   Electronically Signed   By: Roanna RaiderJeffery  Chang M.D.   On: 12/17/2013 02:30   Ct Head Wo Contrast  12/17/2013   CLINICAL DATA:  Fall, femur fracture.  EXAM: CT HEAD WITHOUT CONTRAST  TECHNIQUE: Contiguous axial images were obtained from the base of the skull through the vertex without intravenous contrast.  COMPARISON:  None.  FINDINGS: The ventricles and sulci are normal for age. No intraparenchymal hemorrhage, mass  effect nor midline shift. Patchy supratentorial white matter hypodensities are less than expected for patient's age and though non-specific suggest sequelae of chronic small vessel ischemic disease. No acute large vascular territory infarcts.  No abnormal extra-axial fluid collections. Basal cisterns are patent. Moderate calcific atherosclerosis of the carotid siphons.  No skull fracture. The included ocular globes and orbital contents are non-suspicious. Status post apparent bilateral ocular lens implants. Left frontal sinus mucosal thickening without paranasal sinus air-fluid levels. The mastoid air cells are well aerated. 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  IMPRESSION: No acute intracranial process.  Involutional changes. Mild to moderate white matter changes are less than expected for age  may reflect chronic small vessel ischemic disease.   Electronically Signed   By: Awilda Metro   On: 12/17/2013 05:03   Dg Pelvis Portable  12/17/2013   CLINICAL DATA:  Postop femoral nail insertion  EXAM: PORTABLE PELVIS 1-2 VIEWS; RIGHT FEMUR - 2 VIEW  COMPARISON:  None.  FINDINGS: Interval placement of a right femoral intramedullary nail transfixing an oblique proximal right femoral diaphysis fracture. The lesser trochanteric fragment is medially displaced. There is no dislocation. There is no hardware failure or complication.  IMPRESSION: ORIF proximal right diaphysis fracture with persistent displacement.   Electronically Signed   By: Elige Ko   On: 12/17/2013 21:12   Dg Pelvis Portable  12/17/2013   CLINICAL DATA:  Status post fall; right leg pain and deformity.  EXAM: PORTABLE PELVIS 1-2 VIEWS  COMPARISON:  Pelvis radiograph performed 02/08/2012  FINDINGS: There is a mildly comminuted fracture involving the right proximal femoral diaphysis, with a displaced relatively large lesser trochanteric fragment. 3-4 cm of shortening is noted. No additional fractures are seen. The femoral heads  remain seated in their respective acetabula.  The sacroiliac joints are unremarkable in appearance. No additional fractures are seen. The visualized bowel gas pattern is grossly unremarkable.  IMPRESSION: Mildly comminuted fracture involving the right proximal femoral diaphysis, with a displaced relatively large lesser trochanteric fragment. 3-4 cm of shortening noted at the fracture site.   Electronically Signed   By: Roanna Raider M.D.   On: 12/17/2013 02:23   Dg Chest Portable 1 View  12/17/2013   CLINICAL DATA:  Status post fall.  Preoperative chest radiograph.  EXAM: PORTABLE CHEST - 1 VIEW  COMPARISON:  Chest radiograph performed 03/24/2012  FINDINGS: The lungs are well-aerated and clear. There is no evidence of focal opacification, pleural effusion or pneumothorax.  The cardiomediastinal silhouette is mildly enlarged. No acute osseous abnormalities are seen.  IMPRESSION: No acute cardiopulmonary process seen; mild cardiomegaly noted. No displaced rib fractures identified.   Electronically Signed   By: Roanna Raider M.D.   On: 12/17/2013 02:29   Dg Femur Right Port  12/17/2013   CLINICAL DATA:  Status post fall; right femoral deformity.  EXAM: PORTABLE RIGHT FEMUR - 2 VIEW  COMPARISON:  Right knee radiographs performed 12/11/2008  FINDINGS: There is a displaced mildly comminuted fracture involving the right proximal femoral diaphysis, with a relatively large lesser trochanteric fragment. Shortening is noted at the fracture site, with external rotation of the right leg.  No additional fractures are seen. Diffuse vascular calcifications noted. Mild degenerative change at the right knee is better characterized on the current knee radiographs.  IMPRESSION: Displaced mildly comminuted fracture involving the right proximal femoral diaphysis, with a relatively large lesser trochanteric fragment. Shortening noted at the fracture site, with external rotation of the right leg.   Electronically Signed   By:  Roanna Raider M.D.   On: 12/17/2013 02:28   Dg Knee Right Port  12/17/2013   CLINICAL DATA:  Status post fall from standing. Right leg deformity and pain.  EXAM: PORTABLE RIGHT KNEE - 1-2 VIEW  COMPARISON:  Right knee radiographs performed 12/11/2008 Next  FINDINGS: There is no evidence of fracture or dislocation. There is narrowing of the lateral compartment, worsened from the prior study. Moderate osteophytes are noted arising at the lateral patellofemoral compartments.  Trace joint fluid remains within normal limits. Scattered vascular calcifications are seen.  IMPRESSION: 1. No evidence of fracture or dislocation. 2. Mildly worsened degenerative osteoarthritis at the right  knee. 3. Scattered vascular calcifications seen.   Electronically Signed   By: Roanna Raider M.D.   On: 12/17/2013 02:26    Scheduled Meds: . aspirin EC  325 mg Oral Q breakfast  . carvedilol  3.125 mg Oral BID WC  . erythromycin   Left Eye 3 times per day   Continuous Infusions: . sodium chloride    . lactated ringers 50 mL/hr at 12/17/13 1703    Principal Problem:   Femur fracture, right Active Problems:   HTN (hypertension)   Anemia   Closed right hip fracture    Time spent: >35 minutes     [name]  [facility] Pager [phone]. If 7PM-7AM, please contact night-coverage at [email or web address], password [facility] 12/18/2013, 10:06 AM  LOS: 1 day

## 2013-12-18 NOTE — Progress Notes (Signed)
Utilization Review Completed.Dowell, Deborah T6/16/2015  

## 2013-12-18 NOTE — Progress Notes (Signed)
PT Cancellation Note  Patient Details Name: Dow AdolphHallie M Moody MRN: [id number] DOB: 05/19/1913   Cancelled Treatment:    Reason Eval/Treat Not Completed: Medical issues which prohibited therapy.  BP low per RN.  Will try back another time.   Bohling, Corinne, SPT 12/18/2013, 1:15 PM

## 2013-12-18 NOTE — Progress Notes (Signed)
OT Cancellation Note  Patient Details Name: Dow AdolphHallie M Edmonds MRN: [id number] DOB: 07/29/1912   Cancelled Treatment:    Reason Eval/Treat Not Completed: Other (comment) (Pt is going to SNF at d/c. Deferring OT needs to next venue.)  Pilar GrammesMathews, Kathryn H 12/18/2013, 2:28 PM

## 2013-12-18 NOTE — Progress Notes (Signed)
    Subjective:  Patient reports pain as mild  Objective:   VITALS:   Filed Vitals:   12/17/13 2140 12/18/13 0628 12/18/13 1319 12/18/13 1515  BP: 139/56 109/30 84/27 71/25   Pulse: 97 75 76 77  Temp: 94.6 F (34.8 C) 97.5 F (36.4 C) 97.6 F (36.4 C)   TempSrc:      Resp: 16 16    Height:      Weight:      SpO2: 98% 100% 93%     Physical Exam  Dressing: C/D/I  Compartments soft  SILT DP/SP/S/S/T, 2+DP, +TA/GS/EHL  LABS  No results found for this or any previous visit (from the past 24 hour(s)).   Assessment/Plan: 1 Day Post-Op   Principal Problem:   Femur fracture, right Active Problems:   HTN (hypertension)   Anemia   Closed right hip fracture   Protein-calorie malnutrition, severe   PLAN: Weight Bearing: WBAT Dressings: Change prior to d/c VTE prophylaxis: asa 325, ambulation, scd's Dispo: ok for d/c from ortho standpoint to placement   MURPHY, TIMOTHY, D 12/18/2013, 4:32 PM   Margarita Ranaimothy Murphy, MD Cell [phone]

## 2013-12-18 NOTE — Progress Notes (Signed)
Need PT/OT evaluation to send to insurance for SNF authorization. Pt medically unable to participate on 06/16; CSW following for eval and will send to insurance company when this is complete.   Maryclare LabradorJulie Anderson, MSW, [facility] HospitalCSWA Clinical Social Worker [phone]

## 2013-12-18 NOTE — Progress Notes (Signed)
Pt bp dropping to 80/30, pt difficult to arouse. York SpanielBuriev, MD notified. MD ordered stat CT and 250ml NS bolus. MD at bedside @ 1315. Bolus administered. CT done @ 1400. Post bolus, pt alert but disoriented to all but person. Family at bedside @ 1405. RN rounded on pt noticed return of lethargy @ 1515. BP recheck shows BP 71/25. MD notified. MD ordered stat EKG and 500ml NS bolus. MD at beside @ 1545. Pt continues to be lethargic.

## 2013-12-18 NOTE — Progress Notes (Addendum)
[facility] PROGRESS NOTE  Briana Schultz UJW:[id number] DOB: 08/18/1912 DOA: 12/17/2013 PCP: Pamelia HoitWILSON,FRED HENRY, MD  Patient developed sudden change in mental status, but able to move extremities,  -unfortunately she remains at high risk for multiple complications due to advanced age, severe AS, possible recent MI (abnormal ECG, patient had intermittent chest pains recenltly) -d/w patient, no aggressive intervention, DNR; will obtain CT scan of the head; decrease opioids; gentle IVF for low BP; monitor    Esperanza SheetsBURIEV, [name]  [facility] Pager [phone]. If 7PM-7AM, please contact night-coverage at [email or web address], password [email or web address] 12/18/2013, 1:31 PM  LOS: 1 day     Unfortunately Pt is still declining very fast, hypotensive lethargic, delirious; ? developing CHF due to severe AS, but hypotensive;  -d/w patient's family at the bedside; no aggressive measures, prn IVF; check Hg, trop;    buriev ulugbek 16.00

## 2013-12-18 NOTE — Progress Notes (Signed)
CRITICAL VALUE ALERT  Critical value received:hgb 6.5  Date of notification:  12/18/2013  Time of notification:  1945  Critical value read back:yes Nurse who received alert:  William Daltonana McClain, RN MD notified (1st page):  Donnamarie PoagK. Kirby ([facility] Time of first page:  1947   Responding MD:  Donnamarie PoagK. Kirby  Time MD responded:  1950  With code status and pt's status, see if Family is receptive to blood administration.

## 2013-12-18 NOTE — Progress Notes (Addendum)
Shift event: RN paged this NP with critical value of Hgb 6.5 down from 9.4 after hip fx repair. Pt took a drastic turn for the worse today with altered mental status, possible acute CHF development and hypotension. Day MD discussed this with family who at that time elected no aggressive measures but confirmed DNR status.  Given lab value, this NP called and spoke to the daughter, POA, about a blood transfusion. Explained that "aggressive measures" can mean different things to different people/patients, so this NP was asking for clarification. Explained that it is certainly not unusual to have a surgery like the pt did and not lose some blood. Explained the risks of transfusion including reaction to the blood and volume overload making her CHF worse. Benefits-may improve hypotension since increasing circulating volume. However, again, explained that the increase in volume could set off CHF decompensation. This NP gave daughter the option to transfuse, do nothing, or wait til morning and recheck CBC. Daughter is going to discuss with pt's granddaughters and RN will get back with this NP.  Jimmye NormanKaren Kirby-Graham, NP [facility] Update: 2245-family decided to give one unit of blood. Order placed, H/H after. Craige CottaKirby

## 2013-12-18 NOTE — Progress Notes (Signed)
Agree with SPT.    Megan Ritenour, PT [phone]  

## 2013-12-18 NOTE — Progress Notes (Signed)
Clinical Social Work Department BRIEF PSYCHOSOCIAL ASSESSMENT 12/18/2013  Patient:  Briana Schultz,Briana Schultz     Account Number:  [id number]     Admit date:  12/17/2013  Clinical Social Worker:  Varney BilesANDERSON,JULIE, LCSWA  Date/Time:  12/18/2013 02:00 PM  Referred by:  Physician  Date Referred:  12/18/2013 Referred for  SNF Placement   Other Referral:   Interview type:  Patient Other interview type:   Pt's granddaughter also at bedside    PSYCHOSOCIAL DATA Living Status:  ALONE Admitted from facility:   Level of care:   Primary support name:  Briana Schultz [phone]) Primary support relationship to patient:  CHILD, ADULT Degree of support available:   Good--pt has ample support from daughter, granddaughter, and other extended family members.    CURRENT CONCERNS Current Concerns  Post-Acute Placement   Other Concerns:    SOCIAL WORK ASSESSMENT / PLAN CSW explained SNF recommendation and pt and granddaughter in agreement. [facility] as pt was there about 2 years ago. At this time there is not a bed available; [facility], and this facility has made a bed offer. CSW will follow up with pt/granddaughter with bed offer and will continue to communicate with [facility] for bed availability when pt is ready for discharge. Pt's ultimate goal is to return home.   Assessment/plan status:  Psychosocial Support/Ongoing Assessment of Needs Other assessment/ plan:   Information/referral to community resources:   SNF    PATIENT'S/FAMILY'S RESPONSE TO PLAN OF CARE: Good--pt frienly, participated in conversation with CSW and granddaughter. Understanding of CSW role.       Maryclare LabradorJulie Anderson, MSW, [facility] Center[facility]WA Clinical Social Worker [phone]

## 2013-12-18 NOTE — Progress Notes (Addendum)
Clinical Social Work Department CLINICAL SOCIAL WORK PLACEMENT NOTE 12/18/2013  Patient:  Briana Schultz,Briana M  Account Number:  [id number] Admit date:  12/17/2013  Clinical Social Worker:  Maryclare LabradorJULIE ANDERSON, Theresia MajorsLCSWA  Date/time:  12/18/2013 02:03 PM  Clinical Social Work is seeking post-discharge placement for this patient at the following level of care:   SKILLED NURSING   (*CSW will update this form in Epic as items are completed)   N/A-pt/family requested referrals to specific facilities  Patient/family provided with [facility] of Clinical Social Work's list of facilities offering this level of care within the geographic area requested by the patient (or if unable, by the patient's family).  12/18/2013  Patient/family informed of their freedom to choose among providers that offer the needed level of care, that participate in Medicare, Medicaid or managed care program needed by the patient, have an available bed and are willing to accept the patient.  N/A-no bed available at this time  Patient/family informed of [facility]' ownership interest in [facility], as well as of the fact that they are under no obligation to receive care at this facility.  PASARR submitted to EDS on 12/18/2013 PASARR number received on 12/18/2013  FL2 transmitted to all facilities in geographic area requested by pt/family on  12/18/2013 FL2 transmitted to all facilities within larger geographic area on   Patient informed that his/her managed care company has contracts with or will negotiate with  certain facilities, including the following:     Patient/family informed of bed offers received:  12/18/13 Patient chooses bed at  Physician recommends and patient chooses bed at    Patient to be transferred to  on   Patient to be transferred to facility by  Patient and family notified of transfer on  Name of family member notified:    The following physician request were entered in  Epic:   Additional Comments: Informed pt's daughter Briana Schultz of [facility] offer [facility]. Family still [facility] when [facility] is ready for discharge.   Maryclare LabradorJulie Anderson, MSW, [facility]WA Clinical Social Worker [phone]

## 2013-12-19 ENCOUNTER — Encounter (HOSPITAL_COMMUNITY): Payer: Self-pay | Admitting: Orthopedic Surgery

## 2013-12-19 DIAGNOSIS — I959 Hypotension, unspecified: Secondary | ICD-10-CM

## 2013-12-19 DIAGNOSIS — E46 Unspecified protein-calorie malnutrition: Secondary | ICD-10-CM

## 2013-12-19 DIAGNOSIS — D62 Acute posthemorrhagic anemia: Secondary | ICD-10-CM

## 2013-12-19 LAB — HEMOGLOBIN AND HEMATOCRIT, BLOOD
HCT: 23.8 % — ABNORMAL LOW (ref 36.0–46.0)
Hemoglobin: 7.9 g/dL — ABNORMAL LOW (ref 12.0–15.0)

## 2013-12-19 LAB — TYPE AND SCREEN
ABO/RH(D): O POS
Antibody Screen: NEGATIVE
UNIT DIVISION: 0

## 2013-12-19 LAB — TROPONIN I

## 2013-12-19 MED ORDER — ASPIRIN 325 MG PO TBEC: 325.0000 mg | DELAYED_RELEASE_TABLET | Freq: Every day | ORAL | Status: DC

## 2013-12-19 MED ORDER — ENSURE COMPLETE PO LIQD
237.0000 mL | Freq: Two times a day (BID) | ORAL | Status: DC
  Administered 2013-12-19 – 2013-12-22 (×5): 237 mL via ORAL

## 2013-12-19 MED ORDER — FERROUS SULFATE 325 (65 FE) MG PO TABS: 325.0000 mg | ORAL_TABLET | Freq: Two times a day (BID) | ORAL | Status: DC

## 2013-12-19 MED ORDER — BISACODYL 5 MG PO TBEC: 10.0000 mg | DELAYED_RELEASE_TABLET | Freq: Once | ORAL | Status: DC

## 2013-12-19 MED ORDER — DOCUSATE SODIUM 100 MG PO CAPS: 100.0000 mg | ORAL_CAPSULE | Freq: Two times a day (BID) | ORAL | Status: DC

## 2013-12-19 NOTE — Care Management Note (Signed)
CARE MANAGEMENT NOTE 12/19/2013  Patient:  Dow AdolphSHAFFER,Martiza M   Account Number:  [id number]  Date Initiated:  12/17/2013  Documentation initiated by:  Vance PeperBRADY,SUSAN  Subjective/Objective Assessment:   80 yr old female s/p right femur fracture     Action/Plan:   OR today for IM Nailing.  PT/OT eval  Case manager to continue to monitor.  [facility]   Anticipated DC Date:  12/19/2013   Anticipated DC Plan:  SKILLED NURSING FACILITY  In-house referral  Clinical Social Worker      DC Planning Services  CM consult      Choice offered to / List presented to:             Status of service:  Completed, signed off Medicare Important Message given?  NA - LOS <3 / Initial given by admissions (If response is "NO", the following Medicare IM given date fields will be blank) Date Medicare IM given:   Date Additional Medicare IM given:    Discharge Disposition:  SKILLED NURSING FACILITY  Per UR Regulation:  Reviewed for med. necessity/level of care/duration of stay  If discussed at Long Length of Stay Meetings, dates discussed:    Comments:

## 2013-12-19 NOTE — Progress Notes (Signed)
NUTRITION FOLLOW-UP  DOCUMENTATION CODES Per approved criteria  -Severe malnutrition in the context of chronic illness -Underweight   INTERVENTION: Ensure Complete po BID, each supplement provides 350 kcal and 13 grams of protein  Encouraged PO intake, reviewed menu with family.   NUTRITION DIAGNOSIS: Malnutrition related to chronic illness as evidenced by severe fat and muscle wasting; ongoing.   Goal: Pt to meet >/= 90% of their estimated nutrition needs; not met.   Monitor:  PO intake, supplement acceptance, weight trend, labs   ASSESSMENT: Pt living independently at home PTA. Pt fell at home and admitted for hip fracture. S/P repair 6/15.   Pt sleeping. Granddaughter at bedside. Per family pt ate approximately 50% of Breakfast with family help.    Height: Ht Readings from Last 1 Encounters:  12/17/13 _0  (1.676 m)    Weight: Wt Readings from Last 1 Encounters:  12/17/13 96 lb (43.545 kg)   BMI:  Body mass index is 15.5 kg/(m^2).  Estimated Nutritional Needs: Kcal: 1200-1350 Protein: 55-65 grams Fluid: > 1.5 L/day  Skin: left hip incision  Diet Order: General Meal Completion: 50%   Intake/Output Summary (Last 24 hours) at 12/19/13 1016 Last data filed at 12/19/13 0950  Gross per 24 hour  Intake  252.5 ml  Output    325 ml  Net  -72.5 ml    Last BM: 6/14   Labs:   Recent Labs Lab 12/17/13 0141 12/17/13 0635  NA 142 143  K 4.0 3.7  CL 100 101  CO2 33* 30  BUN 32* 30*  CREATININE 0.89 0.74  CALCIUM 9.4 8.9  GLUCOSE 146* 139*    CBG (last 3)  No results found for this basename: GLUCAP,  in the last 72 hours  Scheduled Meds: . aspirin EC  325 mg Oral Q breakfast  . bisacodyl  10 mg Oral Once  . erythromycin   Left Eye 3 times per day  . ferrous sulfate  325 mg Oral BID WC    Continuous Infusions: . sodium chloride      Maylon Peppers RD, LDN, CNSC [phone] Pager [phone] After Hours Pager

## 2013-12-19 NOTE — Progress Notes (Signed)
[facility] PROGRESS NOTE  Briana Schultz XLK:[id number] DOB: [birth year] DOA: 12/17/2013 PCP: Pamelia Hoit, MD  Assessment/Plan: 80 y.o. female with PMH of HTN, HPL, AS with h/o syncope who lives independently had a fall at her house. Patient states she tripped and fell. Denies losing consciousness or hitting her head. Patient was brought to the ER and x-rays reveal proximal right femur fracture. On-call orthopedic surgeon Dr. Eulah Pont was consulted and patient was admitted for further workup  1. Fall mechanical -->Right hip fracture;  -Pt at high risk for surgery due to advanced age, severe AS, possible recent MI (abnormal ECG, patient had intermittent chest pains recenltly) - Dr Randon Goldsmith d/w with patient, family the bedside at length;  Pt wants to walk if possible, and accepted all  periop complications -s/p [employer] Gamma Nail with distal interlock screw on 6/15; stable cont pain control; DVT prophylaxis per ortho recommendation; likely SNF -pend PT/OT - doing well- mild pain with movement-    2. HTN- hold BB today- already holding amlodipine, diuretic due to low BP; prn hydralazine IV 3. GERD, cont PPI;  4. Acute blood loss anemia post op; no obvious s/s of bleeding; Hgb up to 7.9 after transfusion star PO iron; cont to monitor    Code Status: DNR Family Communication: d/w patient, family at the bedside  Disposition Plan: snf- Pt lives alone    Consultants:  ortho  Procedures:  ortho  Antibiotics:  none (indicate start date, and stop date if known)  HPI/Subjective: alert  Objective: Filed Vitals:   12/19/13 0758  BP: 117/39  Pulse:   Temp: 97.6 F (36.4 C)  Resp:     Intake/Output Summary (Last 24 hours) at 12/19/13 1252 Last data filed at 12/19/13 0950  Gross per 24 hour  Intake  252.5 ml  Output    325 ml  Net  -72.5 ml   Filed Weights   12/17/13 0600 12/17/13 1500  Weight: 51.2 kg (112 lb 14 oz) 43.545 kg (96 lb)    Exam:   General:   alert  Cardiovascular: s1,s2 rrr  Respiratory: CTA BL  Abdomen: soft, nt,nd   Musculoskeletal: + right thigh edema   Data Reviewed: Basic Metabolic Panel:  Recent Labs Lab 12/17/13 0141 12/17/13 0635  NA 142 143  K 4.0 3.7  CL 100 101  CO2 33* 30  GLUCOSE 146* 139*  BUN 32* 30*  CREATININE 0.89 0.74  CALCIUM 9.4 8.9   Liver Function Tests:  Recent Labs Lab 12/17/13 0635  AST 22  ALT 18  ALKPHOS 50  BILITOT 0.9  PROT 6.1  ALBUMIN 3.4*   No results found for this basename: LIPASE, AMYLASE,  in the last 168 hours No results found for this basename: AMMONIA,  in the last 168 hours CBC:  Recent Labs Lab 12/17/13 0141 12/17/13 0635 12/18/13 1825 12/19/13 1040  WBC 10.9* 11.5*  --   --   NEUTROABS 9.5* 10.0*  --   --   HGB 10.6* 9.4* 6.5* 7.9*  HCT 31.8* 28.1* 18.8* 23.8*  MCV 93.5 93.4  --   --   PLT 147* 135*  --   --    Cardiac Enzymes:  Recent Labs Lab 12/18/13 1825 12/18/13 2317 12/19/13 1040  TROPONINI <0.30 <0.30 <0.30   BNP (last 3 results) No results found for this basename: PROBNP,  in the last 8760 hours CBG: No results found for this basename: GLUCAP,  in the last 168 hours  Recent Results (from the past  240 hour(s))  URINE CULTURE     Status: None   Collection Time    12/17/13  3:17 PM      Result Value Ref Range Status   Specimen Description URINE, CATHETERIZED   Final   Special Requests NONE   Final   Culture  Setup Time     Final   Value: 12/17/2013 15:32     Performed at [facility]   Colony Count     Final   Value: 85,000 COLONIES/ML     Performed at [facility]   Culture     Final   Value: DIPHTHEROIDS(CORYNEBACTERIUM SPECIES)     Note: Standardized susceptibility testing for this organism is not available.     Performed at [facility]   Report Status 12/18/2013 FINAL   Final  SURGICAL PCR SCREEN     Status: Abnormal   Collection Time    12/17/13  3:17 PM      Result Value Ref Range  Status   MRSA, PCR POSITIVE (*) NEGATIVE Final   Staphylococcus aureus POSITIVE (*) NEGATIVE Final   Comment:            The Xpert SA Assay (FDA     approved for NASAL specimens     in patients over 78 years of age),     is one component of     a comprehensive surveillance     program.  Test performance has     been validated by [facility] for patients greater     than or equal to 78 year old.     It is not intended     to diagnose infection nor to     guide or monitor treatment.     Studies: Dg Hip Operative Right  12/17/2013   CLINICAL DATA:  Right trochanteric nail placement  EXAM: DG OPERATIVE RIGHT HIP  FLUOROSCOPY TIME:  1 min 16 seconds  COMPARISON:  None  FINDINGS: Right femoral intra medullary nail placement transfixing an oblique proximal femoral diaphysis fracture. There is mild persistent displacement. There is no failure or complication.  IMPRESSION: Right femoral intra medullary nail transfixing pain oblique proximal femoral diaphysis fracture.   Electronically Signed   By: Elige KoHetal  Patel   On: 12/17/2013 19:37   Dg Femur Right  12/17/2013   CLINICAL DATA:  Postop femoral nail insertion  EXAM: PORTABLE PELVIS 1-2 VIEWS; RIGHT FEMUR - 2 VIEW  COMPARISON:  None.  FINDINGS: Interval placement of a right femoral intramedullary nail transfixing an oblique proximal right femoral diaphysis fracture. The lesser trochanteric fragment is medially displaced. There is no dislocation. There is no hardware failure or complication.  IMPRESSION: ORIF proximal right diaphysis fracture with persistent displacement.   Electronically Signed   By: Elige KoHetal  Patel   On: 12/17/2013 21:12   Ct Head Wo Contrast  12/18/2013   CLINICAL DATA:  Change in mental status.  EXAM: CT HEAD WITHOUT CONTRAST  TECHNIQUE: Contiguous axial images were obtained from the base of the skull through the vertex without intravenous contrast.  COMPARISON:  12/17/2013  FINDINGS: There is age-appropriate cerebral atrophy.  There is no evidence of acute cortical infarct, intracranial hemorrhage, mass, midline shift, or extra-axial fluid collection. Mild periventricular white matter hypodensities are unchanged and nonspecific but compatible with mild chronic small vessel ischemic disease. Prior bilateral cataract extraction as noted. Mastoid air cells are clear. There is mild left frontal sinus mucosal thickening, improved from the  prior study.  IMPRESSION: No evidence of acute intracranial abnormality.   Electronically Signed   By: Sebastian AcheAllen  Grady   On: 12/18/2013 14:23   Dg Pelvis Portable  12/17/2013   CLINICAL DATA:  Postop femoral nail insertion  EXAM: PORTABLE PELVIS 1-2 VIEWS; RIGHT FEMUR - 2 VIEW  COMPARISON:  None.  FINDINGS: Interval placement of a right femoral intramedullary nail transfixing an oblique proximal right femoral diaphysis fracture. The lesser trochanteric fragment is medially displaced. There is no dislocation. There is no hardware failure or complication.  IMPRESSION: ORIF proximal right diaphysis fracture with persistent displacement.   Electronically Signed   By: Elige KoHetal  Patel   On: 12/17/2013 21:12    Scheduled Meds: . aspirin EC  325 mg Oral Q breakfast  . bisacodyl  10 mg Oral Once  . erythromycin   Left Eye 3 times per day  . feeding supplement (ENSURE COMPLETE)  237 mL Oral BID BM  . ferrous sulfate  325 mg Oral BID WC   Continuous Infusions: . sodium chloride      Principal Problem:   Femur fracture, right Active Problems:   HTN (hypertension)   Acute post-hemorrhagic anemia   Closed right hip fracture   Protein-calorie malnutrition, severe    Time spent: >35 minutes     [facility] Medical CenterRIZWAN,SAIMA  [facility] Pager [phone]. If 7PM-7AM, please contact night-coverage at [email or web address], password [email or web address] 12/19/2013, 12:52 PM  LOS: 2 days

## 2013-12-19 NOTE — Progress Notes (Signed)
Patient ID: Briana Schultz, female   DOB: 12/19/1912, 80 y.o.   MRN: [id number]     Subjective:  Patient reports pain as mild patient sitting up in bed and alert.  Denies any CP or SOB.  Objective:   VITALS:   Filed Vitals:   12/19/13 0402 12/19/13 0500 12/19/13 0628 12/19/13 0758  BP: 114/38 127/30 119/54 117/39  Pulse: 78 73 61   Temp: 98.6 F (37 C) 97.8 F (36.6 C) 98.1 F (36.7 C) 97.6 F (36.4 C)  TempSrc:  Axillary Oral   Resp: 14 14 16    Height:      Weight:      SpO2: 100% 100% 97%     ABD soft Sensation intact distally Dorsiflexion/Plantar flexion intact Incision: dressing C/D/I and no drainage   Lab Results  Component Value Date   WBC 11.5* 12/17/2013   HGB 6.5* 12/18/2013   HCT 18.8* 12/18/2013   MCV 93.4 12/17/2013   PLT 135* 12/17/2013     Assessment/Plan: 2 Days Post-Op   Principal Problem:   Femur fracture, right Active Problems:   HTN (hypertension)   Anemia   Closed right hip fracture   Protein-calorie malnutrition, severe   Advance diet Up with therapy ABLA Continue plan per medicine WBAT Dry dressing PRN   Haskel KhanDOUGLAS PARRY, BRANDON 12/19/2013, 8:30 AM   Margarita Ranaimothy Murphy MD [phone]

## 2013-12-19 NOTE — Discharge Summary (Addendum)
Physician Discharge Summary  Briana Schultz Hagins ZOX:[id number] DOB: 07/30/1912 DOA: 12/17/2013  PCP: Pamelia HoitWILSON,FRED HENRY, MD  Admit date: 12/17/2013 Discharge date: 12/22/2013   Time spent: >45 minutes  Recommendations for Outpatient Follow-up:  1. Going to SNF for rehab 2. Follow BP daily and resume BP meds as needed  Discharge Diagnoses:  Principal Problem:   Femur fracture, right Active Problems:   HTN - hypotensive in hospital   Acute post-hemorrhagic anemia   Protein-calorie malnutrition, severe Pulmonary edema for fluid overload   Discharge Condition: stable  Diet recommendation: regular diet  Filed Weights   12/20/13 0900 12/21/13 0649 12/22/13 0500  Weight: 47.2 kg (104 lb 0.9 oz) 48 kg (105 lb 13.1 oz) 43.545 kg (96 lb)    History of present illness:  Briana Schultz Aboud is a 80 y.o. female with history of hypertension and dementia who lives independently had a fall at her house. Patient states she tripped and fell. Denies losing consciousness or hitting her head. Patient was brought to the ER and x-rays reveal proximal right femur fracture. On-call orthopedic surgeon Dr. Eulah PontMurphy was consulted and patient was admitted for further workup. On exam patient denies any chest pain shortness of breath nausea vomiting abdominal pain diarrhea. Patient has been having some irritation of her left eye for last 3 days with no active discharge. Patient's daughter was planning to take patient to ophthalmologist for this. Patient is usually ambulatory at home with walker.  Past Medical History  Diagnosis  Date  .  Fall  secondary to weakness as of [place] OV 03/26/10  .  H/O orthostatic hypotension  .  Chest discomfort  anterior  .  Hypertension  .  Aortic valve stenosis  mild to moderate  .  Syncope and collapse  2006  .  Arthritis  .  GERD (gastroesophageal reflux disease)  .  Osteoporosis  .  Abnormal EKG  .  Scarlet fever  .  Scoliosis   Hospital Course:   Fall  mechanical -->Right hip fracture;  -Pt wants to walk if possible, and accepted all periop complications  -s/p [employer] Gamma Nail with distal interlock screw on 6/15; stable cont pain control - ortho has prescribed full dose ASA for DVT prophylaxis - gets confused with Vicodin therefore switched to Ultram PRN for the hip pain - already on Meloxicam BID for arthritis - will d/c to SNF   Acute blood loss anemia post op; no obvious s/s of bleeding - transfused 2 U PRBC on 6/16 for Hgb drop to 6.5- pta had Hgb 10-11 on admission -  - Hgb today 8.2 - started PO iron- prevent constipation with Colace daily  Pulmonary edema - likely from fluid overload from blood transfusions and cont IVF (had poor oral intake and therefore IVF were given) - pulm edema occurring on 6/18 quite suddenly- noted to have crackles with hypoxia (pulse ox in 80s)- no dyspnea noted - started diuretics and diurese for 2 days-  Neg balance by 5400 ml- pulse ox now 93% at rest on room air and no crackles on exam   H/o HTN but currently normotensive - BP was quite low a few days ago possibly due to the acute blood loss therefore, have been holding BB, amlodipine and Valsartan/ HCTZ    GERD -cont PPI   Insomnia - cannot sleep without her Xanax at night- please continue it  Dementia - quite confused about place and why she is here   Procedures:  6/15- right Gamma Nail -  Dr Eulah Pont Study Conclusions  - Left ventricle: Technically difficult study. The cavity size was normal. Wall thickness was increased in a pattern of mild LVH. The estimated ejection fraction was 60%. Regional wall motion abnormalities cannot be excluded. - Aortic valve: Sclerosis without stenosis. There was mild regurgitation. - Left atrium: The atrium was mildly dilated. - Pulmonary arteries: PA peak pressure: 65 mm Hg (S).   Consultations:  ortho  Discharge Exam: Filed Vitals:   12/22/13 0500  BP: 129/48  Pulse: 101  Temp: 97.7 F  (36.5 C)  Resp: 16    General: Awake and alert-  Cardiovascular: RRR no murmurs Respiratory: mild crackles at base- pulse ox > 93% on rm air- clear lungs Abd: soft, NT, ND, BS+ Ext: no edema of lower legs (edema in right thigh)  Discharge Instructions You were cared for by a hospitalist during your hospital stay. If you have any questions about your discharge medications or the care you received while you were in the hospital after you are discharged, you can call the unit and asked to speak with the hospitalist on call if the hospitalist that took care of you is not available. Once you are discharged, your primary care physician will handle any further medical issues. Please note that NO REFILLS for any discharge medications will be authorized once you are discharged, as it is imperative that you return to your primary care physician (or establish a relationship with a primary care physician if you do not have one) for your aftercare needs so that they can reassess your need for medications and monitor your lab values.      Discharge Instructions   Discharge instructions    Complete by:  As directed   Check BP daily and resume BP meds as needed     Incentive spirometry RT    Complete by:  As directed      Weight bearing as tolerated    Complete by:  As directed             Medication List    STOP taking these medications       amLODipine 10 MG tablet  Commonly known as:  NORVASC     carvedilol 3.125 MG tablet  Commonly known as:  COREG     valsartan-hydrochlorothiazide 160-25 MG per tablet  Commonly known as:  DIOVAN-HCT      TAKE these medications       ALPRAZolam 0.5 MG tablet  Commonly known as:  XANAX  Take 1 tablet (0.5 mg total) by mouth at bedtime. insomnia     aspirin 325 MG EC tablet  Take 1 tablet (325 mg total) by mouth daily with breakfast.     calcium carbonate 600 MG Tabs tablet  Commonly known as:  OS-CAL  Take 1,200 mg by mouth daily with  breakfast.     cholecalciferol 1000 UNITS tablet  Commonly known as:  VITAMIN D  Take 5,000 Units by mouth once a week. On Fridays. Confirmed by family member     docusate sodium 100 MG capsule  Commonly known as:  COLACE  Take 1 capsule (100 mg total) by mouth 2 (two) times daily. Continue this while taking narcotics to help with bowel movements     esomeprazole 40 MG capsule  Commonly known as:  NEXIUM  Take 40 mg by mouth 2 (two) times daily before a meal.     feeding supplement (GLUCERNA SHAKE) Liqd  Take 237 mLs by mouth daily.  ferrous sulfate 325 (65 FE) MG tablet  Take 1 tablet (325 mg total) by mouth 2 (two) times daily with a meal.     halobetasol 0.05 % cream  Commonly known as:  ULTRAVATE  Apply 1 application topically 2 (two) times daily as needed (rash).     meloxicam 7.5 MG tablet  Commonly known as:  MOBIC  Take 7.5 mg by mouth 2 (two) times daily.     multivitamin with minerals Tabs tablet  Take 1 tablet by mouth daily.     SARNA ULTRA EX  Apply 1 application topically daily as needed (itching).     sennosides-docusate sodium 8.6-50 MG tablet  Commonly known as:  SENOKOT-S  Take 1 tablet by mouth daily as needed for constipation.     silver sulfADIAZINE 1 % cream  Commonly known as:  SILVADENE  Apply 1 application topically daily as needed (rash).     traMADol 50 MG tablet  Commonly known as:  ULTRAM  Take 1-2 tablets (50-100 mg total) by mouth every 6 (six) hours as needed for moderate pain or severe pain.       No Known Allergies Follow-up Information   Follow up with MURPHY, TIMOTHY, D, MD In 2 weeks.   Specialty:  Orthopedic Surgery   Contact information:   [address] [place] [address] [phone]        The results of significant diagnostics from this hospitalization (including imaging, microbiology, ancillary and laboratory) are listed below for reference.    Significant Diagnostic Studies: Dg Hip  Operative Right  12/17/2013   CLINICAL DATA:  Right trochanteric nail placement  EXAM: DG OPERATIVE RIGHT HIP  FLUOROSCOPY TIME:  1 min 16 seconds  COMPARISON:  None  FINDINGS: Right femoral intra medullary nail placement transfixing an oblique proximal femoral diaphysis fracture. There is mild persistent displacement. There is no failure or complication.  IMPRESSION: Right femoral intra medullary nail transfixing pain oblique proximal femoral diaphysis fracture.   Electronically Signed   By: Elige Ko   On: 12/17/2013 19:37   Dg Femur Right  12/17/2013   CLINICAL DATA:  Postop femoral nail insertion  EXAM: PORTABLE PELVIS 1-2 VIEWS; RIGHT FEMUR - 2 VIEW  COMPARISON:  None.  FINDINGS: Interval placement of a right femoral intramedullary nail transfixing an oblique proximal right femoral diaphysis fracture. The lesser trochanteric fragment is medially displaced. There is no dislocation. There is no hardware failure or complication.  IMPRESSION: ORIF proximal right diaphysis fracture with persistent displacement.   Electronically Signed   By: Elige Ko   On: 12/17/2013 21:12   Dg Tibia/fibula Right  12/17/2013   CLINICAL DATA:  Status post fall from standing; right leg pain.  EXAM: RIGHT TIBIA AND FIBULA - 2 VIEW  COMPARISON:  Right knee radiographs performed 12/11/2008  FINDINGS: The tibia and fibula appear intact. Visualized joint spaces are preserved, though the knee is incompletely assessed on this study. The ankle mortise is incompletely assessed, but appears grossly unremarkable. No significant soft tissue abnormalities are characterized on radiograph. Scattered vascular calcifications are seen.  IMPRESSION: No evidence of fracture or dislocation.   Electronically Signed   By: Roanna Raider Schultz.D.   On: 12/17/2013 02:30   Ct Head Wo Contrast  12/18/2013   CLINICAL DATA:  Change in mental status.  EXAM: CT HEAD WITHOUT CONTRAST  TECHNIQUE: Contiguous axial images were obtained from the base of the  skull through the vertex without intravenous contrast.  COMPARISON:  12/17/2013  FINDINGS: There is age-appropriate cerebral atrophy. There is no evidence of acute cortical infarct, intracranial hemorrhage, mass, midline shift, or extra-axial fluid collection. Mild periventricular white matter hypodensities are unchanged and nonspecific but compatible with mild chronic small vessel ischemic disease. Prior bilateral cataract extraction as noted. Mastoid air cells are clear. There is mild left frontal sinus mucosal thickening, improved from the prior study.  IMPRESSION: No evidence of acute intracranial abnormality.   Electronically Signed   By: Sebastian AcheAllen  Grady   On: 12/18/2013 14:23   Ct Head Wo Contrast  12/17/2013   CLINICAL DATA:  Fall, femur fracture.  EXAM: CT HEAD WITHOUT CONTRAST  TECHNIQUE: Contiguous axial images were obtained from the base of the skull through the vertex without intravenous contrast.  COMPARISON:  None.  FINDINGS: The ventricles and sulci are normal for age. No intraparenchymal hemorrhage, mass effect nor midline shift. Patchy supratentorial white matter hypodensities are less than expected for patient's age and though non-specific suggest sequelae of chronic small vessel ischemic disease. No acute large vascular territory infarcts.  No abnormal extra-axial fluid collections. Basal cisterns are patent. Moderate calcific atherosclerosis of the carotid siphons.  No skull fracture. The included ocular globes and orbital contents are non-suspicious. Status post apparent bilateral ocular lens implants. Left frontal sinus mucosal thickening without paranasal sinus air-fluid levels. The mastoid air cells are well aerated. 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  IMPRESSION: No acute intracranial process.  Involutional changes. Mild to moderate white matter changes are less than expected for age may reflect chronic small vessel ischemic disease.   Electronically Signed   By: Awilda Metroourtnay   Bloomer   On: 12/17/2013 05:03   Dg Pelvis Portable  12/17/2013   CLINICAL DATA:  Postop femoral nail insertion  EXAM: PORTABLE PELVIS 1-2 VIEWS; RIGHT FEMUR - 2 VIEW  COMPARISON:  None.  FINDINGS: Interval placement of a right femoral intramedullary nail transfixing an oblique proximal right femoral diaphysis fracture. The lesser trochanteric fragment is medially displaced. There is no dislocation. There is no hardware failure or complication.  IMPRESSION: ORIF proximal right diaphysis fracture with persistent displacement.   Electronically Signed   By: Elige KoHetal  Patel   On: 12/17/2013 21:12   Dg Pelvis Portable  12/17/2013   CLINICAL DATA:  Status post fall; right leg pain and deformity.  EXAM: PORTABLE PELVIS 1-2 VIEWS  COMPARISON:  Pelvis radiograph performed 02/08/2012  FINDINGS: There is a mildly comminuted fracture involving the right proximal femoral diaphysis, with a displaced relatively large lesser trochanteric fragment. 3-4 cm of shortening is noted. No additional fractures are seen. The femoral heads remain seated in their respective acetabula.  The sacroiliac joints are unremarkable in appearance. No additional fractures are seen. The visualized bowel gas pattern is grossly unremarkable.  IMPRESSION: Mildly comminuted fracture involving the right proximal femoral diaphysis, with a displaced relatively large lesser trochanteric fragment. 3-4 cm of shortening noted at the fracture site.   Electronically Signed   By: Roanna RaiderJeffery  Chang Schultz.D.   On: 12/17/2013 02:23   Dg Chest Portable 1 View  12/17/2013   CLINICAL DATA:  Status post fall.  Preoperative chest radiograph.  EXAM: PORTABLE CHEST - 1 VIEW  COMPARISON:  Chest radiograph performed 03/24/2012  FINDINGS: The lungs are well-aerated and clear. There is no evidence of focal opacification, pleural effusion or pneumothorax.  The cardiomediastinal silhouette is mildly enlarged. No acute osseous abnormalities are seen.  IMPRESSION: No acute  cardiopulmonary process seen; mild cardiomegaly noted. No displaced rib fractures identified.  Electronically Signed   By: Roanna Raider Schultz.D.   On: 12/17/2013 02:29   Dg Femur Right Port  12/17/2013   CLINICAL DATA:  Status post fall; right femoral deformity.  EXAM: PORTABLE RIGHT FEMUR - 2 VIEW  COMPARISON:  Right knee radiographs performed 12/11/2008  FINDINGS: There is a displaced mildly comminuted fracture involving the right proximal femoral diaphysis, with a relatively large lesser trochanteric fragment. Shortening is noted at the fracture site, with external rotation of the right leg.  No additional fractures are seen. Diffuse vascular calcifications noted. Mild degenerative change at the right knee is better characterized on the current knee radiographs.  IMPRESSION: Displaced mildly comminuted fracture involving the right proximal femoral diaphysis, with a relatively large lesser trochanteric fragment. Shortening noted at the fracture site, with external rotation of the right leg.   Electronically Signed   By: Roanna Raider Schultz.D.   On: 12/17/2013 02:28   Dg Knee Right Port  12/17/2013   CLINICAL DATA:  Status post fall from standing. Right leg deformity and pain.  EXAM: PORTABLE RIGHT KNEE - 1-2 VIEW  COMPARISON:  Right knee radiographs performed 12/11/2008 Next  FINDINGS: There is no evidence of fracture or dislocation. There is narrowing of the lateral compartment, worsened from the prior study. Moderate osteophytes are noted arising at the lateral patellofemoral compartments.  Trace joint fluid remains within normal limits. Scattered vascular calcifications are seen.  IMPRESSION: 1. No evidence of fracture or dislocation. 2. Mildly worsened degenerative osteoarthritis at the right knee. 3. Scattered vascular calcifications seen.   Electronically Signed   By: Roanna Raider Schultz.D.   On: 12/17/2013 02:26    Microbiology: Recent Results (from the past 240 hour(s))  URINE CULTURE     Status: None    Collection Time    12/17/13  3:17 PM      Result Value Ref Range Status   Specimen Description URINE, CATHETERIZED   Final   Special Requests NONE   Final   Culture  Setup Time     Final   Value: 12/17/2013 15:32     Performed at [facility] Count     Final   Value: 85,000 COLONIES/ML     Performed at [facility]   Culture     Final   Value: DIPHTHEROIDS(CORYNEBACTERIUM SPECIES)     Note: Standardized susceptibility testing for this organism is not available.     Performed at [facility]   Report Status 12/18/2013 FINAL   Final  SURGICAL PCR SCREEN     Status: Abnormal   Collection Time    12/17/13  3:17 PM      Result Value Ref Range Status   MRSA, PCR POSITIVE (*) NEGATIVE Final   Staphylococcus aureus POSITIVE (*) NEGATIVE Final   Comment:            The Xpert SA Assay (FDA     approved for NASAL specimens     in patients over 52 years of age),     is one component of     a comprehensive surveillance     program.  Test performance has     been validated by [facility] for patients greater     than or equal to 10 year old.     It is not intended     to diagnose infection nor to     guide or monitor treatment.  URINE CULTURE  Status: None   Collection Time    12/20/13 10:01 AM      Result Value Ref Range Status   Specimen Description URINE, CATHETERIZED   Final   Special Requests Normal   Final   Culture  Setup Time     Final   Value: 12/20/2013 10:40     Performed at [facility] Count     Final   Value: NO GROWTH     Performed at [facility]   Culture     Final   Value: NO GROWTH     Performed at [facility]   Report Status 12/21/2013 FINAL   Final     Labs: Basic Metabolic Panel:  Recent Labs Lab 12/17/13 0141 12/17/13 0635 12/20/13 0550 12/21/13 0920 12/22/13 0500  NA 142 143 145 141 140  K 4.0 3.7 3.6* 4.2 4.6  CL 100 101 107 98 93*  CO2 33* 30 29 33* 30  GLUCOSE  146* 139* 110* 210* 157*  BUN 32* 30* 31* 22 27*  CREATININE 0.89 0.74 0.71 0.63 0.76  CALCIUM 9.4 8.9 8.0* 8.3* 9.1  MG  --   --   --  1.7  --    Liver Function Tests:  Recent Labs Lab 12/17/13 0635  AST 22  ALT 18  ALKPHOS 50  BILITOT 0.9  PROT 6.1  ALBUMIN 3.4*   No results found for this basename: LIPASE, AMYLASE,  in the last 168 hours No results found for this basename: AMMONIA,  in the last 168 hours CBC:  Recent Labs Lab 12/17/13 0141 12/17/13 0635 12/18/13 1825 12/19/13 1040 12/20/13 0550 12/20/13 1113 12/21/13 0920  WBC 10.9* 11.5*  --   --  7.8  --  10.1  NEUTROABS 9.5* 10.0*  --   --   --   --   --   HGB 10.6* 9.4* 6.5* 7.9* 7.1* 7.8* 8.2*  HCT 31.8* 28.1* 18.8* 23.8* 21.4* 23.1* 24.5*  MCV 93.5 93.4  --   --  94.7  --  93.9  PLT 147* 135*  --   --  98*  --  156   Cardiac Enzymes:  Recent Labs Lab 12/18/13 1825 12/18/13 2317 12/19/13 1040 12/20/13 2045  TROPONINI <0.30 <0.30 <0.30 <0.30   BNP: BNP (last 3 results) No results found for this basename: PROBNP,  in the last 8760 hours CBG: No results found for this basename: GLUCAP,  in the last 168 hours     Signed:  Calvert Cantor, MD  [facility] 12/22/2013, 10:08 AM

## 2013-12-19 NOTE — Progress Notes (Addendum)
Continue to follow for PT/OT evaluation, which is required [facility]. Needed ASAP. RNCM paged PT/OT this morning.   Maryclare LabradorJulie Anderson, MSW, [facility] Regional Medical CenterCSWA Clinical Social Worker [phone]

## 2013-12-19 NOTE — Evaluation (Signed)
Physical Therapy Evaluation Patient Details Name: Dow AdolphHallie M Pettinato MRN: [id number] DOB: 04/30/1913 Today's Date: 12/19/2013   History of Present Illness  pt is a 80 yo female s/p IM femoral following right femur fracture  Clinical Impression  Pt adm from home due to the above. Presents with a significant decrease in functional mobility secondary to deficits indicated below. Pt to benefit from skilled acute PT to address deficits indicated below and maximize functional mobility prior to D/C. Pt will require SNF for post acute rehab upon acute D/C. Pt is motivated to walk again and has great family support at bedside.    Follow Up Recommendations SNF;Supervision/Assistance - 24 hour    Equipment Recommendations  None recommended by PT    Recommendations for Other Services OT consult     Precautions / Restrictions Precautions Precautions: Fall Precaution Comments: encouraged knee flexion and encourged WBAT  Restrictions Weight Bearing Restrictions: Yes RLE Weight Bearing: Weight bearing as tolerated      Mobility  Bed Mobility Overal bed mobility: Needs Assistance;+2 for physical assistance Bed Mobility: Supine to Sit;Sit to Supine     Supine to sit: +2 for physical assistance;HOB elevated;Mod assist Sit to supine: +2 for physical assistance;Mod assist   General bed mobility comments: pt able to (A) with bed mobility minimally; was able to advance Lt LE and use UEs on handrails; max cues for hand placement and sequencing; (A) to bring trunk to sitting position and advance rt LE to/off EOB  Transfers Overall transfer level: Needs assistance Equipment used: 2 person hand held assist Transfers: Sit to/from Stand Sit to Stand: +2 physical assistance;Total assist         General transfer comment: attempted sit to stand and was unable to achieve due to increased pain with WB through Rt LE   Ambulation/Gait             General Gait Details: unable to attempt due to incr  pain with activity  Stairs            Wheelchair Mobility    Modified Rankin (Stroke Patients Only)       Balance Overall balance assessment: History of Falls;Needs assistance Sitting-balance support: Feet supported;Single extremity supported Sitting balance-Leahy Scale: Poor Sitting balance - Comments: requires UE support and has posterior/Lt LE lean due to pain; pt tolerated sitting EOB 8 min  Postural control: Posterior lean;Left lateral lean Standing balance support: During functional activity;Bilateral upper extremity supported Standing balance-Leahy Scale: Zero Standing balance comment: unable to achieve standing with 2 person total (A) and                              Pertinent Vitals/Pain 10/10 with activity; 0/10 at rest. patient repositioned for comfort     Home Living Family/patient expects to be discharged to:: Skilled nursing facility Living Arrangements: Alone               Additional Comments: Pt was living alone PTA    Prior Function Level of Independence: Independent with assistive device(s)         Comments: PTA pt ambulating with RW     Hand Dominance   Dominant Hand: Right    Extremity/Trunk Assessment   Upper Extremity Assessment: Defer to OT evaluation           Lower Extremity Assessment: RLE deficits/detail RLE Deficits / Details: ankle 2/5 quad 2+/5 in sitting; hip unable to assess due to pain  Cervical / Trunk Assessment: Kyphotic;Other exceptions  Communication   Communication: HOH  Cognition Arousal/Alertness: Awake/alert Behavior During Therapy: Flat affect Overall Cognitive Status: Impaired/Different from baseline Area of Impairment: Memory;Problem solving     Memory: Decreased recall of precautions Following Commands: Follows one step commands inconsistently Safety/Judgement: Decreased awareness of safety;Decreased awareness of deficits   Problem Solving: Slow processing;Decreased  initiation;Difficulty sequencing;Requires verbal cues;Requires tactile cues      General Comments General comments (skin integrity, edema, etc.): discussed D/C recommendations and goals of therapy with pt and family    Exercises Low Level/ICU Exercises Ankle Circles/Pumps: AROM;Both;10 reps;Supine      Assessment/Plan    PT Assessment Patient needs continued PT services  PT Diagnosis Difficulty walking;Generalized weakness;Acute pain   PT Problem List Decreased strength;Decreased range of motion;Decreased activity tolerance;Decreased balance;Decreased mobility;Decreased cognition;Decreased knowledge of use of DME;Decreased knowledge of precautions;Pain  PT Treatment Interventions DME instruction;Gait training;Functional mobility training;Therapeutic activities;Therapeutic exercise;Balance training;Neuromuscular re-education;Patient/family education;Wheelchair mobility training   PT Goals (Current goals can be found in the Care Plan section) Acute Rehab PT Goals Patient Stated Goal: to walk again PT Goal Formulation: With patient/family Time For Goal Achievement: 12/26/13 Potential to Achieve Goals: Fair    Frequency Min 3X/week   Barriers to discharge Decreased caregiver support pt lives alone    Co-evaluation               End of Session Equipment Utilized During Treatment: Gait belt Activity Tolerance: Patient limited by pain Patient left: in bed;with call bell/phone within reach;with family/visitor present Nurse Communication: Mobility status;Precautions;Weight bearing status         Time: [id number] PT Time Calculation (min): 25 min   Charges:   PT Evaluation $Initial PT Evaluation Tier I: 1 Procedure PT Treatments $Therapeutic Activity: 8-22 mins   PT G CodesDonell Sievert:          Ruthanne Mcneish N, [place]PT  [phone] 12/19/2013, 4:37 PM

## 2013-12-19 NOTE — Evaluation (Signed)
Occupational Therapy Evaluation Patient Details Name: Dow AdolphHallie M Donaway MRN: [id number] DOB: 05/29/1913 Today's Date: 12/19/2013    History of Present Illness pt is a 80 yo female s/p IM femoral following right femur fracture   Clinical Impression   Pt admitted with the above diagnoses and seen for acute OT eval. PTA pt lived alone and ambulated with rw. Pt currently needing +2 physical A for ADLs. Recommending SNF at d/c. Pt's daughters would like pt to be more medically stable prior to d/c to SNF.    Follow Up Recommendations  SNF    Equipment Recommendations  Other (comment) (defer to next venue)    Recommendations for Other Services       Precautions / Restrictions Precautions Precautions: Posterior Hip;Fall Precaution Comments: reviewed WBAT status with pt and daughters Restrictions Weight Bearing Restrictions: Yes RLE Weight Bearing: Weight bearing as tolerated      Mobility Bed Mobility                  Transfers                      Balance                                            ADL Overall ADL's : Needs assistance/impaired Eating/Feeding: Set up;Bed level   Grooming: Bed level;Min guard   Upper Body Bathing: Bed level;Moderate assistance   Lower Body Bathing: +2 for physical assistance;Sitting/lateral leans;Bed level   Upper Body Dressing : Moderate assistance;Bed level   Lower Body Dressing: +2 for physical assistance;Sitting/lateral leans;Bed level     Toilet Transfer Details (indicate cue type and reason): bed level Toileting- Clothing Manipulation and Hygiene: +2 for physical assistance;Bed level     Tub/Shower Transfer Details (indicate cue type and reason): bed level Functional mobility during ADLs: +2 for physical assistance General ADL Comments: Pt needing +2 physical A for ADLs at bed level.      Vision                     Perception     Praxis      Pertinent Vitals/Pain lethargic      Hand Dominance     Extremity/Trunk Assessment Upper Extremity Assessment Upper Extremity Assessment: Generalized weakness   Lower Extremity Assessment Lower Extremity Assessment: Defer to PT evaluation       Communication Communication Communication: No difficulties   Cognition Arousal/Alertness: Lethargic Behavior During Therapy: Flat affect Overall Cognitive Status: Impaired/Different from baseline Area of Impairment: Safety/judgement;Following commands;Memory;Attention     Memory: Decreased short-term memory;Decreased recall of precautions Following Commands: Follows one step commands inconsistently Safety/Judgement: Decreased awareness of safety;Decreased awareness of deficits         General Comments       Exercises       Shoulder Instructions      Home Living Family/patient expects to be discharged to:: Skilled nursing facility Living Arrangements: Alone                                      Prior Functioning/Environment Level of Independence: Independent with assistive device(s)             OT Diagnosis: Generalized weakness;Acute pain   OT Problem List: Decreased strength;Decreased  range of motion;Decreased activity tolerance;Impaired balance (sitting and/or standing);Decreased cognition;Decreased safety awareness;Decreased knowledge of use of DME or AE;Decreased knowledge of precautions;Pain   OT Treatment/Interventions:      OT Goals(Current goals can be found in the care plan section) Acute Rehab OT Goals Patient Stated Goal: not stated. family wants pt to stabilize more prior to d/c to SNF  OT Frequency:     Barriers to D/C:            Co-evaluation              End of Session    Activity Tolerance: Patient limited by lethargy Patient left: in bed;with call bell/phone within reach;with family/visitor present   Time: [id number] OT Time Calculation (min): 9 min Charges:  OT General Charges $OT Visit: 1  Procedure OT Evaluation $Initial OT Evaluation Tier I: 1 Procedure G-Codes:    Pilar GrammesMathews, Kathryn H 12/19/2013, 3:26 PM

## 2013-12-20 ENCOUNTER — Inpatient Hospital Stay (HOSPITAL_COMMUNITY): Payer: Medicare Other

## 2013-12-20 DIAGNOSIS — I359 Nonrheumatic aortic valve disorder, unspecified: Secondary | ICD-10-CM

## 2013-12-20 DIAGNOSIS — J810 Acute pulmonary edema: Secondary | ICD-10-CM | POA: Diagnosis not present

## 2013-12-20 LAB — URINALYSIS, ROUTINE W REFLEX MICROSCOPIC
BILIRUBIN URINE: NEGATIVE
GLUCOSE, UA: NEGATIVE mg/dL
Ketones, ur: NEGATIVE mg/dL
Leukocytes, UA: NEGATIVE
Nitrite: NEGATIVE
Protein, ur: NEGATIVE mg/dL
SPECIFIC GRAVITY, URINE: 1.02 (ref 1.005–1.030)
Urobilinogen, UA: 0.2 mg/dL (ref 0.0–1.0)
pH: 5 (ref 5.0–8.0)

## 2013-12-20 LAB — URINE MICROSCOPIC-ADD ON

## 2013-12-20 LAB — BASIC METABOLIC PANEL
BUN: 31 mg/dL — AB (ref 6–23)
CHLORIDE: 107 meq/L (ref 96–112)
CO2: 29 mEq/L (ref 19–32)
Calcium: 8 mg/dL — ABNORMAL LOW (ref 8.4–10.5)
Creatinine, Ser: 0.71 mg/dL (ref 0.50–1.10)
GFR calc Af Amer: 81 mL/min — ABNORMAL LOW (ref >90)
GFR calc non Af Amer: 69 mL/min — ABNORMAL LOW (ref >90)
Glucose, Bld: 110 mg/dL — ABNORMAL HIGH (ref 70–99)
POTASSIUM: 3.6 meq/L — AB (ref 3.7–5.3)
Sodium: 145 mEq/L (ref 137–147)

## 2013-12-20 LAB — CBC
HCT: 21.4 % — ABNORMAL LOW (ref 36.0–46.0)
HEMOGLOBIN: 7.1 g/dL — AB (ref 12.0–15.0)
MCH: 31.4 pg (ref 26.0–34.0)
MCHC: 33.2 g/dL (ref 30.0–36.0)
MCV: 94.7 fL (ref 78.0–100.0)
Platelets: 98 10*3/uL — ABNORMAL LOW (ref 150–400)
RBC: 2.26 MIL/uL — ABNORMAL LOW (ref 3.87–5.11)
RDW: 14.8 % (ref 11.5–15.5)
WBC: 7.8 10*3/uL (ref 4.0–10.5)

## 2013-12-20 LAB — TROPONIN I: Troponin I: <0.3 ng/mL (ref <0.30)

## 2013-12-20 LAB — HEMOGLOBIN AND HEMATOCRIT, BLOOD
HEMATOCRIT: 23.1 % — AB (ref 36.0–46.0)
Hemoglobin: 7.8 g/dL — ABNORMAL LOW (ref 12.0–15.0)

## 2013-12-20 MED ORDER — FUROSEMIDE 10 MG/ML IJ SOLN
20.0000 mg | Freq: Three times a day (TID) | INTRAMUSCULAR | Status: DC
  Administered 2013-12-20 – 2013-12-22 (×6): 20 mg via INTRAVENOUS
  Filled 2013-12-20 (×10): qty 2

## 2013-12-20 MED ORDER — POTASSIUM CHLORIDE CRYS ER 20 MEQ PO TBCR
40.0000 meq | EXTENDED_RELEASE_TABLET | Freq: Two times a day (BID) | ORAL | Status: DC
  Administered 2013-12-20 – 2013-12-22 (×5): 40 meq via ORAL
  Filled 2013-12-20 (×8): qty 2

## 2013-12-20 MED ORDER — TRAMADOL HCL 50 MG PO TABS
50.0000 mg | ORAL_TABLET | Freq: Two times a day (BID) | ORAL | Status: DC | PRN
  Administered 2013-12-20: 50 mg via ORAL
  Filled 2013-12-20: qty 1

## 2013-12-20 MED ORDER — MUPIROCIN 2 % EX OINT
TOPICAL_OINTMENT | Freq: Two times a day (BID) | CUTANEOUS | Status: DC
  Administered 2013-12-20 – 2013-12-22 (×5): via NASAL
  Filled 2013-12-20 (×3): qty 22

## 2013-12-20 MED ORDER — FUROSEMIDE 10 MG/ML IJ SOLN
20.0000 mg | Freq: Three times a day (TID) | INTRAMUSCULAR | Status: DC
  Administered 2013-12-20: 20 mg via INTRAVENOUS
  Filled 2013-12-20 (×2): qty 2

## 2013-12-20 MED ORDER — TRAMADOL HCL 50 MG PO TABS: 50.0000 mg | ORAL_TABLET | Freq: Four times a day (QID) | ORAL | Status: DC | PRN

## 2013-12-20 NOTE — Progress Notes (Signed)
RN paged secondary to pt having chest pain. When questioned, pt says it has been on and off pretty much all day but she "didn't want to bother anyone". RN says she points to both sides of her chest when asked about location of pain. Does not radiate. VSS. Pt is 80 yo and is a DNR. Family doesn't want aggressive measures. Pt had echo today with 60% EF but showed pulmonary edema and was placed on Lasix. She is on ASA 325 qd.  To measure where we are, will do a 12 lead and get a troponin. Should either of these be abnormal, will speak to family. I highly doubt this lady would be a candidate for catheterization.  Will follow.  Jimmye NormanKaren Kirby-Graham, NP [facility]

## 2013-12-20 NOTE — Progress Notes (Signed)
I participated in the care of this patient and agree with the above history, physical and evaluation. I performed a review of the history and a physical exam as detailed   Timothy Daniel Murphy MD  

## 2013-12-20 NOTE — Progress Notes (Signed)
I participated in the care of this patient and agree with the above history, physical and evaluation. I performed a review of the history and a physical exam as detailed   Briana Schultz Briana Amilia Vandenbrink MD  

## 2013-12-20 NOTE — Progress Notes (Addendum)
[facility] PT[facility]OT [facility]s [facility], as this is pt/family first choice. [facility], and pt does have a bed at this facility. CSW left voicemail for [facility] asking facility call Lovette ClicheDonna Crowder, covering CSW today and let her know if facility can take pt. Covering CSW to discharge pt to SNF today.  Addendum: This CSW made phone call to pt's granddaughter Luster LandsbergRenee to inform her [facility] but [facility] said they do not have a bed for pt. [facility] to [facility] to do paperwork tomorrow at 1:30pm. Covering CSW informing [employer] that [facility]. Covering CSW has insurance auth information. Pt can discharge when medically ready. This CSW signing off.  Maryclare LabradorJulie Anderson, MSW, LCSWA Clinical Social Worker

## 2013-12-20 NOTE — Progress Notes (Signed)
Patient ID: Briana Schultz, female   DOB: 06/21/1913, 80 y.o.   MRN: [id number]     Subjective:  Patient reports pain as mild to moderate.  Patient complains of pain in the knee more that in the hip.  Patient denies any CP or SOB  Objective:   VITALS:   Filed Vitals:   12/19/13 0628 12/19/13 0758 12/19/13 2213 12/20/13 0639  BP: 119/54 117/39 114/48 161/48  Pulse: 61  61 61  Temp: 98.1 F (36.7 C) 97.6 F (36.4 C) 98.3 F (36.8 C) 98.4 F (36.9 C)  TempSrc: Oral  Oral Oral  Resp: 16  16 16   Height:      Weight:      SpO2: 97%  95% 96%    ABD soft Sensation intact distally Dorsiflexion/Plantar flexion intact Incision: dressing C/D/I and no drainage Knee has pain with ROM 0-70 Painful to palpation.  Stable to varus/ valgus stress   Lab Results  Component Value Date   WBC 7.8 12/20/2013   HGB 7.1* 12/20/2013   HCT 21.4* 12/20/2013   MCV 94.7 12/20/2013   PLT PENDING 12/20/2013     Assessment/Plan: 3 Days Post-Op   Principal Problem:   Femur fracture, right Active Problems:   HTN (hypertension)   Acute post-hemorrhagic anemia   Closed right hip fracture   Protein-calorie malnutrition, severe   Advance diet Up with therapy Continue plan per Medicine WBAT ABLA continue to monitor   Haskel KhanDOUGLAS PARRY, BRANDON 12/20/2013, 7:22 AM   Margarita Ranaimothy Murphy MD [phone]

## 2013-12-20 NOTE — Progress Notes (Signed)
  Echocardiogram 2D Echocardiogram has been performed.  CHUNG, KWONG 12/20/2013, 3:05 PM

## 2013-12-20 NOTE — Progress Notes (Signed)
[facility] PROGRESS NOTE  Briana Schultz ZOX:[id number] DOB: [birth year] DOA: 12/17/2013 PCP: Pamelia Hoit, MD  Assessment/Plan: 80 y.o. female with PMH of HTN, HPL, AS with h/o syncope who lives independently had a fall at her house. Patient states she tripped and fell. Denies losing consciousness or hitting her head. Patient was brought to the ER and x-rays reveal proximal right femur fracture. On-call orthopedic surgeon Dr. Eulah Pont was consulted and patient was admitted for further workup   Fall mechanical -->Right hip fracture;  -Pt at high risk for surgery due to advanced age, severe AS, possible recent MI (abnormal ECG, patient had intermittent chest pains recenltly) - Dr Randon Goldsmith d/w with patient, family the bedside at length;  Pt wants to walk if possible, and accepted all  periop complications -s/p [employer] Gamma Nail with distal interlock screw on 6/15; stable cont pain control; DVT prophylaxis per ortho recommendation; likely SNF -pend PT/OT - doing well- mild pain with movement- switch Vicodin to Tramadol as daughter states is causes confusion  Hypoxia/ acute resp failure - pulse ox in 80s on room air- crackles in b/l bases- likely fluid overloaded - has been receiving IVF and received blood- no h/o CHF- will stop IVF, start Lasix, closely follow I and O and daily weights and obtain an Echo  Anemia - due- due to acute blood loss - given 2 U PRBC but Hgb only improved by 1 g- suspect she is currently hemodiluted- will follow Hgb but not transfuse yet.   hypotension with HTN - starting 6/17- holding BB today - already were holding amlodipine and Diovan    GERD - cont PPI    Code Status: DNR Family Communication: d/w patient, family at the bedside  Disposition Plan: snf for rehab- Pt lives alone    Consultants:  ortho  Procedures:  6/15 - right hip GAmma nai  Antibiotics:  none (indicate start date, and stop date if  known)  HPI/Subjective: alert  Objective: Filed Vitals:   12/20/13 0639  BP: 161/48  Pulse: 61  Temp: 98.4 F (36.9 C)  Resp: 16    Intake/Output Summary (Last 24 hours) at 12/20/13 0910 Last data filed at 12/20/13 0214  Gross per 24 hour  Intake    240 ml  Output    725 ml  Net   -485 ml   Filed Weights   12/17/13 0600 12/17/13 1500  Weight: 51.2 kg (112 lb 14 oz) 43.545 kg (96 lb)    Exam:   General:  alert  Cardiovascular: s1,s2 rrr  Respiratory: crackles at bases b/l   Abdomen: soft, nt,nd   Musculoskeletal: + right thigh edema   Data Reviewed: Basic Metabolic Panel:  Recent Labs Lab 12/17/13 0141 12/17/13 0635 12/20/13 0550  NA 142 143 145  K 4.0 3.7 3.6*  CL 100 101 107  CO2 33* 30 29  GLUCOSE 146* 139* 110*  BUN 32* 30* 31*  CREATININE 0.89 0.74 0.71  CALCIUM 9.4 8.9 8.0*   Liver Function Tests:  Recent Labs Lab 12/17/13 0635  AST 22  ALT 18  ALKPHOS 50  BILITOT 0.9  PROT 6.1  ALBUMIN 3.4*   No results found for this basename: LIPASE, AMYLASE,  in the last 168 hours No results found for this basename: AMMONIA,  in the last 168 hours CBC:  Recent Labs Lab 12/17/13 0141 12/17/13 0635 12/18/13 1825 12/19/13 1040 12/20/13 0550  WBC 10.9* 11.5*  --   --  7.8  NEUTROABS 9.5* 10.0*  --   --   --  HGB 10.6* 9.4* 6.5* 7.9* 7.1*  HCT 31.8* 28.1* 18.8* 23.8* 21.4*  MCV 93.5 93.4  --   --  94.7  PLT 147* 135*  --   --  98*   Cardiac Enzymes:  Recent Labs Lab 12/18/13 1825 12/18/13 2317 12/19/13 1040  TROPONINI <0.30 <0.30 <0.30   BNP (last 3 results) No results found for this basename: PROBNP,  in the last 8760 hours CBG: No results found for this basename: GLUCAP,  in the last 168 hours  Recent Results (from the past 240 hour(s))  URINE CULTURE     Status: None   Collection Time    12/17/13  3:17 PM      Result Value Ref Range Status   Specimen Description URINE, CATHETERIZED   Final   Special Requests NONE    Final   Culture  Setup Time     Final   Value: 12/17/2013 15:32     Performed at [facility]   Colony Count     Final   Value: 85,000 COLONIES/ML     Performed at [facility]   Culture     Final   Value: DIPHTHEROIDS(CORYNEBACTERIUM SPECIES)     Note: Standardized susceptibility testing for this organism is not available.     Performed at [facility]   Report Status 12/18/2013 FINAL   Final  SURGICAL PCR SCREEN     Status: Abnormal   Collection Time    12/17/13  3:17 PM      Result Value Ref Range Status   MRSA, PCR POSITIVE (*) NEGATIVE Final   Staphylococcus aureus POSITIVE (*) NEGATIVE Final   Comment:            The Xpert SA Assay (FDA     approved for NASAL specimens     in patients over 78 years of age),     is one component of     a comprehensive surveillance     program.  Test performance has     been validated by [facility] for patients greater     than or equal to 78 year old.     It is not intended     to diagnose infection nor to     guide or monitor treatment.     Studies: Ct Head Wo Contrast  12/18/2013   CLINICAL DATA:  Change in mental status.  EXAM: CT HEAD WITHOUT CONTRAST  TECHNIQUE: Contiguous axial images were obtained from the base of the skull through the vertex without intravenous contrast.  COMPARISON:  12/17/2013  FINDINGS: There is age-appropriate cerebral atrophy. There is no evidence of acute cortical infarct, intracranial hemorrhage, mass, midline shift, or extra-axial fluid collection. Mild periventricular white matter hypodensities are unchanged and nonspecific but compatible with mild chronic small vessel ischemic disease. Prior bilateral cataract extraction as noted. Mastoid air cells are clear. There is mild left frontal sinus mucosal thickening, improved from the prior study.  IMPRESSION: No evidence of acute intracranial abnormality.   Electronically Signed   By: Sebastian AcheAllen  Grady   On: 12/18/2013 14:23    Scheduled  Meds: . aspirin EC  325 mg Oral Q breakfast  . bisacodyl  10 mg Oral Once  . feeding supplement (ENSURE COMPLETE)  237 mL Oral BID BM  . ferrous sulfate  325 mg Oral BID WC  . furosemide  20 mg Intravenous 3 times per day  . potassium chloride  40 mEq Oral BID  Time spent: >35 minutes     [facility] Surgery[facility]ZWAN,Kaelen Caughlin  [facility] Pager [phone]. If 7PM-7AM, please contact night-coverage at [email or web address], password [email or web address] 12/20/2013, 9:10 AM  LOS: 3 days

## 2013-12-21 DIAGNOSIS — R9431 Abnormal electrocardiogram [ECG] [EKG]: Secondary | ICD-10-CM | POA: Diagnosis present

## 2013-12-21 DIAGNOSIS — S8290XS Unspecified fracture of unspecified lower leg, sequela: Secondary | ICD-10-CM

## 2013-12-21 LAB — BASIC METABOLIC PANEL
BUN: 22 mg/dL (ref 6–23)
CO2: 33 mEq/L — ABNORMAL HIGH (ref 19–32)
Calcium: 8.3 mg/dL — ABNORMAL LOW (ref 8.4–10.5)
Chloride: 98 mEq/L (ref 96–112)
Creatinine, Ser: 0.63 mg/dL (ref 0.50–1.10)
GFR, EST AFRICAN AMERICAN: 84 mL/min — AB (ref >90)
GFR, EST NON AFRICAN AMERICAN: 72 mL/min — AB (ref >90)
Glucose, Bld: 210 mg/dL — ABNORMAL HIGH (ref 70–99)
Potassium: 4.2 mEq/L (ref 3.7–5.3)
SODIUM: 141 meq/L (ref 137–147)

## 2013-12-21 LAB — URINE CULTURE
COLONY COUNT: NO GROWTH
Culture: NO GROWTH
Special Requests: NORMAL

## 2013-12-21 LAB — CBC
HCT: 24.5 % — ABNORMAL LOW (ref 36.0–46.0)
Hemoglobin: 8.2 g/dL — ABNORMAL LOW (ref 12.0–15.0)
MCH: 31.4 pg (ref 26.0–34.0)
MCHC: 33.5 g/dL (ref 30.0–36.0)
MCV: 93.9 fL (ref 78.0–100.0)
PLATELETS: 156 10*3/uL (ref 150–400)
RBC: 2.61 MIL/uL — ABNORMAL LOW (ref 3.87–5.11)
RDW: 14.2 % (ref 11.5–15.5)
WBC: 10.1 10*3/uL (ref 4.0–10.5)

## 2013-12-21 LAB — MAGNESIUM: MAGNESIUM: 1.7 mg/dL (ref 1.5–2.5)

## 2013-12-21 MED ORDER — ONDANSETRON HCL 4 MG/2ML IJ SOLN: 2.0000 mg | Freq: Three times a day (TID) | INTRAMUSCULAR | Status: DC | PRN

## 2013-12-21 MED ORDER — TRAMADOL HCL 50 MG PO TABS
50.0000 mg | ORAL_TABLET | Freq: Four times a day (QID) | ORAL | Status: DC | PRN
  Administered 2013-12-21 – 2013-12-22 (×3): 50 mg via ORAL
  Filled 2013-12-21 (×3): qty 1

## 2013-12-21 MED ORDER — ONDANSETRON HCL 4 MG PO TABS
2.0000 mg | ORAL_TABLET | Freq: Three times a day (TID) | ORAL | Status: DC | PRN
  Administered 2013-12-21: 2 mg via ORAL
  Filled 2013-12-21: qty 1

## 2013-12-21 NOTE — Progress Notes (Signed)
I participated in the care of this patient and agree with the above history, physical and evaluation. I performed a review of the history and a physical exam as detailed   Timothy Daniel Murphy MD  

## 2013-12-21 NOTE — Clinical Social Work Placement (Addendum)
    Clinical Social Work Department CLINICAL SOCIAL WORK PLACEMENT NOTE 12/22/2013  Patient:  Briana Schultz,Briana Schultz  Account Number:  [id number] Admit date:  12/17/2013  Clinical Social Worker:  clare LabradorJULIE ANDERSON, Theresia MajorsLCSWA  Date/time:  12/18/2013 02:03 PM  Clinical Social Work is seeking post-discharge placement for this patient at the following level of care:   SKILLED NURSING   (*CSW will update this form in Epic as items are completed)     Patient/family provided with [facility] Department of Clinical Social Work's list of facilities offering this level of care within the geographic area requested by the patient (or if unable, by the patient's family).  12/18/2013  Patient/family informed of their freedom to choose among providers that offer the needed level of care, that participate in Medicare, Medicaid or managed care program needed by the patient, have an available bed and are willing to accept the patient.    Patient/family informed of [facility]' ownership interest in [facility], as well as of the fact that they are under no obligation to receive care at this facility.  PASARR submitted to EDS on 12/18/2013 PASARR number received on 12/18/2013  FL2 transmitted to all facilities in geographic area requested by pt/family on  12/18/2013 FL2 transmitted to all facilities within larger geographic area on   Patient informed that his/her managed care company has contracts with or will negotiate with  certain facilities, including the following:   [employer]- Auth for SNF recieved 12/20/13 for d/c 12/20/13     Patient/family informed of bed offers received:  12/20/2013 [facility] Physician recommends and patient chooses bed at    [facility] on  12/22/2013 Patient to be transferred to facility by Ambulance  [facility]) Patient and family notified of transfer on 12/22/2013 Name of family member notified:   Briana Schultz   Daughter  The following physician request were entered in Epic: Physician Request  Please prepare priority discharge summary and prescriptions.  Please prepare priority discharge summary, including medications  Please sign FL2.    Additional Comments: 12/22/13 OK per MD for d/c today to SNF.  Patient and family agree [facility] and are pleased with choice. Nursing notified to call report. Patient/family denied any concerns or questions. [employer] authorication is in place.  CSW provided support and will sign off.

## 2013-12-21 NOTE — Progress Notes (Signed)
Patient ID: Briana AdolphHallie M Schultz, female   DOB: 09/17/1912, 80 y.o.   MRN: [id number]     Subjective:  Patient reports pain as mild to moderate.  Patient having more pain in the knee.  Objective:   VITALS:   Filed Vitals:   12/20/13 2050 12/20/13 2356 12/21/13 0400 12/21/13 0649  BP: 142/62   164/59  Pulse: 92   94  Temp: 97.6 F (36.4 C)   97.4 F (36.3 C)  TempSrc: Oral   Oral  Resp: 18 16 16 16   Height:      Weight:    48 kg (105 lb 13.1 oz)  SpO2: 98%   97%    ABD soft Sensation intact distally Dorsiflexion/Plantar flexion intact Incision: dressing C/D/I and no drainage Xray of the right knee negative for fracture Severe osteoarthritis right knee Painful with motion and palpation.   Lab Results  Component Value Date   WBC 7.8 12/20/2013   HGB 7.8* 12/20/2013   HCT 23.1* 12/20/2013   MCV 94.7 12/20/2013   PLT 98* 12/20/2013     Assessment/Plan: 4 Days Post-Op   Principal Problem:   Femur fracture, right Active Problems:   HTN (hypertension)   Acute post-hemorrhagic anemia   Closed right hip fracture   Protein-calorie malnutrition, severe   Acute pulmonary edema   Advance diet Up with therapy Continue plan per medicine. WBAT Right lower ext. ABLA continue to monitor  Haskel KhanDOUGLAS PARRY, BRANDON 12/21/2013, 7:57 AM   Margarita Ranaimothy Murphy MD [phone]

## 2013-12-21 NOTE — Progress Notes (Signed)
Plan d/c today to [facility] if medically stable per MD.  [facility]; family to sign papers at facility at 1:30 pm today. Lorri Frederickonna T. West PughCrowder, LCSWA  [phone]

## 2013-12-21 NOTE — Progress Notes (Signed)
Physical Therapy Treatment Patient Details Name: Briana Schultz MRN: [id number] DOB: 05/11/1913 Today's Date: 12/21/2013    History of Present Illness pt is a 80 yo female s/p IM femoral following right femur fracture    PT Comments    Patient continues to be limited by cognition and pain. Had to be redirected several times to where she was. Daughter present throughout. They are aWaiting SNF placement at this time  Follow Up Recommendations  SNF;Supervision/Assistance - 24 hour     Equipment Recommendations  None recommended by PT    Recommendations for Other Services       Precautions / Restrictions Precautions Precautions: Fall Restrictions RLE Weight Bearing: Weight bearing as tolerated    Mobility  Bed Mobility Overal bed mobility: Needs Assistance;+2 for physical assistance Bed Mobility: Supine to Sit     Supine to sit: +2 for physical assistance;HOB elevated;Mod assist     General bed mobility comments: pt able to (A) with bed mobility minimally; was able to advance Lt LE and use UEs on handrails; max cues for hand placement and sequencing; (A) to bring trunk to sitting position and advance rt LE to/off EOB  Transfers Overall transfer level: Needs assistance Equipment used: 2 person hand held assist Transfers: Squat Pivot Transfers Sit to Stand: +2 physical assistance;Total assist   Squat pivot transfers: +2 physical assistance;Total assist     General transfer comment: Patient unable to put much weight through R LE. Use of chuck pad to transfer patient to recliner  Ambulation/Gait                 Stairs            Wheelchair Mobility    Modified Rankin (Stroke Patients Only)       Balance     Sitting balance-Leahy Scale: Poor       Standing balance-Leahy Scale: Zero                      Cognition Arousal/Alertness: Awake/alert Behavior During Therapy: WFL for tasks assessed/performed Overall Cognitive Status:  Impaired/Different from baseline Area of Impairment: Memory;Problem solving     Memory: Decreased recall of precautions;Decreased short-term memory Following Commands: Follows one step commands inconsistently Safety/Judgement: Decreased awareness of safety;Decreased awareness of deficits   Problem Solving: Slow processing;Decreased initiation;Difficulty sequencing;Requires verbal cues;Requires tactile cues      Exercises      General Comments        Pertinent Vitals/Pain Complained of pain but did not rate    Home Living                      Prior Function            PT Goals (current goals can now be found in the care plan section) Progress towards PT goals: Progressing toward goals    Frequency  Min 3X/week    PT Plan Current plan remains appropriate    Co-evaluation             End of Session Equipment Utilized During Treatment: Gait belt Activity Tolerance: Patient limited by pain Patient left: with family/visitor present;with call bell/phone within reach;in chair     Time: 1030-1046 PT Time Calculation (min): 16 min  Charges:  $Therapeutic Activity: 8-22 mins                    G Codes:      Fredrich BirksRobinette, Julia Elizabeth 12/21/2013,  1:00 PM 12/21/2013 Fredrich BirksRobinette, Julia Elizabeth PTA [phone] pager [phone] office

## 2013-12-21 NOTE — Progress Notes (Signed)
[facility] PROGRESS NOTE  Briana AdolphHallie M Schultz WUJ:[id number] DOB: 11/15/1912 DOA: 12/17/2013 PCP: Pamelia HoitWILSON,FRED HENRY, MD  Assessment/Plan: 80 y.o. female with PMH of HTN, HPL, AS with h/o syncope who lives independently had a fall at her house. Patient states she tripped and fell. Denies losing consciousness or hitting her head. Patient was brought to the ER and x-rays reveal proximal right femur fracture. On-call orthopedic surgeon Dr. Eulah PontMurphy was consulted and patient was admitted for further workup  HPI/Subjective: Alert- she feels great- daughter is very anxious today- upset that she has right hip pain every time she moves and thinks she is not breathing well.    Fall mechanical -->Right hip fracture;  -Pt at high risk for surgery due to advanced age, severe AS, possible recent MI (abnormal ECG, patient had intermittent chest pains recenltly) - Dr Randon GoldsmithUlegbek d/w with patient, family the bedside at length;  Pt wants to walk if possible, and accepted all  periop complications -s/p [employer] Gamma Nail with distal interlock screw on 6/15; stable cont pain control; DVT prophylaxis per ortho recommendation; likely SNF -pend PT/OT - doing well- mild pain with movement- switch Vicodin to Tramadol as daughter states is causes confusion  Hypoxia/ acute resp failure - 6/18 pulse ox in 80s on room air- crackles in b/l bases on- likely fluid overloaded - has been receiving IVF and received blood- no h/o CHF - is diuresing well with lasix- Pulse ox 91% on RA today at rest - ECHO performed- no CHF - some pulm HTN noted  Anemia -  due to acute blood loss - given 2 U PRBC but Hgb only improved by 1 g- suspect she was hemodiluted- Hgb appears to have come up today after diuresis    hypotension with HTN - starting 6/17- holding BB today - already were holding amlodipine and Diovan    GERD - cont PPI    Code Status: DNR Family Communication: d/w patient, family at the bedside  Disposition Plan: snf  for rehab- Pt lives alone    Consultants:  ortho  Procedures:  6/15 - right hip GAmma nail  ECHO 6/18  Study Conclusions  - Left ventricle: Technically difficult study. The cavity size was normal. Wall thickness was increased in a pattern of mild LVH. The estimated ejection fraction was 60%. Regional wall motion abnormalities cannot be excluded. - Aortic valve: Sclerosis without stenosis. There was mild regurgitation. - Left atrium: The atrium was mildly dilated. - Pulmonary arteries: PA peak pressure: 65 mm Hg (S).   Antibiotics:  none (indicate start date, and stop date if known)   Objective: Filed Vitals:   12/21/13 1200  BP:   Pulse:   Temp:   Resp: 18    Intake/Output Summary (Last 24 hours) at 12/21/13 1534 Last data filed at 12/21/13 0730  Gross per 24 hour  Intake    420 ml  Output   3100 ml  Net  -2680 ml   Filed Weights   12/17/13 1500 12/20/13 0900 12/21/13 0649  Weight: 43.545 kg (96 lb) 47.2 kg (104 lb 0.9 oz) 48 kg (105 lb 13.1 oz)    Exam:   General:  alert  Cardiovascular: s1,s2 rrr  Respiratory: crackles at bases b/l but improved from yesterday- normal respirations  Abdomen: soft, nt,nd   Musculoskeletal: + right thigh edema   Data Reviewed: Basic Metabolic Panel:  Recent Labs Lab 12/17/13 0141 12/17/13 0635 12/20/13 0550 12/21/13 0920  NA 142 143 145 141  K 4.0 3.7 3.6*  4.2  CL 100 101 107 98  CO2 33* 30 29 33*  GLUCOSE 146* 139* 110* 210*  BUN 32* 30* 31* 22  CREATININE 0.89 0.74 0.71 0.63  CALCIUM 9.4 8.9 8.0* 8.3*  MG  --   --   --  1.7   Liver Function Tests:  Recent Labs Lab 12/17/13 0635  AST 22  ALT 18  ALKPHOS 50  BILITOT 0.9  PROT 6.1  ALBUMIN 3.4*   No results found for this basename: LIPASE, AMYLASE,  in the last 168 hours No results found for this basename: AMMONIA,  in the last 168 hours CBC:  Recent Labs Lab 12/17/13 0141 12/17/13 0635 12/18/13 1825 12/19/13 1040 12/20/13 0550  12/20/13 1113 12/21/13 0920  WBC 10.9* 11.5*  --   --  7.8  --  10.1  NEUTROABS 9.5* 10.0*  --   --   --   --   --   HGB 10.6* 9.4* 6.5* 7.9* 7.1* 7.8* 8.2*  HCT 31.8* 28.1* 18.8* 23.8* 21.4* 23.1* 24.5*  MCV 93.5 93.4  --   --  94.7  --  93.9  PLT 147* 135*  --   --  98*  --  156   Cardiac Enzymes:  Recent Labs Lab 12/18/13 1825 12/18/13 2317 12/19/13 1040 12/20/13 2045  TROPONINI <0.30 <0.30 <0.30 <0.30   BNP (last 3 results) No results found for this basename: PROBNP,  in the last 8760 hours CBG: No results found for this basename: GLUCAP,  in the last 168 hours  Recent Results (from the past 240 hour(s))  URINE CULTURE     Status: None   Collection Time    12/17/13  3:17 PM      Result Value Ref Range Status   Specimen Description URINE, CATHETERIZED   Final   Special Requests NONE   Final   Culture  Setup Time     Final   Value: 12/17/2013 15:32     Performed at [facility]   Colony Count     Final   Value: 85,000 COLONIES/ML     Performed at [facility]   Culture     Final   Value: DIPHTHEROIDS(CORYNEBACTERIUM SPECIES)     Note: Standardized susceptibility testing for this organism is not available.     Performed at [facility]   Report Status 12/18/2013 FINAL   Final  SURGICAL PCR SCREEN     Status: Abnormal   Collection Time    12/17/13  3:17 PM      Result Value Ref Range Status   MRSA, PCR POSITIVE (*) NEGATIVE Final   Staphylococcus aureus POSITIVE (*) NEGATIVE Final   Comment:            The Xpert SA Assay (FDA     approved for NASAL specimens     in patients over 78 years of age),     is one component of     a comprehensive surveillance     program.  Test performance has     been validated by [facility] for patients greater     than or equal to 42 year old.     It is not intended     to diagnose infection nor to     guide or monitor treatment.  URINE CULTURE     Status: None   Collection Time    12/20/13  10:01 AM      Result Value  Ref Range Status   Specimen Description URINE, CATHETERIZED   Final   Special Requests Normal   Final   Culture  Setup Time     Final   Value: 12/20/2013 10:40     Performed at [facility] Count     Final   Value: NO GROWTH     Performed at [facility]   Culture     Final   Value: NO GROWTH     Performed at [facility]   Report Status 12/21/2013 FINAL   Final     Studies: Dg Knee Complete 4 Views Right  12/20/2013   CLINICAL DATA:  Pain  EXAM: RIGHT KNEE - COMPLETE 4+ VIEW  COMPARISON:  December 17, 2013  FINDINGS: Frontal, lateral, and bilateral oblique views were obtained. There is now evidence of rod and screw fixation in the distal femur region. There is no acute fracture or dislocation in the knee region. No joint effusion. Fairly marked narrowing laterally is again noted. There is milder narrowing in the Joint spaces. There is spurring in all compartments, primarily laterally. No erosive change.  IMPRESSION: Osteoarthritic change, most marked laterally. No fracture or effusion. Postoperative change in the visualized distal femur.   Electronically Signed   By: Bretta Bang M.D.   On: 12/20/2013 13:15    Scheduled Meds: . aspirin EC  325 mg Oral Q breakfast  . bisacodyl  10 mg Oral Once  . feeding supplement (ENSURE COMPLETE)  237 mL Oral BID BM  . ferrous sulfate  325 mg Oral BID WC  . furosemide  20 mg Intravenous TID  . mupirocin ointment   Nasal BID  . potassium chloride  40 mEq Oral BID      Time spent: >35 minutes     [facility]  [facility] Pager [phone]. If 7PM-7AM, please contact night-coverage at [email or web address], password [facility] 12/21/2013, 3:34 PM  LOS: 4 days

## 2013-12-22 LAB — BASIC METABOLIC PANEL
BUN: 27 mg/dL — ABNORMAL HIGH (ref 6–23)
CALCIUM: 9.1 mg/dL (ref 8.4–10.5)
CO2: 30 mEq/L (ref 19–32)
Chloride: 93 mEq/L — ABNORMAL LOW (ref 96–112)
Creatinine, Ser: 0.76 mg/dL (ref 0.50–1.10)
GFR calc non Af Amer: 68 mL/min — ABNORMAL LOW (ref >90)
GFR, EST AFRICAN AMERICAN: 79 mL/min — AB (ref >90)
GLUCOSE: 157 mg/dL — AB (ref 70–99)
Potassium: 4.6 mEq/L (ref 3.7–5.3)
SODIUM: 140 meq/L (ref 137–147)

## 2013-12-22 MED ORDER — TRAMADOL HCL 50 MG PO TABS: 50.0000 mg | ORAL_TABLET | Freq: Four times a day (QID) | ORAL | Status: DC | PRN

## 2013-12-22 MED ORDER — ALPRAZOLAM 0.5 MG PO TABS: 0.5000 mg | ORAL_TABLET | Freq: Every day | ORAL | Status: DC

## 2013-12-22 NOTE — Procedures (Signed)
12/22/2013  12:37 PM  PATIENT:  Briana Schultz    PRE-PROCEDURE DIAGNOSIS:  OA R knee  POST-OPERATIVE DIAGNOSIS:  Same  PROCEDURE:  Intra-articular injection right knee  PROCEDURE DETAILS:  After informed verbal consent was obtained the superolateral portal was prepped with alcohol and the knee was injected with 4 ml of Marcaine and 1 ml of Depo-Medrol (40mg ) was injected. She tolerated this well and a Band-Aid was placed.

## 2013-12-22 NOTE — Progress Notes (Signed)
Subjective:  Patient is sleeping today, did not sleep well last night per family  Objective:   VITALS:   Filed Vitals:   12/21/13 1500 12/21/13 1600 12/21/13 2004 12/22/13 0500  BP: 130/57  136/49 129/48  Pulse: 108  94 101  Temp: 97.9 F (36.6 C)  97.6 F (36.4 C) 97.7 F (36.5 C)  TempSrc:   Oral Oral  Resp: 16 18 18 16   Height:      Weight:    43.545 kg (96 lb)  SpO2: 92%  90% 96%    Physical Exam  Dressing: C/D/I  Compartments soft   LABS  Results for orders placed during the hospital encounter of 12/17/13 (from the past 24 hour(s))  CBC     Status: Abnormal   Collection Time    12/21/13  9:20 AM      Result Value Ref Range   WBC 10.1  4.0 - 10.5 K/uL   RBC 2.61 (*) 3.87 - 5.11 MIL/uL   Hemoglobin 8.2 (*) 12.0 - 15.0 g/dL   HCT 78.224.5 (*) 95.636.0 - 21.346.0 %   MCV 93.9  78.0 - 100.0 fL   MCH 31.4  26.0 - 34.0 pg   MCHC 33.5  30.0 - 36.0 g/dL   RDW 08.614.2  57.811.5 - 46.915.5 %   Platelets 156  150 - 400 K/uL  BASIC METABOLIC PANEL     Status: Abnormal   Collection Time    12/21/13  9:20 AM      Result Value Ref Range   Sodium 141  137 - 147 mEq/L   Potassium 4.2  3.7 - 5.3 mEq/L   Chloride 98  96 - 112 mEq/L   CO2 33 (*) 19 - 32 mEq/L   Glucose, Bld 210 (*) 70 - 99 mg/dL   BUN 22  6 - 23 mg/dL   Creatinine, Ser 6.290.63  0.50 - 1.10 mg/dL   Calcium 8.3 (*) 8.4 - 10.5 mg/dL   GFR calc non Af Amer 72 (*) >90 mL/min   GFR calc Af Amer 84 (*) >90 mL/min  MAGNESIUM     Status: None   Collection Time    12/21/13  9:20 AM      Result Value Ref Range   Magnesium 1.7  1.5 - 2.5 mg/dL  BASIC METABOLIC PANEL     Status: Abnormal   Collection Time    12/22/13  5:00 AM      Result Value Ref Range   Sodium 140  137 - 147 mEq/L   Potassium 4.6  3.7 - 5.3 mEq/L   Chloride 93 (*) 96 - 112 mEq/L   CO2 30  19 - 32 mEq/L   Glucose, Bld 157 (*) 70 - 99 mg/dL   BUN 27 (*) 6 - 23 mg/dL   Creatinine, Ser 5.280.76  0.50 - 1.10 mg/dL   Calcium 9.1  8.4 - 41.310.5 mg/dL   GFR calc non Af  Amer 68 (*) >90 mL/min   GFR calc Af Amer 79 (*) >90 mL/min     Assessment/Plan: 5 Days Post-Op   Principal Problem:   Femur fracture, right Active Problems:   HTN (hypertension)   Acute post-hemorrhagic anemia   Closed right hip fracture   Protein-calorie malnutrition, severe   Acute pulmonary edema   Prolonged QT interval   PLAN: Weight Bearing: WBAT Dressings: Keep C/D/I, may change QOD starting Monday per nursing VTE prophylaxis: Ambulation, SCD's, ASA 325 daily Dispo: ok for d/c from orthopedic  standpoint to SNF  I will perform cortisone injection to the right knee later today   Margarita RanaMURPHY, TIMOTHY, D 12/22/2013, 9:00 AM   Margarita Ranaimothy Murphy, MD Cell [phone]

## 2013-12-25 ENCOUNTER — Inpatient Hospital Stay
Admission: RE | Admit: 2013-12-25 | Discharge: 2014-01-02 | Disposition: A | Discharge status: Nursing Home (Includes ICF) | Payer: Medicare Other | Source: Ambulatory Visit | Attending: Internal Medicine | Admitting: Internal Medicine

## 2013-12-26 ENCOUNTER — Other Ambulatory Visit: Payer: Self-pay | Admitting: *Deleted

## 2013-12-26 MED ORDER — ALPRAZOLAM 0.5 MG PO TABS: ORAL_TABLET | ORAL | Status: DC

## 2013-12-26 MED ORDER — TRAMADOL HCL 50 MG PO TABS: ORAL_TABLET | ORAL | Status: DC

## 2013-12-26 NOTE — Telephone Encounter (Signed)
[facility] 

## 2013-12-27 ENCOUNTER — Non-Acute Institutional Stay (SKILLED_NURSING_FACILITY): Payer: Medicare Other | Admitting: Internal Medicine

## 2013-12-27 ENCOUNTER — Inpatient Hospital Stay (HOSPITAL_COMMUNITY)
Admit: 2013-12-27 | Discharge: 2013-12-27 | Disposition: A | Discharge status: Home / Self Care | Payer: Medicare Other | Attending: Internal Medicine | Admitting: Internal Medicine

## 2013-12-27 DIAGNOSIS — R0789 Other chest pain: Secondary | ICD-10-CM

## 2013-12-27 DIAGNOSIS — S8290XS Unspecified fracture of unspecified lower leg, sequela: Secondary | ICD-10-CM

## 2013-12-27 DIAGNOSIS — D649 Anemia, unspecified: Secondary | ICD-10-CM

## 2013-12-27 DIAGNOSIS — R0781 Pleurodynia: Secondary | ICD-10-CM

## 2013-12-27 DIAGNOSIS — J810 Acute pulmonary edema: Secondary | ICD-10-CM

## 2013-12-27 DIAGNOSIS — R079 Chest pain, unspecified: Secondary | ICD-10-CM

## 2013-12-27 DIAGNOSIS — K219 Gastro-esophageal reflux disease without esophagitis: Secondary | ICD-10-CM

## 2013-12-27 DIAGNOSIS — S7291XS Unspecified fracture of right femur, sequela: Secondary | ICD-10-CM

## 2013-12-27 DIAGNOSIS — I10 Essential (primary) hypertension: Secondary | ICD-10-CM

## 2013-12-27 NOTE — Progress Notes (Signed)
Patient ID: Briana Schultz, female   DOB: 10/02/1912, 80 y.o.   MRN: [id number]   This is an acute visit.  Oral care skilled.  [facility]  Chief complaint-acute visit status post hospitalization for right hip fracture with repair. History of present illness.  Patient is a very pleasant 80 year old female with a history of hypertension and mild dementia who had lived independently but had a fall in her home and sustained a right hip fracture.  Her fracture was surgically repaired with an IM nailing in interlocked screw-he is on aspirin for DVT prophylaxis.  She got confused with Vicodin pain management and this was switched to tramadol when necessary she appears to be tolerating this well she is also on Mobic twice a day for arthritis.  Postop she did have anemia and required transfusion of 2 units hemoglobin dropped initially to 6.5 on discharge was 8.2.  She is on iron.   she also had pulmonary edema in IV fluids-she was diuresed for 2 days and apparently this resolved unremarkably.--This was thought possibly to be caused by the blood transfusion or IV fluids  Her blood pressure was low in the hospital thought possibly to be blood loss-her beta blocker Norvasc and valsartan hydrochlorothiazide  Patient does have some mild dementia apparently had some confusion in the hospital on exam today she was oriented to day of week and with prompting could name the president he dementia appears to be fairly mild.  Previous medical history.  History of femur fracture status post repair.  Hypertension with hypotension in the hospital.  Anemia thought to be blood loss from surgery.  Protein calorie malnutrition.  Pulmonary edema from fluid overload.  Aortic valve stenosis.  Arthritis.  GERD.  Osteoporosis.  Apparently previous history of scarlet fever.  History of scoliosis.  Social history-again patient did live independently she is a widow that she has a previous smoking  history but quit years ago denies any alcohol use.  She did work in a cot.  Family history-father with a history of MI mother with a history of goiter brother with a history of MI sister has colon cancer.  Medications.  Aspirin 325 mg a day.  Calcium carbonate 600 mg 2 tablets with breakfast.  Multivitamin daily.  Vitamin D 1000 units daily.  Zinc 50 mg x2 weeks.  Vitamin C 50 mg daily for 28 days.  Xanax 0.5 mg each bedtime.   Nexium 40 mg twice a day.  Tylenol 50 mg every 6 hours when necessary.  Colace 100 mg twice a day.  Ferrous sulfate 325 mg twice a day  Mobic 7.5 mg twice a day  Review of systems.  General no complaints of fever or chills.  Skin does not complaining of any rash or itching.  Head ears eyes nose mouth and throat does not complaining of visual changes she does have prescription lenses does not complaining of nasal discharge  Respiratory-does not complaining of shortness of breath but does state that at times she will have some discomfort in the lower rib area.  Cardiac is not complaining of chest pain has not really had significant edema.  GI does not complaining of any abdominal discomfort nausea or vomiting or constipation.  GU does not complain of dysuria.  Muscle skeletal again does complain of some lower rib discomfort-does not specifically complain of hip pain at this point pain appears controlled.  Neurologic does not complaining of a headache dizziness or syncopal-type feelings.  Psych-does have a history of  mild dementia.  Physical exam.  Temperature is 97.7 pulse 79 respirations 20 blood pressure 147/61-117/65-in this range weight is 94.2 pounds.  In general this is a frail elderly female in no distress sitting comfortably in a wheelchair.  Her skin is warm and dry surgical site right hip Steri-Strips are in place there is no sign of infection this appears to be healing unremarkably.  Eyes she has prescription lenses pupils  appear reactive to light visual acuity appears grossly intact.  Oropharynx is clear mucous membranes moist.  Chest is clear to auscultation with somewhat shallow air entry there is no labored breathing.  Heart is regular rate and rhythm without murmur gallop or rub she has positive pedal pulses bilaterally there is not really significant lower extremity edema.  Abdomen is soft somewhat protuberant nontender with positive bowel sounds.  Muscle skeletal possibly some mild tenderness to palpation lower rib area I do not note any deformity here--  Neurologic-is grossly intact her speech is clear the lateralizing findings.  Psych she is oriented to self day of week somewhat confused to month didt know the year--- with prompting was able to tell me the president.  Labs.  12/26/2013.  WBC 8.3 hemoglobin 9.7 platelets 256.  Sodium 142 potassium 4.4 BUN 27 creatinine 0.69.  He  12/22/2013.  Sodium 140 potassium 4.6 BUN 27 creatinine 0.76.  12/17/2013.  Liver function tests within normal limits except albumin of 3.4.    Assessment and plan.  #1 history of right hip fracture with repair-patient appears to be doing reasonably well with this she is on aspirin for anticoagulation is working with therapy- pain appears to be controlled with the tramadol she's also Mobic for arthritis continue to monitor.  #2-history of postop anemia-this appears to be trending up per chart review her hemoglobin was in the eights on discharge it has risen to over 9 she is on iron.  #3  history of possible lower rib pain Will obtain a chest as well as upper abdominal x-ray  #4- history of pulmonary edema this was thought secondary to transfusion or IV fluids in the hospit-clinically she appears stable I do not see significant edema continue to monitor there are no complaints of shortness of breath.  #5-history of hypertension again she has numerous medications held secondary to low blood pressures in the  hospital he does not appear her blood pressures here are consistently elevated most recently 147/61-117/65-120/68-at this point we'll continue to monitor.  #6-history of GERD she continues on PPI cannot rule out possibly this lower rib discomfort may be GERD related Will continue to monitor for now clinically she appears stable certainly not in any distress.  #7 dementia-at this point this appears to be mild/moderate at this point will monitor.   I suspect her returning to a independent setting may be a challenge-I suspect she will need a BP mental status exam.  #8-history of osteoporosis she is on calcium supplementation.  #9-history of arthritis-this appears stable currently on Mobic again he is receiving Ultram as well when necessary.  [id number]-of note greater than 40 minutes spent assessing patient-discussing her concerns at bedside along with family that was present-and coordinating and formulating a plan of care for numerous diagnoses-of note greater than 50% of time spent coordinating plan of care

## 2013-12-29 ENCOUNTER — Encounter: Payer: Self-pay | Admitting: Internal Medicine

## 2013-12-30 ENCOUNTER — Inpatient Hospital Stay (HOSPITAL_COMMUNITY)
Admit: 2013-12-30 | Discharge: 2013-12-30 | Disposition: A | Discharge status: Home / Self Care | Payer: Medicare Other | Attending: Internal Medicine | Admitting: Internal Medicine

## 2013-12-31 ENCOUNTER — Non-Acute Institutional Stay (SKILLED_NURSING_FACILITY): Payer: Medicare Other | Admitting: Internal Medicine

## 2013-12-31 DIAGNOSIS — R0789 Other chest pain: Secondary | ICD-10-CM

## 2013-12-31 DIAGNOSIS — S7291XS Unspecified fracture of right femur, sequela: Secondary | ICD-10-CM

## 2013-12-31 DIAGNOSIS — J810 Acute pulmonary edema: Secondary | ICD-10-CM

## 2013-12-31 DIAGNOSIS — K219 Gastro-esophageal reflux disease without esophagitis: Secondary | ICD-10-CM

## 2013-12-31 DIAGNOSIS — S8290XS Unspecified fracture of unspecified lower leg, sequela: Secondary | ICD-10-CM

## 2014-01-01 ENCOUNTER — Non-Acute Institutional Stay (SKILLED_NURSING_FACILITY): Payer: Medicare Other | Admitting: Internal Medicine

## 2014-01-01 ENCOUNTER — Encounter: Payer: Self-pay | Admitting: Internal Medicine

## 2014-01-01 DIAGNOSIS — K219 Gastro-esophageal reflux disease without esophagitis: Secondary | ICD-10-CM

## 2014-01-01 DIAGNOSIS — R000 Tachycardia, unspecified: Secondary | ICD-10-CM | POA: Insufficient documentation

## 2014-01-01 NOTE — Progress Notes (Addendum)
Patient ID: Briana Schultz, female   DOB: 08/04/1912, 80 y.o.   MRN: [id number]                   HISTORY & PHYSICAL  DATE:  12/31/2013    FACILITY: [facility]    LEVEL OF CARE:   SNF   CHIEF COMPLAINT:  Admission to SNF, post stay at [facility], 12/17/2013 through 12/22/2013.    HISTORY OF PRESENT ILLNESS:  This is a 80 year-old lady who lives in [place], [place].  She has a history of hypertension and dementia who lives at home with part-time caregivers.  She also has a daughter who lives in [place].    She suffered a fall at home.  She was brought to the ER where x-rays revealed a proximal right femur fracture.  She was admitted to hospital and underwent an ORIF on 12/17/2013.  She is on ASA for DVT prophylaxis.   Apparently, Vicodin causes confusion.  Therefore, she is on Ultram p.r.n. for pain.  She was already on Mobic b.i.d. for arthritis.    Perioperative problems included postoperative anemia for which she was transfused.  She had pulmonary edema, likely from fluid overload from blood transfusions.  She was started on diuretics and seemed to improve.    Apparently two days ago in this facility, she started to complain of chest pain.   There was actually an EKG done that showed right bundle branch block, a left anterior fascicular block, and ST-T changes in the anterior septal leads.  The patient then vomited and felt much better.  According to her care attendant, she has severe gastroesophageal reflux disease.    PAST MEDICAL HISTORY/PROBLEM LIST:    Hypertension.    Acute postoperative anemia.    Severe protein calorie malnutrition.    Pulmonary edema, felt to be secondary to transfusion overload.    CURRENT MEDICATIONS:  Discharge medications include:      ASA 325 q.d.    Vitamin D3, 5000 U once a day.    Colace 100 twice a day.    Ferrous sulfate 325 twice a day.    Mobic 7.5 daily.    Nexium 40 mg twice a day.    Senokot-S 1 tablet a day  p.r.n.    Calcium carbonate 1200 once a day.    Glucerna 237 once a day.    Xanax p.r.n.    SOCIAL HISTORY: HOUSING:  The patient lives in [place].   FUNCTIONAL STATUS:  She has caregivers.  She is able to walk with a walker and a cane.  She does not have a history of falls.    REVIEW OF SYSTEMS:   CARDIAC:   As noted above, the patient complained of chest pain two days ago.  This seemed to be relieved after vomiting.  Her EKG is listed above, interpreted as being unchanged from previous tracing perioperatively.   CHEST/RESPIRATORY:  No shortness of breath.        PHYSICAL EXAMINATION:   VITAL SIGNS:   RESPIRATIONS:  18.    PULSE:  86.   O2 SATURATIONS:  97% on room air.    GENERAL APPEARANCE:  The patient is not in any distress.   CHEST/RESPIRATORY:  Shallow, but otherwise clear air entry.   CARDIOVASCULAR:  CARDIAC:   Heart sounds are normal.  There is a 2/6 pansystolic murmur at the PMI, suggestive of mitral regurgitation.   She appears to be euvolemic.   GASTROINTESTINAL:  ABDOMEN:   Somewhat distended.   LIVER/SPLEEN/KIDNEYS:  No liver, no spleen.  No tenderness.   GENITOURINARY:  BLADDER:   Not distended.   CIRCULATION:   EDEMA/VARICOSITIES:  Extremities:  Significant venous stasis.   NEUROLOGICAL:    SENSATION/STRENGTH:  She appears to have antigravity strength in her extremities.   SKIN:  INSPECTION:  Her right leg incisions all look clean.     ASSESSMENT/PLAN:  Status post proximal right femur fracture.    Postoperative anemia, for which she was transfused.  However, she developed congestive heart failure.  She is not currently on any diuretics.    On aspirin for DVT prophylaxis.    Gastroesophageal reflux disease.   On chronic Nexium 40 b.i.d.    Complaints of chest pain that appeared to be resolved with postprandial vomiting.  This will need to be watched.  Her EKG suggests anterolateral T-wave changes, although this was unchanged.  I would discontinue the  Mobic at this point.  Use Ultram as ordered p.r.n. for pain.  I will follow next visit.  She does have GE relux and this may have been the issue.

## 2014-01-01 NOTE — Progress Notes (Signed)
Patient ID: Briana Schultz, female   DOB: 04/05/1913, 80 y.o.   MRN: [id number]   This is an acute visit.  Level of care skilled.  [facility]  Chief complaint-acute visit secondary to question tachycardia.  History of present illness.  Patient is a pleasant 80 year old female here for rehabilitation after sustaining a right femur fracture that was surgically repaired.  Postop this was complicated with acute blood loss anemia which has improved she is on iron.  Also had pulmonary edema thought from fluid overload with the blood transfusions this responded to diuretics.  Apparently over the weekend she complained of chest pain in a EKG was done which showed a sinus rhythm with premature supraventricular complexes-left anterior fascicular block T wave abnormality.  Dr. Leanord Hawkingobson did assess her yesterday she does have a significant history of GERD and was thought possibly this is where the pain actually came from she did have an episode of vomiting during this episode of the weekend and apparently the pain went away he  She is on Nexium for history of GERD.  Apparently during routine vitals today nursing tech obtain a pulse of over 120-she didn't check it manually and got 108.  I saw her shortly thereafter and got a pulse in the 90s-her blood pressure was stable I took it manually and got 134/80-the nursing tech earlier used a  machine and got a comparable reading with the systolic in the 120s She is not complaining of any chest pain dizziness syncopal-type feelings or palpitations.  .  I note Dr. Leanord Hawkingobson did discontinue her Mobic yesterday secondary to concerns this may be complicating matters.  Family medical social history has been reviewed per Dr. Jannetta Quintobson's note on 12/31/2013 and admission note on 12/27/2013  Medications have been [facility].  Review of systems.  In general no complaints of fever chills.  Respiratory does not complaining of shortness of breath or  cough.  Cardiac no chest pain and no significant edema.  GI has a significant history of GERD but she is sitting up today and does not complaining of symptoms.  No vomiting noted today.  Neurologic does not complain of dizziness or headache.  Muscle skeletal history of right hip fracture with repair she is not complaining of pain today.  Physical exam.  She is afebrile pulse of 96 respirations 18 blood pressure 134/80.  General this is a pleasant quite frail elderly female in no distress sitting comfortably in her chair.  Her skin is warm and dry.  Chest is clear to auscultation with somewhat shallow air entry no labored breathing.  Heart is regular rate and rhythm with an occasional irregular beat.--He has a 2/6 systolic murmur which is not new  Abdomen is soft nontender positive bowel sounds.  Neurologic-appears grossly intact her speech is clear no lateralizing findings.  Psych she does have some history of dementia but appears grossly at her baseline mental status she is alert interactive smiling and appropriate with conversation.  Labs.  12/26/2013.  WBC 8.3 hemoglobin 9.1 platelets 256.  Sodium 142 potassium 4.4 BUN 27 creatinine 0.69.  Assessment and plan.  . #1-tachycardia-this appears to have been transitory-she is asymptomatic does not complaining of any chest pain she had a recent EKG with findings as noted above-at this point continue to monitor vital signs pulse ox every shift to keep an eye on this-her vital signs are stable-I did reassess her a bit later and her heart rate was unchanged appear to be quite comfortable sitting  in her wheelchair with family.  #2-history of GERD that she is on Nexium twice a day he is not really complaining of GERD-type symptoms today or chest pain again this will be monitored-Mobic has been discontinued.  [id number]-of note more than 25 minutes spent assessing patient discussing her status with family at bedside-and reassessing  patient-iand formulating a plan of care

## 2014-01-02 ENCOUNTER — Encounter: Payer: Self-pay | Admitting: Internal Medicine

## 2014-01-02 ENCOUNTER — Encounter (HOSPITAL_COMMUNITY): Payer: Self-pay | Admitting: Emergency Medicine

## 2014-01-02 ENCOUNTER — Emergency Department (HOSPITAL_COMMUNITY): Payer: Medicare Other

## 2014-01-02 ENCOUNTER — Non-Acute Institutional Stay (SKILLED_NURSING_FACILITY): Payer: Medicare Other | Admitting: Internal Medicine

## 2014-01-02 ENCOUNTER — Emergency Department (HOSPITAL_COMMUNITY)
Admission: EM | Admit: 2014-01-02 | Discharge: 2014-01-02 | Disposition: A | Discharge status: Home / Self Care | Payer: Medicare Other | Attending: Emergency Medicine | Admitting: Emergency Medicine

## 2014-01-02 DIAGNOSIS — Z9181 History of falling: Secondary | ICD-10-CM | POA: Diagnosis not present

## 2014-01-02 DIAGNOSIS — R0682 Tachypnea, not elsewhere classified: Secondary | ICD-10-CM

## 2014-01-02 DIAGNOSIS — J810 Acute pulmonary edema: Secondary | ICD-10-CM

## 2014-01-02 DIAGNOSIS — I10 Essential (primary) hypertension: Secondary | ICD-10-CM | POA: Diagnosis not present

## 2014-01-02 DIAGNOSIS — Z87891 Personal history of nicotine dependence: Secondary | ICD-10-CM | POA: Insufficient documentation

## 2014-01-02 DIAGNOSIS — Z7982 Long term (current) use of aspirin: Secondary | ICD-10-CM | POA: Diagnosis not present

## 2014-01-02 DIAGNOSIS — Z8619 Personal history of other infectious and parasitic diseases: Secondary | ICD-10-CM | POA: Insufficient documentation

## 2014-01-02 DIAGNOSIS — Z8673 Personal history of transient ischemic attack (TIA), and cerebral infarction without residual deficits: Secondary | ICD-10-CM | POA: Insufficient documentation

## 2014-01-02 DIAGNOSIS — M129 Arthropathy, unspecified: Secondary | ICD-10-CM | POA: Insufficient documentation

## 2014-01-02 DIAGNOSIS — K219 Gastro-esophageal reflux disease without esophagitis: Secondary | ICD-10-CM | POA: Diagnosis not present

## 2014-01-02 DIAGNOSIS — Z79899 Other long term (current) drug therapy: Secondary | ICD-10-CM | POA: Diagnosis not present

## 2014-01-02 DIAGNOSIS — R0602 Shortness of breath: Secondary | ICD-10-CM | POA: Diagnosis not present

## 2014-01-02 DIAGNOSIS — I4891 Unspecified atrial fibrillation: Secondary | ICD-10-CM

## 2014-01-02 LAB — BASIC METABOLIC PANEL
ANION GAP: 11 (ref 5–15)
BUN: 16 mg/dL (ref 6–23)
CO2: 29 mEq/L (ref 19–32)
Calcium: 9.1 mg/dL (ref 8.4–10.5)
Chloride: 102 mEq/L (ref 96–112)
Creatinine, Ser: 0.7 mg/dL (ref 0.50–1.10)
GFR calc Af Amer: 81 mL/min — ABNORMAL LOW (ref >90)
GFR, EST NON AFRICAN AMERICAN: 70 mL/min — AB (ref >90)
Glucose, Bld: 115 mg/dL — ABNORMAL HIGH (ref 70–99)
Potassium: 4.6 mEq/L (ref 3.7–5.3)
SODIUM: 142 meq/L (ref 137–147)

## 2014-01-02 LAB — PROTIME-INR
INR: 1.07 (ref 0.00–1.49)
PROTHROMBIN TIME: 13.9 s (ref 11.6–15.2)

## 2014-01-02 LAB — APTT: APTT: 27 s (ref 24–37)

## 2014-01-02 LAB — CBC
HCT: 30.9 % — ABNORMAL LOW (ref 36.0–46.0)
HEMOGLOBIN: 10.3 g/dL — AB (ref 12.0–15.0)
MCH: 32.9 pg (ref 26.0–34.0)
MCHC: 33.3 g/dL (ref 30.0–36.0)
MCV: 98.7 fL (ref 78.0–100.0)
PLATELETS: 228 10*3/uL (ref 150–400)
RBC: 3.13 MIL/uL — ABNORMAL LOW (ref 3.87–5.11)
RDW: 16.8 % — ABNORMAL HIGH (ref 11.5–15.5)
WBC: 8.4 10*3/uL (ref 4.0–10.5)

## 2014-01-02 LAB — PRO B NATRIURETIC PEPTIDE: PRO B NATRI PEPTIDE: 456 pg/mL — AB (ref 0–450)

## 2014-01-02 LAB — I-STAT TROPONIN, ED: TROPONIN I, POC: 0 ng/mL (ref 0.00–0.08)

## 2014-01-02 MED ORDER — IPRATROPIUM-ALBUTEROL 0.5-2.5 (3) MG/3ML IN SOLN
3.0000 mL | Freq: Once | RESPIRATORY_TRACT | Status: AC
  Administered 2014-01-02: 3 mL via RESPIRATORY_TRACT
  Filled 2014-01-02: qty 3

## 2014-01-02 MED ORDER — NITROGLYCERIN 2 % TD OINT
1.0000 [in_us] | TOPICAL_OINTMENT | Freq: Once | TRANSDERMAL | Status: AC
  Administered 2014-01-02: 1 [in_us] via TOPICAL
  Filled 2014-01-02: qty 1

## 2014-01-02 MED ORDER — DIGOXIN 125 MCG PO TABS
0.2500 mg | ORAL_TABLET | Freq: Once | ORAL | Status: AC
  Administered 2014-01-02: 0.25 mg via ORAL
  Filled 2014-01-02: qty 1

## 2014-01-02 MED ORDER — IOHEXOL 350 MG/ML SOLN
100.0000 mL | Freq: Once | INTRAVENOUS | Status: AC | PRN
  Administered 2014-01-02: 100 mL via INTRAVENOUS

## 2014-01-02 MED ORDER — ASPIRIN EC 325 MG PO TBEC
325.0000 mg | DELAYED_RELEASE_TABLET | Freq: Once | ORAL | Status: AC
  Administered 2014-01-02: 325 mg via ORAL
  Filled 2014-01-02: qty 1

## 2014-01-02 NOTE — Progress Notes (Signed)
Patient ID: Briana Schultz, female   DOB: 06/09/1913, 80 y.o.   MRN: [id number] Facility; [facility] Chief complaint; increasing shortness of breath History; this is an elderly 5290 atrial woman we accepted in transfer from another nursing facility last week. I saw her for the first time 2 days ago. Over the weekend she had complained of chest pain which seemed much like GI-type discomfort relieved by belching and vomiting once. An EKG was done that is essentially unchanged showing a right bundle branch block left anterior fascicular block and anterior septal ST-T. She complained of chest pain in the hospital and has a history of mild to moderate aortic valve stenosis. Yesterday she was seen by our service for tachycardia with a pulse rate in the 110 range. She was seen and evaluated is stable. I seen her this morning and the patient is vaguely complaining of upper abdominal pain once again there is also complaints of some nausea without vomiting. I am not completely convinced this is chest pain however she is increasingly short of breath. Lab work from this morning showed a white count of 8.1, hemoglobin of 10.8 differential count shows 86% granulocytes. Her basic metabolic panel was normal.   During the patient's index hospitalization next phone and she had post operative anemia and was transfused the. She went into heart failure. Her troponin I was negative for. She was diuresed. She is not currently on diuretics    Zadie RhineORDERING     Rizwan, Saima [phone]  Tommi EmeryREFERRING    Rizwan, Saima [phone]  SONOGRAPHER  Dewitt HoesBilly Chung, RDCS  ADMITTING    Margarita RanaMurphy, Timothy D  PERFORMING   [facility], Inpatient  cc:  ------------------------------------------------------------------- LV EF: 60%  ------------------------------------------------------------------- History:   PMH:  Pulmonary edema.  Dyspnea.  Risk factors:  Anemia. Protein-calorie malnutrition. Acute pulmonary edema. Fall. Hyponatremia. Urinary retention. UTI. GERD.  Bladder mass. Hypertension.  ------------------------------------------------------------------- Study Conclusions  - Left ventricle: Technically difficult study. The cavity size was   normal. Wall thickness was increased in a pattern of mild LVH.   The estimated ejection fraction was 60%. Regional wall motion   abnormalities cannot be excluded. - Aortic valve: Sclerosis without stenosis. There was mild   regurgitation. - Left atrium: The atrium was mildly dilated. - Pulmonary arteries: PA peak pressure: 65 mm Hg (S).  Transthoracic echocardiography.  M-mode, complete 2D, spectral Doppler, and color Doppler.  Birthdate:  Patient birthdate: 1912/08/30.  Age:  Patient is 80 yr old.  Sex:  Gender: female. Height:  Height: 162.6 cm. Height: 64 in.  Weight:  Weight: 47.2 kg. Weight: 103.8 lb.  Body mass index:  BMI: 17.9 kg/m^2.  Body surface area:    BSA: 1.45 m^2.  Blood pressure:     146/60 Patient status:  Inpatient.  Study date:  Study date: 12/20/2013. Study time: 02:33 PM.  Location:  Bedside.  -------------------------------------------------------------------  ------------------------------------------------------------------- Left ventricle:  Technically difficult study. The cavity size was normal. Wall thickness was increased in a pattern of mild LVH. The estimated ejection fraction was 60%. Regional wall motion abnormalities cannot be excluded.  ------------------------------------------------------------------- Aortic valve:  Sclerosis without stenosis.  Doppler:  There was mild regurgitation.  ------------------------------------------------------------------- Aorta:  Aortic root: The aortic root was normal in size.  ------------------------------------------------------------------- Mitral valve:   Mildly thickened leaflets .  Doppler:  There was trivial regurgitation.  ------------------------------------------------------------------- Left atrium:  The atrium  was mildly dilated.  ------------------------------------------------------------------- Right ventricle:  The cavity size was normal. Systolic function was normal.  -------------------------------------------------------------------  Pulmonic valve:    The valve appears to be grossly normal. Doppler:  There was no significant regurgitation.  ------------------------------------------------------------------- Tricuspid valve:   Structurally normal valve.   Leaflet separation was normal.  Doppler:  Transvalvular velocity was within the normal range. There was mild regurgitation.  ------------------------------------------------------------------- Right atrium:  Poorly visualized.  ------------------------------------------------------------------- Pericardium:  There was no pericardial effusion.   ------------------------------------------------------------------- Prepared and Electronically Authenticated by  Willa RoughJeffrey Katz, MD 2015-06-18T17:59:31   Past Medical History  Diagnosis Date  . Fall     secondary to weakness as of  [place] OV  03/26/10  . H/O orthostatic hypotension   . Chest discomfort     anterior  . Hypertension   . Aortic valve stenosis     mild to moderate  . Syncope and collapse 2006  . Arthritis   . GERD (gastroesophageal reflux disease)   . Osteoporosis   . Abnormal EKG   . Scarlet fever   . Scoliosis   . TIA (transient ischemic attack) 1999   Past Surgical History  Procedure Laterality Date  . Cholecystectomy    . Cataract extraction Bilateral   . Femur im nail Right 12/17/2013    Procedure: INTRAMEDULLARY (IM) NAIL FEMORAL;  Surgeon: Sheral Apleyimothy D Murphy, MD;  Location: [facility] OR;  Service: Orthopedics;  Laterality: Right;   Current medication; ASA 325 daily Vitamin D 3 5000 units daily Colace 100 twice a day Ferrous sulfate 325 twice a day Omeprazole 40 mg twice a day Senokot 8.6/50 once a day Ultram 52 tablets every 6 when necessary Xanax 0.5 each  bedtime  Review of systems Respiratory; she is complaining of shortness of breath without clear coughing Cardiac; complaining of discomfort this may be abdominal may be chest I just can't tell GI continues with what sounds like gastroesophageal reflux type symptoms  Physical exam Gen. patient was tachypneic, and arrival in the room Vitals; her O2 sat is 95% on room air however she is tachypneic with a respiratory rate of 40 pulse rate 105-110 with some irregularity  Respiratory she is tachypneic which gets worse in the prone position. She has crackles in the left lower lobe right lung is clear there is no wheezing Cardiac heart sounds are somewhat irregular there is no murmurs no overt S3 or JVP is elevated at 45 to the angle of the jaw. There is no peripheral edema. She has venous stasis but no evidence of a DVT. There is no coccyx edema Abdomen; somewhat distended in the upper quadrants there is no liver or spleen she is not grossly tender Extremities as noted above she has venous stasis but no evidence of a DVT or cellulitis  Impression/plan #1 tachypnea with left lower lobe crackles. This may be heart failure but I can't acutely rule out as a left lower lobe pneumonia and/or the possibility of pulmonary embolism who is in a patient who has had recent right femur fracture and surgery.  she is going to need chest imaging possibly a CT scan of her chest. #2 chest/upper abdominal pain now with suggestion of possible heart failure. Today she is going to need to rule out for an MI #3 gastroesophageal reflux disease she is on proton pump inhibitors. I think this adds to the possibilities although I don't think it precludes an aggressive workup for 1 and 2 above   I Discussed things further with the family at least the granddaughter on the phone. She is no code and 80 years old however they were definitely  wanting her evaluated aggressively at least that was the tone on the phone. She will probably  need to be sent to the ER

## 2014-01-02 NOTE — ED Notes (Signed)
Pt from [facility], per EMS called out for SOB and tachypnea. nad noted at time of arrival. Pt 95% on room air. Pt alert and oriented. Airway patent. Pt denies any cp.

## 2014-01-02 NOTE — ED Provider Notes (Signed)
5:30 PM  Assumed care from Dr. Lynelle DoctorKnapp.  Pt is a 80 y.o. F who is a DNR with history of hypertension, aortic stenosis, recent hip fracture who presents the emergency department with shortness of breath for her we have facility. Patient has had a full workup here in the emergency department including negative troponin and negative CT of her chest. She will have episodes of intermittent atrial fibrillation that lasts no longer than 10 beats. She is otherwise hemodynamically stable and not hypoxic. Dr. Park BreedKahn with hospitalist service has seen the patient in the emergency department. Family would like to take the patient back to her nursing facility.  Patient's care was signed out to me so that I could talk to cardiology for further recommendations and outpatient followup. Hospitalist service recommends giving a loading dose of 0.25 mg digoxin now and close outpatient followup. She was seen by Dr. Myrtis SerKatz recently with cardiology for pre-op clearance. I spoke with patient and family at this time they do not want anticoagulation started. They understand that there are risks to given that she recently had a fall with hip fracture and is a high fall risk, they feel it would be unsafe to start this medication at this time. They will discuss this matter further with their cardiologist.   5:49 PM  Spoke with Dr. Rennis GoldenHilty with cardiology who does not feel she needs to be started on any rate controlling medications at this time and he does not feel that he would anticoagulate her. He agrees with close outpatient followup. Family is comfortable with this plan. Discussed strict return precautions.  Layla MawKristen N Ward, DO 01/02/14 1750

## 2014-01-02 NOTE — Progress Notes (Signed)
This encounter was created in error - please disregard.

## 2014-01-02 NOTE — Discharge Instructions (Signed)
Atrial Fibrillation  Atrial fibrillation is a type of irregular heart rhythm (arrhythmia). During atrial fibrillation, the upper chambers of the heart (atria) quiver continuously in a chaotic pattern. This causes an irregular and often rapid heart rate.   Atrial fibrillation is the result of the heart becoming overloaded with disorganized signals that tell it to beat. These signals are normally released one at a time by a part of the right atrium called the sinoatrial node. They then travel from the atria to the lower chambers of the heart (ventricles), causing the atria and ventricles to contract and pump blood as they pass. In atrial fibrillation, parts of the atria outside of the sinoatrial node also release these signals. This results in two problems. First, the atria receive so many signals that they do not have time to fully contract. Second, the ventricles, which can only receive one signal at a time, beat irregularly and out of rhythm with the atria.   There are three types of atrial fibrillation:   · Paroxysmal. Paroxysmal atrial fibrillation starts suddenly and stops on its own within a week.  · Persistent. Persistent atrial fibrillation lasts for more than a week. It may stop on its own or with treatment.  · Permanent. Permanent atrial fibrillation does not go away. Episodes of atrial fibrillation may lead to permanent atrial fibrillation.  Atrial fibrillation can prevent your heart from pumping blood normally. It increases your risk of stroke and can lead to heart failure.   CAUSES   · Heart conditions, including a heart attack, heart failure, coronary artery disease, and heart valve conditions.    · Inflammation of the sac that surrounds the heart (pericarditis).  · Blockage of an artery in the lungs (pulmonary embolism).  · Pneumonia or other infections.  · Chronic lung disease.  · Thyroid problems, especially if the thyroid is overactive (hyperthyroidism).  · Caffeine, excessive alcohol use, and use  of some illegal drugs.    · Use of some medicines, including certain decongestants and diet pills.  · Heart surgery.    · Birth defects.    Sometimes, no cause can be found. When this happens, the atrial fibrillation is called lone atrial fibrillation. The risk of complications from atrial fibrillation increases if you have lone atrial fibrillation and you are age 60 years or older.  RISK FACTORS  · Heart failure.  · Coronary artery disease.  · Diabetes mellitus.    · High blood pressure (hypertension).    · Obesity.    · Other arrhythmias.    · Increased age.  SYMPTOMS   · A feeling that your heart is beating rapidly or irregularly.    · A feeling of discomfort or pain in your chest.    · Shortness of breath.    · Sudden light-headedness or weakness.    · Getting tired easily when exercising.    · Urinating more often than normal (mainly when atrial fibrillation first begins).    In paroxysmal atrial fibrillation, symptoms may start and suddenly stop.  DIAGNOSIS   Your health care provider may be able to detect atrial fibrillation when taking your pulse. Your health care provider may have you take a test called an ambulatory electrocardiogram (ECG). An ECG records your heartbeat patterns over a 24-hour period. You may also have other tests, such as:  · Transthoracic echocardiogram (TTE). During echocardiography, sound waves are used to evaluate how blood flows through your heart.  · Transesophageal echocardiogram (TEE).  · Stress test. There is more than one type of stress test. If a stress test is   needed, ask your health care provider about which type is best for you.    · Chest X-ray exam.  · Blood tests.  · Computed tomography (CT).  TREATMENT   Treatment may include:  · Treating any underlying conditions. For example, if you have an overactive thyroid, treating the condition may correct atrial fibrillation.  · Taking medicine. Medicines may be given to control a rapid heart rate or to prevent blood clots, heart  failure, or a stroke.  · Having a procedure to correct the rhythm of the heart:  ¨ Electrical cardioversion. During electrical cardioversion, a controlled, low-energy shock is delivered to the heart through your skin. If you have chest pain, very low blood pressure, or sudden heart failure, this procedure may need to be done as an emergency.  ¨ Catheter ablation. During this procedure, heart tissues that send the signals that cause atrial fibrillation are destroyed.  ¨ Maze or minimaze procedure. During this surgery, thin lines of heart tissue that carry the abnormal signals are destroyed. The maze procedure is an open-heart surgery. The minimaze procedure is a minimally invasive surgery. This means that small cuts are made to access the heart instead of a large opening.  ¨ Pulmonary venous isolation. During this surgery, tissue around the veins that carry blood from the lungs (pulmonary veins) is destroyed. This tissue is thought to carry the abnormal signals.  HOME CARE INSTRUCTIONS   · Only take medicines that your health care provider approves, and take them as directed. Some medicines can make atrial fibrillation worse or recur.  · If blood thinners were prescribed by your health care provider, take them exactly as directed. Too much blood-thinning medicine can cause bleeding. If you take too little, you will not have the needed protection against stroke and other problems.  · Perform blood tests at home if directed by your health care provider. Perform blood tests exactly as directed.  · Quit smoking if you smoke.  · Do not drink alcohol.  · Do not drink caffeinated beverages such as coffee, soda, and some teas. You may drink decaffeinated coffee, soda, or tea.    · Maintain a healthy weight. Do not use diet pills unless your health care provider approves. They may make heart problems worse.    · Follow diet instructions as directed by your health care provider.  · Exercise regularly as directed by your health  care provider.  · Keep all follow-up appointments with your health care provider.  PREVENTION   The following substances can cause atrial fibrillation to recur:   · Caffeinated beverages.  · Alcohol.  · Certain medicines, especially those used for breathing problems.  · Certain herbs and herbal medicines, such as those containing ephedra or ginseng.  · Illegal drugs, such as cocaine and amphetamines.  Sometimes medicines are given to prevent atrial fibrillation from recurring. Proper treatment of any underlying condition is also important in helping prevent recurrence.   SEEK MEDICAL CARE IF:  · You notice a change in the rate, rhythm, or strength of your heartbeat.  · You suddenly begin urinating more frequently.  · You tire more easily when exerting yourself or exercising.  SEEK IMMEDIATE MEDICAL CARE IF:   · You have chest pain, abdominal pain, sweating, or weakness.  · You feel nauseous.  · You have shortness of breath.  · You suddenly have swollen feet and ankles.  · You feel dizzy.  · Your face or limbs feel numb or weak.  · You have a change   in your vision or speech.  MAKE SURE YOU:   · Understand these instructions.  · Will watch your condition.  · Will get help right away if you are not doing well or get worse.  Document Released: 06/21/2005 Document Revised: 06/26/2013 Document Reviewed: 08/01/2012  [employer]® Patient Information ©2015 [employer]. This information is not intended to replace advice given to you by your health care provider. Make sure you discuss any questions you have with your health care provider.

## 2014-01-02 NOTE — ED Provider Notes (Signed)
CSN: [id number]     Arrival date & time 01/02/14  1144 History   This chart was scribed for Ward GivensIva L Zaylah Blecha, MD by Ardelia Memsylan Malpass, ED Scribe. This patient was seen in room APA12/APA12 and the patient's care was started at 12:48 PM  Chief Complaint  Patient presents with  . Shortness of Breath    The history is provided by the patient and a relative. No language interpreter was used.   HPI Comments: Briana Schultz is a 80 y.o. female who presents to the Emergency Department complaining of new, shortness of breath that started about 3 days ago. Pt states that she has some new chest discomfort that she describes as feeling 'tight" on her left side. Pt also states that she has had left shoulder pain off and on for the past 6 months. She has never had any type of breathing problems before.  Pt broke her hip 3 weeks ago and underwent surgery. Pt is currently undergoing physical therapy where is just starting to take a few steps. Pt was living at home prior to her fall.  Pt denies coughing, fever and leg swelling. Per pt's daughter the pt previously saw Dr. Reyes IvanKersey for slight heart valve leakage several years ago.   Family reports that pt had a prior MI per EKG finding that she was not aware of.  PCP: Dr. Andrey CampanileWilson Former Cardiologist: Dr. Reyes IvanKersey  Patient is DO NOT RESUSCITATE  Past Medical History  Diagnosis Date  . Fall     secondary to weakness as of  [place] [facility]  03/26/10  . H/O orthostatic hypotension   . Chest discomfort     anterior  . Hypertension   . Aortic valve stenosis     mild to moderate  . Syncope and collapse 2006  . Arthritis   . GERD (gastroesophageal reflux disease)   . Osteoporosis   . Abnormal EKG   . Scarlet fever   . Scoliosis   . TIA (transient ischemic attack) 1999   Past Surgical History  Procedure Laterality Date  . Cholecystectomy    . Cataract extraction Bilateral   . Femur im nail Right 12/17/2013    Procedure: INTRAMEDULLARY (IM) NAIL FEMORAL;  Surgeon: Sheral Apleyimothy  D Murphy, MD;  Location: [facility] OR;  Service: Orthopedics;  Laterality: Right;   Family History  Problem Relation Age of Onset  . Pneumonia Mother   . Heart failure Father   . Diabetes Father   . Heart disease Sister    History  Substance Use Topics  . Smoking status: Former Smoker    Quit date: 01/16/1981  . Smokeless tobacco: Never Used  . Alcohol Use: No   In rehab, was living at home  OB History   Grav Para Term Preterm Abortions TAB SAB Ect Mult Living                 Review of Systems  Constitutional: Negative for fever.  Respiratory: Positive for shortness of breath. Negative for cough.   All other systems reviewed and are negative.     Allergies  Review of patient's allergies indicates no known allergies.  Home Medications   Prior to Admission medications   Medication Sig Start Date End Date Taking? Authorizing Provider  ALPRAZolam Prudy Feeler(XANAX) 0.5 MG tablet Take one tablet by mouth at bedtime to help rest 12/26/13  Yes Mahima Glade LloydPandey, MD  alum & mag hydroxide-simeth (MAALOX/MYLANTA) 200-200-20 MG/5ML suspension Take 30 mLs by mouth 2 (two) times daily as needed for  indigestion.   Yes Historical Provider, MD  aspirin EC 325 MG EC tablet Take 1 tablet (325 mg total) by mouth daily with breakfast. 12/19/13  Yes Calvert CantorSaima Rizwan, MD  calcium carbonate (OS-CAL) 600 MG TABS Take 1,200 mg by mouth daily with breakfast.    Yes Historical Provider, MD  cholecalciferol (VITAMIN D) 1000 UNITS tablet Take 5,000 Units by mouth once a week. On Fridays. Confirmed by family member   Yes Historical Provider, MD  docusate sodium (COLACE) 100 MG capsule Take 1 capsule (100 mg total) by mouth 2 (two) times daily. Continue this while taking narcotics to help with bowel movements 12/17/13  Yes Sheral Apleyimothy D Murphy, MD  feeding supplement (GLUCERNA SHAKE) LIQD Take 237 mLs by mouth 3 (three) times daily between meals.  01/19/12  Yes Vassie Lollarlos Madera, MD  ferrous sulfate 325 (65 FE) MG tablet Take 1 tablet (325  mg total) by mouth 2 (two) times daily with a meal. 12/19/13  Yes Calvert CantorSaima Rizwan, MD  Multiple Vitamin (MULTIVITAMIN WITH MINERALS) TABS Take 1 tablet by mouth daily.   Yes Historical Provider, MD  nitroGLYCERIN (NITROSTAT) 0.4 MG SL tablet Place 0.4 mg under the tongue every 5 (five) minutes as needed for chest pain.   Yes Historical Provider, MD  omeprazole (PRILOSEC) 40 MG capsule Take 40 mg by mouth 2 (two) times daily.   Yes Historical Provider, MD  sennosides-docusate sodium (SENOKOT-S) 8.6-50 MG tablet Take 1 tablet by mouth daily as needed for constipation.   Yes Historical Provider, MD  traMADol (ULTRAM) 50 MG tablet Take one to two tablets by mouth every 6 hours as needed for moderate to severe pain 12/26/13  Yes Mahima Glade LloydPandey, MD   Triage Vitals: BP 144/61  Pulse 85  Temp(Src) 97.6 F (36.4 C) (Oral)  Resp 25  Ht 5\' 2"  (1.575 m)  Wt 96 lb (43.545 kg)  BMI 17.55 kg/m2  SpO2 95%  Vital signs normal    Physical Exam  Nursing note and vitals reviewed. Constitutional: She is oriented to person, place, and time.  Non-toxic appearance. She does not appear ill. No distress.  Elderly, frail female.   HENT:  Head: Normocephalic and atraumatic.  Right Ear: External ear normal.  Left Ear: External ear normal.  Nose: Nose normal. No mucosal edema or rhinorrhea.  Mouth/Throat: Oropharynx is clear and moist and mucous membranes are normal. No dental abscesses or uvula swelling.  Eyes: Conjunctivae and EOM are normal. Pupils are equal, round, and reactive to light.  Neck: Normal range of motion and full passive range of motion without pain. Neck supple.  Cardiovascular: Normal rate and regular rhythm.  Exam reveals no gallop and no friction rub.   No murmur heard  Pulmonary/Chest: Effort normal and breath sounds normal. No respiratory distress. She has no wheezes. She has no rhonchi. She has no rales. She exhibits no tenderness and no crepitus.  Abdominal: Soft. Normal appearance and bowel  sounds are normal. She exhibits no distension. There is no tenderness. There is no rebound and no guarding.  Musculoskeletal: Normal range of motion. She exhibits no edema and no tenderness.  Moves all extremities well. Marked Kyphosis present. No edema in legs.  Neurological: She is alert and oriented to person, place, and time. She has normal strength. No cranial nerve deficit.  Skin: Skin is warm, dry and intact. No rash noted. No erythema. No pallor.  Psychiatric: She has a normal mood and affect. Her speech is normal and behavior is normal. Her mood  appears not anxious.    ED Course  Procedures (including critical care time) DIAGNOSTIC STUDIES: Oxygen Saturation is 95% on RA, adequate by my interpretation.    COORDINATION OF CARE: 12:52 PM- Reviewed EKG which was very similar to EKG taken earlier this month. Will perform labs and radiology to rule out a blood clot. Will order nitroglycerin and aspirin. Pt advised of plan for treatment and pt agrees.  Recheck at 15:20 patient states her chest discomfort is gone. She still appears to be mildly short of breath. We discussed her test results which were unrevealing as to the source of her difficulty breathing. Patient states she used to smoke but quit many years ago. We're going to try a nebulizer treatment to see if that helps.  Recheck 16: 50 Pt still seems a little tachypneic. On monitor it appears she is having small short runs of afib with rates up to 110 then back to NSR.  This appears to be a new problem.   On review of the records sent from her nursing home patient was evaluated by Dr. Myrtis Ser for her preop assessment for echo showed ejection fraction of about 60%. The echo was technically difficult. Patient is a DO NOT RESUSCITATE. Patient does not want to be admitted. They would prefer she goes back to her nursing facility if possible. They just want her to be comfortable. The issue is she is having short bursts of atrial fib with a  relatively low tachycardia rate of 110. Patient could be placed on a beta blocker. The decision also need to be made about what type of blood thinner she should be on. I felt the patient should be evaluated by the hospitalist to make these major decisions.  17:21 Dr Welton Flakes states to talk to cardiology to discuss medications to discharge her back to her rehab facility.   17:22 Dr Elesa Massed will follow up with cardiology for med recommendations.    Labs Review Results for orders placed during the hospital encounter of 01/02/14  BASIC METABOLIC PANEL      Result Value Ref Range   Sodium 142  137 - 147 mEq/L   Potassium 4.6  3.7 - 5.3 mEq/L   Chloride 102  96 - 112 mEq/L   CO2 29  19 - 32 mEq/L   Glucose, Bld 115 (*) 70 - 99 mg/dL   BUN 16  6 - 23 mg/dL   Creatinine, Ser 1.61  0.50 - 1.10 mg/dL   Calcium 9.1  8.4 - 09.6 mg/dL   GFR calc non Af Amer 70 (*) >90 mL/min   GFR calc Af Amer 81 (*) >90 mL/min   Anion gap 11  5 - 15  CBC      Result Value Ref Range   WBC 8.4  4.0 - 10.5 K/uL   RBC 3.13 (*) 3.87 - 5.11 MIL/uL   Hemoglobin 10.3 (*) 12.0 - 15.0 g/dL   HCT 04.5 (*) 40.9 - 81.1 %   MCV 98.7  78.0 - 100.0 fL   MCH 32.9  26.0 - 34.0 pg   MCHC 33.3  30.0 - 36.0 g/dL   RDW 91.4 (*) 78.2 - 95.6 %   Platelets 228  150 - 400 K/uL  PRO B NATRIURETIC PEPTIDE      Result Value Ref Range   Pro B Natriuretic peptide (BNP) 456.0 (*) 0 - 450 pg/mL  APTT      Result Value Ref Range   aPTT 27  24 - 37 seconds  PROTIME-INR      Result Value Ref Range   Prothrombin Time 13.9  11.6 - 15.2 seconds   INR 1.07  0.00 - 1.49  I-STAT TROPOININ, ED      Result Value Ref Range   Troponin i, poc 0.00  0.00 - 0.08 ng/mL   Comment 3             Laboratory interpretation all normal except improved anemia, decreasing BNP   Imaging Review Ct Angio Chest W/cm &/or Wo Cm  01/02/2014   CLINICAL DATA:  80 year old female with shortness of breath and chest pain. Recent right hip surgery. Initial encounter.   EXAM: CT ANGIOGRAPHY CHEST WITH CONTRAST  TECHNIQUE: Multidetector CT imaging of the chest was performed using the standard protocol during bolus administration of intravenous contrast. Multiplanar CT image reconstructions and MIPs were obtained to evaluate the vascular anatomy.  CONTRAST:  OMNIPAQUE IOHEXOL 350 MG/ML SOLN  COMPARISON:  Chest radiographs 12/30/2013 and earlier. CT Abdomen and Pelvis 01/06/2012.  FINDINGS: Good contrast bolus timing in the pulmonary arterial tree. No focal filling defect identified in the pulmonary arterial tree to suggest the presence of acute pulmonary embolism.  Widespread calcified atherosclerosis and ectasia of the aorta. Coronary artery calcified atherosclerosis. Stable mild cardiomegaly. No pericardial effusion. No mediastinal lymphadenopathy.  Visualized upper abdominal viscera are stable.  Major airways are patent. Large lung volumes. Increased AP dimension to the chest. Minor dependent atelectasis. Mild biapical scarring.  Osteopenia. Chronic left lateral rib fractures. Exaggerated thoracic kyphosis. No acute osseous abnormality identified.  Review of the MIP images confirms the above findings.  IMPRESSION: 1.  No evidence of acute pulmonary embolus. 2. Cardiomegaly.  Calcified atherosclerosis. 3. Pulmonary hyperinflation.  Minor atelectasis.   Electronically Signed   By: Augusto Gamble M.D.   On: 01/02/2014 14:01     EKG Interpretation   Date/Time:  Wednesday January 02 2014 11:49:58 EDT Ventricular Rate:  98 PR Interval:    QRS Duration: 99 QT Interval:  392 QTC Calculation: 500 R Axis:   -60 Text Interpretation:  Normal sinus rhythm Consider right ventricular  hypertrophy Left ventricular hypertrophy Inferior infarct, age  indeterminate Lateral infarct, acute (LAD) Prolonged QT interval No  significant change since last tracing 20 Dec 2013 Confirmed by Sidda Humm   MD-I, Norberta Stobaugh ([id number]) on 01/02/2014 12:55:06 PM      MDM   Final diagnoses:  Shortness of breath   New onset atrial fibrillation   Disposition pending  Devoria Albe, MD, FACEP   I personally performed the services described in this documentation, which was scribed in my presence. The recorded information has been reviewed and considered.  Devoria Albe, MD, Armando Gang    Ward Givens, MD 01/02/14 [phone]

## 2014-01-03 ENCOUNTER — Ambulatory Visit (INDEPENDENT_AMBULATORY_CARE_PROVIDER_SITE_OTHER): Payer: Medicare Other | Admitting: Cardiology

## 2014-01-03 ENCOUNTER — Encounter: Payer: Self-pay | Admitting: Cardiology

## 2014-01-03 VITALS — BP 157/69 | HR 94 | Ht 63.0 in

## 2014-01-03 DIAGNOSIS — R0789 Other chest pain: Secondary | ICD-10-CM

## 2014-01-03 DIAGNOSIS — R002 Palpitations: Secondary | ICD-10-CM

## 2014-01-03 DIAGNOSIS — J810 Acute pulmonary edema: Secondary | ICD-10-CM

## 2014-01-03 NOTE — Patient Instructions (Signed)
Your physician recommends that you schedule a follow-up appointment in:  2-3 weeks with Joni ReiningKathryn Lawrence NP  Your physician recommends that you continue on your current medications as directed. Please refer to the Current Medication list given to you today.    Your physician has recommended that you wear a 48  holter monitor. Holter monitors are medical devices that record the heart's electrical activity. Doctors most often use these monitors to diagnose arrhythmias. Arrhythmias are problems with the speed or rhythm of the heartbeat. The monitor is a small, portable device. You can wear one while you do your normal daily activities. This is usually used to diagnose what is causing palpitations/syncope (passing out).   Thank you for choosing [facility]!!

## 2014-01-03 NOTE — Progress Notes (Signed)
Clinical Summary Briana Schultz is a 80 y.o.female seen as a new patient after recent hospital admission.  1. Fall - recent admit 12/17/13 after fall at home. No reported LOC, apparent mechanical fall.  - xrays showed right femur fracture - problems with anemia after surgery, transfused 2 units pRBCs. Started on po iron at discharge - s/p repair by ortho, discharged to rehab 12/22/13.   2. Pulmonary edema - developed during recent admission with hip fracture, from notes she received significant fluid volumes from blood products and IVF.  - patient became hypoxic, acutely volume overloaded. Diuresed 5.4 liters - echo 12/20/13 LVEF 60%, PASP 65. Diastolic function is not described with no reported parameters. There was mild AI.   3. Chest pain - chest pain over the last weekend reportedly better with belching, vomiting - seen in ER 01/02/14 with symptoms. EKG without acute changes - reported runs of afib on ER tele monitor. Strips in epic reviewed, do not see evidence of afib.  - increased SOB, tachypnea that primarily is episodic.  - CT PE negative for PE. + coronary atherosclerosis. Trop neg, pro-BNP 456. - family did not want admission, patient discharged back to nursing home.   - left arm pain and left chest pain, aching pain mild. Nothing makes worst. Can have some palps, some SOB with it. Lasts a few minutes and then resolves. Occurs every day, often times at night.  - of note she was on coreg 3.125mg  bid, was not on discharge medications list. From notes she was hypotensive and beta blocker was held, apparently norvasc and diovan were also stopped.    Past Medical History  Diagnosis Date  . Fall     secondary to weakness as of  [place] OV  03/26/10  . H/O orthostatic hypotension   . Chest discomfort     anterior  . Hypertension   . Aortic valve stenosis     mild to moderate  . Syncope and collapse 2006  . Arthritis   . GERD (gastroesophageal reflux disease)   . Osteoporosis    . Abnormal EKG   . Scarlet fever   . Scoliosis   . TIA (transient ischemic attack) 1999     No Known Allergies   Current Outpatient Prescriptions  Medication Sig Dispense Refill  . ALPRAZolam (XANAX) 0.5 MG tablet Take one tablet by mouth at bedtime to help rest  30 tablet  5  . alum & mag hydroxide-simeth (MAALOX/MYLANTA) 200-200-20 MG/5ML suspension Take 30 mLs by mouth 2 (two) times daily as needed for indigestion.      Marland Kitchen. aspirin EC 325 MG EC tablet Take 1 tablet (325 mg total) by mouth daily with breakfast.  30 tablet  0  . calcium carbonate (OS-CAL) 600 MG TABS Take 1,200 mg by mouth daily with breakfast.       . cholecalciferol (VITAMIN D) 1000 UNITS tablet Take 5,000 Units by mouth once a week. On Fridays. Confirmed by family member      . docusate sodium (COLACE) 100 MG capsule Take 1 capsule (100 mg total) by mouth 2 (two) times daily. Continue this while taking narcotics to help with bowel movements  30 capsule  1  . feeding supplement (GLUCERNA SHAKE) LIQD Take 237 mLs by mouth 3 (three) times daily between meals.       . ferrous sulfate 325 (65 FE) MG tablet Take 1 tablet (325 mg total) by mouth 2 (two) times daily with a meal.  3  . Multiple Vitamin (MULTIVITAMIN WITH MINERALS) TABS Take 1 tablet by mouth daily.      . nitroGLYCERIN (NITROSTAT) 0.4 MG SL tablet Place 0.4 mg under the tongue every 5 (five) minutes as needed for chest pain.      Marland Kitchen. omeprazole (PRILOSEC) 40 MG capsule Take 40 mg by mouth 2 (two) times daily.      . sennosides-docusate sodium (SENOKOT-S) 8.6-50 MG tablet Take 1 tablet by mouth daily as needed for constipation.      . traMADol (ULTRAM) 50 MG tablet Take one to two tablets by mouth every 6 hours as needed for moderate to severe pain  240 tablet  5   No current facility-administered medications for this visit.     Past Surgical History  Procedure Laterality Date  . Cholecystectomy    . Cataract extraction Bilateral   . Femur im nail Right  12/17/2013    Procedure: INTRAMEDULLARY (IM) NAIL FEMORAL;  Surgeon: Sheral Apleyimothy D Murphy, MD;  Location: [facility] OR;  Service: Orthopedics;  Laterality: Right;     No Known Allergies    Family History  Problem Relation Age of Onset  . Pneumonia Mother   . Heart failure Father   . Diabetes Father   . Heart disease Sister      Social History Briana Schultz reports that she quit smoking about 32 years ago. She has never used smokeless tobacco. Briana Schultz reports that she does not drink alcohol.   Review of Systems CONSTITUTIONAL: No weight loss, fever, chills, weakness or fatigue.  HEENT: Eyes: No visual loss, blurred vision, double vision or yellow sclerae.No hearing loss, sneezing, congestion, runny nose or sore throat.  SKIN: No rash or itching.  CARDIOVASCULAR: per HPI RESPIRATORY: SOB GASTROINTESTINAL: No anorexia, nausea, vomiting or diarrhea. No abdominal pain or blood.  GENITOURINARY: No burning on urination, no polyuria NEUROLOGICAL: No headache, dizziness, syncope, paralysis, ataxia, numbness or tingling in the extremities. No change in bowel or bladder control.  MUSCULOSKELETAL: No muscle, back pain, joint pain or stiffness.  LYMPHATICS: No enlarged nodes. No history of splenectomy.  PSYCHIATRIC: No history of depression or anxiety.  ENDOCRINOLOGIC: No reports of sweating, cold or heat intolerance. No polyuria or polydipsia.  Marland Kitchen.   Physical Examination p 94 bp 157/69 No wt documented Gen: resting comfortably, no acute distress HEENT: no scleral icterus, pupils equal round and reactive, no palptable cervical adenopathy,  CV: RRR, no m/r/g, no JVD Resp: Clear to auscultation bilaterally GI: abdomen is soft, non-tender, non-distended, normal bowel sounds, no hepatosplenomegaly MSK: extremities are warm, no edema.  Skin: warm, no rash Neuro:  no focal deficits Psych: appropriate affect   Diagnostic Studies  01/02/14 EKG Sinus, incomplete RBBB with LAFB  12/20/13 Study  Conclusions  - Left ventricle: Technically difficult study. The cavity size was normal. Wall thickness was increased in a pattern of mild LVH. The estimated ejection fraction was 60%. Regional wall motion abnormalities cannot be excluded. - Aortic valve: Sclerosis without stenosis. There was mild regurgitation. - Left atrium: The atrium was mildly dilated. - Pulmonary arteries: PA peak pressure: 65 mm Hg (S).    Assessment and Plan  1. Palpitations - episodic palpitations with chest pain and SOB. ER notes report noted afib on tele, strips reviewed only show NSR, unclear if true afib or note - will obtain 48 hour holter monitor  2. Chest pain - no evidence of ACS by prior EKGs and enzymes, may be related to symptomatic arrhythmia. She did have  some evidence of coronary calcification on CT which may suggest underlying obstructive CAD. Her symptoms are fairly atypical for ischemia - given her advanced age neither the patient, family, nor myself feel like aggressive CAD evaluation at this time is the best option for her. - will evaluation for symptomatic arrhythmia, consider restarting back beta blocker pending monitor results and also potentially as antianginal  3. Pulmonary edema - patient with episode of pulm edema during admission for hip surgery after receiving blood and IVF. Normal LVEF, mild AI. Diasotlic function not described, she did have moderate to severe pulm HTN. This occurred in the setting of volume overload and much of that could have been related to elevated LA pressures - will review echo, now that euvolemic may need repeat echo to eval PA pressures - may consider low dose diuretic at follow up pending her symptoms  F/u 2-3 weeks      Antoine Poche, M.D., F.A.C.C.

## 2014-01-04 ENCOUNTER — Encounter: Payer: Self-pay | Admitting: Internal Medicine

## 2014-01-04 ENCOUNTER — Non-Acute Institutional Stay (SKILLED_NURSING_FACILITY): Payer: Medicare Other | Admitting: Internal Medicine

## 2014-01-04 DIAGNOSIS — R000 Tachycardia, unspecified: Secondary | ICD-10-CM

## 2014-01-04 DIAGNOSIS — K219 Gastro-esophageal reflux disease without esophagitis: Secondary | ICD-10-CM

## 2014-01-04 DIAGNOSIS — R0600 Dyspnea, unspecified: Secondary | ICD-10-CM

## 2014-01-04 DIAGNOSIS — R0989 Other specified symptoms and signs involving the circulatory and respiratory systems: Secondary | ICD-10-CM

## 2014-01-04 DIAGNOSIS — R0609 Other forms of dyspnea: Secondary | ICD-10-CM

## 2014-01-06 NOTE — Progress Notes (Signed)
Patient ID: Briana Schultz, female   DOB: 09/21/1912, 80 y.o.   MRN: [id number]   This is an acute visit.  Level of care skilled.  [facility]  Date is 01/04/2014.  She complaint-acute visit followup tachycardia-GERD symptoms.  History of present illness.  Patient is a pleasant 80 year old female here for rehabilitation after sustaining a hip fracture with repair-she has complained at times of intermittent chest pain and shortness of breath and also has had tachycardia-in fact Dr. Leanord Hawkingobson saw her a couple days ago and she was sent to the ER to rule out any MI or cardiac issue-ER did not show any evidence of an acute coronary event her EKG and cardiac enzymes-family did not desire intensive aggressive treatment of coronary artery disease-she also has seen cardiology may consider restarting her beta blocker pending a home Holter monitor which they are performing I note her BNP in the ER was 456 which apparently is improved.  The tachycardia appears more stable today pulse rates have been largely in the 90s occasionally low 100s she is not symptomatic of chest pain or any increased shortness of breath.  Her main complain is GERD-like symptoms which is somewhat chronic she is on Prilosec although this appears not to be helping a whole lot.  Family medical social history as been reviewed most recently progress note on 01/02/2014.  Medications have been reviewed per MAR.  Again beta blocker was stopped in the hospital I believe secondary to hypotension.  .  Review of systems.  General she no complaints of fever or chills.  Rest rate is not complaining of shortness of breath or cough.  Cardiac is not complaining of chest pain today she does not really have significant lower extremity edema.  GI does complain of some GERD-like symptoms with eating some burning mid chest after eating that makes her uncomfortable and she is on Prilosec.  Muscle skeletal is not complaining of hip or  joint pain currently she is very frail however.  Neurologic is not complaining of any dizziness or syncopal-type feelings or headache.  Physical exam.  Temperature is 98.2 pulse 96 respirations 20 blood pressure 138/50.  In general this is a frail elderly female in no distress .  Her skin is warm and dry.  Oropharynx is clear mucous membranes moist.  Chest is clear to auscultation with shallow air entry no labored breathing.  Heart is regular rate and rhythm with some irregular beats heart rate in the 90s there is no lower extremity edema  Abdomen is soft nontender with positive bowel sounds.  Muscle skeletal moves all extremities x4 with general frailty.  Neurologic is grossly intact her speech is clear.  Labs.  01/02/2014.  WBC 8.1 hemoglobin 10.8 platelets 221.  Sodium 142 potassium 4.3 BUN 15 creatinine 0.69.  12/27/2013.  Liver function tests within normal limits except albumin of 3.4.  Assessment and plan.  #1-tachycardia chest pain-again workup did not really show any acute event at this point appears to be stable continue to monitor some of this chest pain may be related to her GERD symptoms-she is followed by cardiology they are doing a Holter monitor and then may consider low-dose diuretic followup as well as possibly restarting a beta blocker.  #2-GERD symptoms-she has been on Prilosec apparently not getting much relief will switch her to Nexium 40 mg twice a day and monitor.  Also will update lab work including a CBC and CMP next week for followup.  [id number]

## 2014-01-07 ENCOUNTER — Non-Acute Institutional Stay (SKILLED_NURSING_FACILITY): Payer: Medicare Other | Admitting: Internal Medicine

## 2014-01-07 DIAGNOSIS — J810 Acute pulmonary edema: Secondary | ICD-10-CM

## 2014-01-09 ENCOUNTER — Ambulatory Visit (HOSPITAL_COMMUNITY)
Admission: RE | Admit: 2014-01-09 | Discharge: 2014-01-09 | Disposition: A | Discharge status: Home / Self Care | Payer: Medicare Other | Source: Ambulatory Visit | Attending: Cardiology | Admitting: Cardiology

## 2014-01-09 DIAGNOSIS — R002 Palpitations: Secondary | ICD-10-CM

## 2014-01-09 DIAGNOSIS — Z136 Encounter for screening for cardiovascular disorders: Secondary | ICD-10-CM | POA: Insufficient documentation

## 2014-01-09 DIAGNOSIS — R079 Chest pain, unspecified: Secondary | ICD-10-CM

## 2014-01-09 NOTE — Progress Notes (Signed)
48 hour Holter Monitor in progress. 

## 2014-01-14 ENCOUNTER — Non-Acute Institutional Stay (SKILLED_NURSING_FACILITY): Payer: Medicare Other | Admitting: Internal Medicine

## 2014-01-14 DIAGNOSIS — K219 Gastro-esophageal reflux disease without esophagitis: Secondary | ICD-10-CM

## 2014-01-14 DIAGNOSIS — I5041 Acute combined systolic (congestive) and diastolic (congestive) heart failure: Secondary | ICD-10-CM

## 2014-01-14 NOTE — Progress Notes (Addendum)
Patient ID: Briana Schultz, female   DOB: 07/20/1912, 80 y.o.   MRN: [id number]                PROGRESS NOTE  DATE:  01/07/2014    FACILITY: [facility]    LEVEL OF CARE:   SNF   Acute Visit   HISTORY OF PRESENT ILLNESS:  This is a patient whom I saw last week.  She had vaguely complained of chest pain, perhaps GI pain.   An EKG was unchanged.    I saw her on 01/02/2014.  At that point, she was tachypneic with left lower lobe crackles.  I was concerned about developing pneumonia, possibly pulmonary embolism or CHF.  She was seen in the ER.  A CT scan of the chest was negative for PE or pneumonia.  She was reviewed by Cardiology, as well.  Her echocardiogram from the hospital (at which time she had gone into congestive heart failure probably due to fluid overload, transfusions) showed an EF of 60%, aortic valve sclerosis without stenosis.  Her pulmonary artery pressures were high.  However, this may have been due to heart failure.    REVIEW OF SYSTEMS:   CHEST/RESPIRATORY:   The patient feels much better.   CARDIAC:    No complaints of chest pain.   CHEST/RESPIRATORY:  No shortness of breath.   GI:  No nausea or vomiting.    PHYSICAL EXAMINATION:   VITAL SIGNS:   PULSE:  84.   RESPIRATIONS:  18 and unlabored.   O2 SATURATIONS:  96% on room air.   GENERAL APPEARANCE:  The patient looks much better than when I saw her last Wednesday.     CHEST/RESPIRATORY:  Shallow, but otherwise clear air entry bilaterally.   CARDIOVASCULAR:  CARDIAC:   Heart sounds are normal.  Her JVP is elevated and there is scant coccyx edema.   GASTROINTESTINAL:  ABDOMEN:   Soft.  There is no tenderness.   CIRCULATION:   EDEMA/VARICOSITIES:   Extremities:  She has venous stasis, but no evidence of a DVT.    ASSESSMENT/PLAN:  Perhaps some degree of fluid volume overload.  Her BNP when she was in the emergency room was 456.  This was actually an improvement from a value in 2013 of 871.   Nevertheless, I think she may require a small dose of Lasix.  I am going to put her on 20 mg every other day.  Along with monitoring her status, I do not think anything else is required here.  There is no evidence of a pulmonary embolism or pneumonia.

## 2014-01-16 NOTE — Progress Notes (Addendum)
Patient ID: Briana AdolphHallie M Schultz, female   DOB: 05/02/1913, 80 y.o.   MRN: [id number]               PROGRESS NOTE  DATE:  01/14/2014    FACILITY: [facility]    LEVEL OF CARE:   SNF   Acute Visit   HISTORY OF PRESENT ILLNESS:    This is a 80 year-old woman who came here after sustaining a hip fracture.   Shortly after this, she developed chest pain, shortness of breath and hypoxemia.  She was readmitted to hospital, I believe for observation.  A CT scan did not show a pulmonary embolism.    The patient has been doing reasonably well.  She is not complaining of chest pain or shortness of breath.    PHYSICAL EXAMINATION:   VITAL SIGNS:   O2 SATURATIONS:  95% on room air.   RESPIRATIONS:  18 and unlabored.   CHEST/RESPIRATORY:  Shallow, but otherwise clear air entry bilaterally.   CARDIOVASCULAR:  CARDIAC:   Heart sounds are normal.  There are no murmurs.  No elevation of jugular venous pressure.    ASSESSMENT/PLAN:  Diastolically-mediated congestive heart failure.  This appears to be stable.    History of coronary artery disease.  They did not want this aggressively treated in the hospital.  She has a Holter monitor in place.    The patient appears to be stable.   There was some suggestion about restarting a beta blocker.  For now, I am not going to do this.    She also continues to have symptomatic GERD.  She is on Nexium 40 mg twice a day.

## 2014-01-17 ENCOUNTER — Other Ambulatory Visit: Payer: Self-pay | Admitting: *Deleted

## 2014-01-17 DIAGNOSIS — R002 Palpitations: Secondary | ICD-10-CM

## 2014-01-18 ENCOUNTER — Ambulatory Visit: Payer: Medicare Other | Admitting: Adult Health

## 2014-01-21 ENCOUNTER — Ambulatory Visit (INDEPENDENT_AMBULATORY_CARE_PROVIDER_SITE_OTHER): Payer: Medicare Other | Admitting: Physician Assistant

## 2014-01-21 ENCOUNTER — Encounter: Payer: Self-pay | Admitting: Physician Assistant

## 2014-01-21 VITALS — BP 138/60 | HR 88 | Ht 61.0 in | Wt 96.0 lb

## 2014-01-21 DIAGNOSIS — R000 Tachycardia, unspecified: Secondary | ICD-10-CM

## 2014-01-21 DIAGNOSIS — J810 Acute pulmonary edema: Secondary | ICD-10-CM

## 2014-01-21 DIAGNOSIS — I10 Essential (primary) hypertension: Secondary | ICD-10-CM

## 2014-01-21 MED ORDER — FUROSEMIDE 20 MG PO TABS
ORAL_TABLET | ORAL | Status: DC
Start: 2014-01-21 — End: 2014-08-10

## 2014-01-21 MED ORDER — CARVEDILOL 3.125 MG PO TABS: 3.1250 mg | ORAL_TABLET | Freq: Two times a day (BID) | ORAL | Status: DC

## 2014-01-21 NOTE — Patient Instructions (Signed)
Your physician recommends that you schedule a follow-up appointment in:  2-3 weeks   Your physician has recommended you make the following change in your medication:   START Coreg 3.125 mg twice a day.

## 2014-01-21 NOTE — Progress Notes (Signed)
HPI: This is a 80 year old female patient seen by Dr. Wyline Mood on 01/03/14. She had an episode of pulmonary edema during admission for hip surgery after receiving blood and IV fluids. She has normal LV function and mild AI. She does have moderate to severe pulmonary hypertension. She also has chest pain with no evidence of ACS by prior EKGs and enzymes. Given her advanced age aggressive evaluation it is not indicated at this time. Because of palpitations with her chest pain and shortness of breath a 48-hour Holter was ordered. Should be noted that ER notes reported atrial fib on telemetry. 48 hour Holter did show brief salvos of atrial fibrillation. She had one episode of SVT heart rate of 178 beats per minute. Predominantly she had sinus rhythm and sinus tachycardia and occasional PVCs and frequent PACs. No diary was returned.  Patient denies any complaints. She is currently living [facility] and is hoping to go home. She has had no further chest pain. She denies any palpitations, dyspnea, dyspnea on exertion, dizziness or presyncope. She was started on Lasix 20 mg every other day.  No Known Allergies  Current Outpatient Prescriptions on File Prior to Visit: ALPRAZolam (XANAX) 0.5 MG tablet, Take one tablet by mouth at bedtime to help rest, Disp: 30 tablet, Rfl: 5 alum & mag hydroxide-simeth (MAALOX/MYLANTA) 200-200-20 MG/5ML suspension, Take 30 mLs by mouth 2 (two) times daily as needed for indigestion., Disp: , Rfl:  aspirin EC 325 MG EC tablet, Take 1 tablet (325 mg total) by mouth daily with breakfast., Disp: 30 tablet, Rfl: 0 calcium carbonate (OS-CAL) 600 MG TABS, Take 1,200 mg by mouth daily with breakfast. , Disp: , Rfl:  cholecalciferol (VITAMIN D) 1000 UNITS tablet, Take 5,000 Units by mouth once a week. On Fridays. Confirmed by family member, Disp: , Rfl:  docusate sodium (COLACE) 100 MG capsule, Take 1 capsule (100 mg total) by mouth 2 (two) times daily. Continue this while taking narcotics to  help with bowel movements, Disp: 30 capsule, Rfl: 1 feeding supplement (GLUCERNA SHAKE) LIQD, Take 237 mLs by mouth 3 (three) times daily between meals. , Disp: , Rfl:  ferrous sulfate 325 (65 FE) MG tablet, Take 1 tablet (325 mg total) by mouth 2 (two) times daily with a meal., Disp: , Rfl: 3 Multiple Vitamin (MULTIVITAMIN WITH MINERALS) TABS, Take 1 tablet by mouth daily., Disp: , Rfl:  nitroGLYCERIN (NITROSTAT) 0.4 MG SL tablet, Place 0.4 mg under the tongue every 5 (five) minutes as needed for chest pain., Disp: , Rfl:  omeprazole (PRILOSEC) 40 MG capsule, Take 40 mg by mouth 2 (two) times daily., Disp: , Rfl:  sennosides-docusate sodium (SENOKOT-S) 8.6-50 MG tablet, Take 1 tablet by mouth daily as needed for constipation., Disp: , Rfl:  traMADol (ULTRAM) 50 MG tablet, Take one to two tablets by mouth every 6 hours as needed for moderate to severe pain, Disp: 240 tablet, Rfl: 5  No current facility-administered medications on file prior to visit.   Past Medical History:   Fall                                                           Comment:secondary to weakness as of  [place] OV  03/26/10   H/O orthostatic hypotension  Chest discomfort                                               Comment:anterior   Hypertension                                                 Aortic valve stenosis                                          Comment:mild to moderate   Syncope and collapse                            2006         Arthritis                                                    GERD (gastroesophageal reflux disease)                       Osteoporosis                                                 Abnormal EKG                                                 Scarlet fever                                                Scoliosis                                                    TIA (transient ischemic attack)                 1999        Past Surgical History:    CHOLECYSTECTOMY                                               CATARACT EXTRACTION                             Bilateral              FEMUR IM NAIL  Right 12/17/2013      Comment:Procedure: INTRAMEDULLARY (IM) NAIL FEMORAL;                Surgeon: Sheral Apley, MD;  Location: [facility]               OR;  Service: Orthopedics;  Laterality: Right;  Review of patient's family history indicates:   Pneumonia                      Mother                   Heart failure                  Father                   Diabetes                       Father                   Heart disease                  Sister                   Social History   Marital Status: Widowed             Spouse Name:                      Years of Education:                 Number of children:             Occupational History   None on file  Social History Main Topics   Smoking Status: Former Smoker                   Packs/Day: 0.00  Years:           Quit date: 01/16/1981   Smokeless Status: Never Used                       Alcohol Use: No             Drug Use: No             Sexual Activity: Not on file        Other Topics            Concern   None on file  Social History Narrative   None on file    ROS: See history of present illness otherwise negative   PHYSICAL EXAM: Well-nournished, in no acute distress. Neck: No JVD, HJR, Bruit, or thyroid enlargement  Lungs: No tachypnea, clear without wheezing, rales, or rhonchi  Cardiovascular: RRR, PMI not displaced, heart sounds normal, no murmurs, gallops, bruit, thrill, or heave.  Abdomen: BS normal. Soft without organomegaly, masses, lesions or tenderness.  Extremities: without cyanosis, clubbing or edema. Good distal pulses bilateral  SKin: Warm, no lesions or rashes   Musculoskeletal: No deformities  Neuro: no focal signs  BP 138/60  Pulse 88  Ht 5\' 1"  (1.549 m)  Wt 96 lb (43.545 kg)  BMI 18.15 kg/m2  SpO2  93%

## 2014-01-21 NOTE — Assessment & Plan Note (Signed)
Patient's Holter monitor revealed predominantly sinus rhythm and sinus tachycardia with occasional PVCs and frequent PACs. She had previous elbows of atrial fibrillation and one episode of SVT heart rate of 178 beats per minute. I will start Coreg 3.125 mg twice a day. She will see Dr.Branch back to titrate up. He will also review her Holter. I discussed this with Dr. Tenny Crawoss who did not feel like we should start anticoagulation at this point. Await Dr. Verna CzechBranch's review.

## 2014-01-21 NOTE — Assessment & Plan Note (Signed)
Blood pressure is stable 

## 2014-01-21 NOTE — Assessment & Plan Note (Signed)
Check renal function on Lasix

## 2014-01-21 NOTE — Assessment & Plan Note (Signed)
Heart failure is controlled on Lasix 20 mg every other day. She is well compensated today. Normal LV function.

## 2014-01-30 ENCOUNTER — Non-Acute Institutional Stay (SKILLED_NURSING_FACILITY): Payer: Medicare Other | Admitting: Internal Medicine

## 2014-01-30 DIAGNOSIS — I5030 Unspecified diastolic (congestive) heart failure: Secondary | ICD-10-CM

## 2014-01-30 DIAGNOSIS — I509 Heart failure, unspecified: Secondary | ICD-10-CM

## 2014-01-30 DIAGNOSIS — M545 Low back pain, unspecified: Secondary | ICD-10-CM

## 2014-01-30 DIAGNOSIS — M549 Dorsalgia, unspecified: Secondary | ICD-10-CM | POA: Insufficient documentation

## 2014-01-30 DIAGNOSIS — I10 Essential (primary) hypertension: Secondary | ICD-10-CM

## 2014-01-30 HISTORY — DX: Unspecified diastolic (congestive) heart failure: I50.30

## 2014-01-30 NOTE — Progress Notes (Signed)
Patient ID: Briana Schultz, female   DOB: 11/26/1912, 80 y.o.   MRN: [id number]   this is an acute visit.  Level of care skilled.  [facility]  Chief complaint-acute visit secondary to hypertension-followup CHF.  History of present illness.  Patient is a very pleasant 80 year old female came here after sustaining a hip fracture-shortly thereafter she developed chest pain shortness of breath and hypoxia-he required hospital readmission and was observed-she returned to the facility has been doing reasonably well-she does have a history of diastolic CHF she recently saw cardiology per their note she appears to be stable in this regard.  She was restarted on her beta blocker low-dose 3.125 mg of Coreg-she does have a history of SVT brief atrial fibrillation.  Nursing staff as well as some elevated blood pressures I got 170/70 today manually I see listed blood pressure is 173/56-161/67-184/63.  According to her daughter was in the room she gets a little dizzy when her blood pressures rise-- -does not complaining of any chest pain or shortness of breath.  It appears during recent hospitalization she did have issues with hypotension and her numerous blood pressure medicines were discontinued including her Coreg Norvasc and Diovan/HCTZ  Family medical social history as been reviewed her previous notes including 01/04/2014 cardiology no 01/21/2014.  Medications have been reviewed per MAR.  Review of systems.  In general does not complaining of fever chills.  Respiratory no complaints of shortness of breath or cough.  Cardiac does not complaining of chest pain does not really appear to have significant lower extremity edema.  GI no complaints of abdominal discomfort nausea or vomiting diarrhea or constipation.  Muscle skeletal does complain at times of some left lower back pain apparently this is chronic and more in the morning when she is repositioned in her chair.  Neurologic does not  complaining of dizziness headache or numbness.  Psych she continues to be bright alert interactive.  Physical exam.  Temperature is 97.7 pulse 80 respirations 16 blood pressure taken manually 170/70 O2 saturations have been in the 90s weight is 99.7 this has been stable for the past 10 days her weight about a month ago was 94.3.  In general this is a frail elderly female in no distress sitting comfortably in her chair.  Her skin is warm and dry.  Chest is clear to auscultation with somewhat reduced breath sounds at the bases she is aphonic.  Heart is regular rate and rhythm although distant heart sounds occasional irregular beat that she does not really have significant lower extremity edema or sacral edema.  Abdomen soft nontender positive bowel sounds.  Muscle skeletal does have some lower extremity weakness but does not have an acute pain with flexion or extension at the hip-left lower back was kyphotic  but did not really note any deformity here  Labs.  01/24/2014.  Sodium 144 potassium 3.7 BUN 17 creatinine 0.64.  01/07/2014.  WBC 5.3 hemoglobin 10.1 platelets 167.  Liver function tests within normal limits is up alkaline phosphatase 144 albumin of 2.7.  Assessment and plan.  #1-hypertension-blood pressures appear to be somewhat consistently elevated-I did discuss this with her daughter in the room-we'll restart her Norvasc 5 mg a day and monitor blood pressures and pulses every shift clinically she appears stable.  #2-history of diastolic CHF with a history of a pleural effusion in the past-this appears stable clinically she has seen cardiology as well who felt she was stable in this regard.  #3-history of back  pain intermittent- will obtain an x-ray to rule out any acute process.  [id number]

## 2014-01-31 ENCOUNTER — Ambulatory Visit (HOSPITAL_COMMUNITY)
Admission: RE | Admit: 2014-01-31 | Discharge: 2014-01-31 | Disposition: A | Discharge status: Home / Self Care | Payer: Medicare Other | Source: Ambulatory Visit | Attending: Internal Medicine | Admitting: Internal Medicine

## 2014-01-31 ENCOUNTER — Other Ambulatory Visit (HOSPITAL_BASED_OUTPATIENT_CLINIC_OR_DEPARTMENT_OTHER): Payer: Self-pay | Admitting: Internal Medicine

## 2014-01-31 DIAGNOSIS — M949 Disorder of cartilage, unspecified: Principal | ICD-10-CM

## 2014-01-31 DIAGNOSIS — M47817 Spondylosis without myelopathy or radiculopathy, lumbosacral region: Secondary | ICD-10-CM | POA: Insufficient documentation

## 2014-01-31 DIAGNOSIS — M545 Low back pain: Secondary | ICD-10-CM

## 2014-01-31 DIAGNOSIS — M899 Disorder of bone, unspecified: Secondary | ICD-10-CM | POA: Insufficient documentation

## 2014-01-31 DIAGNOSIS — M439 Deforming dorsopathy, unspecified: Secondary | ICD-10-CM | POA: Insufficient documentation

## 2014-02-01 ENCOUNTER — Non-Acute Institutional Stay (SKILLED_NURSING_FACILITY): Payer: Medicare Other | Admitting: Internal Medicine

## 2014-02-01 DIAGNOSIS — M545 Low back pain, unspecified: Secondary | ICD-10-CM

## 2014-02-01 DIAGNOSIS — I10 Essential (primary) hypertension: Secondary | ICD-10-CM

## 2014-02-01 NOTE — Progress Notes (Signed)
Patient ID: Briana Schultz, female   DOB: 04/08/1913, 80 y.o.   MRN: [id number]   . This is an acute visit.  Level of care skilled.  [facility]  Chief complaint-acute visit followup back pain-hypertension.  History of present illness.  Patient is a very pleasant 80 year old female here for rehabilitation after sustaining a right hip fracture that was surgically repaired.  She has complained of some chronic lower back pain-x-ray was ordered which showed osteopenia with moderate to severe L2 compression deformity that has been new since previous x-ray on 01/06/2012.  Also showed spinal curvature and spondylosis  She is receiving tramadol when necessary every 6 hours -- she states this is helping as well as Tylenol   He also has a history tension with some fairly significantly elevated systolics at times in the 180s-she was started on Norvasc 5 mg earlier this week I see listed blood pressure in the 180s systolically-however I took it and got 154/70 manually.-- is on low-dose Coreg 3.125 mg twice a day as well  She does not complaining of any headache or dizziness.  Family medical social history as been reviewed her previous progress notes including 01/14/2014 as well as 01/06/2014.  [facility]    Review of systems.  Gen. no complaints of fever or chills.  Respiratory does not complain of shortness of breath or cough.  Cardiac history of tachycardia but this appears to be better controlled she is now on Coreg-  GI does not complaining of any abdominal discomfort nausea or vomiting.  Muscle skeletal as low back pain but she says this actually appears controlled recently on the tramadol and has not worsened precipitously she said in the last week or 2-.  Neurologic does not complaining of any numbness or sciatica type symptoms.  Physical exam.  Temperature is 97.4 pulse 85 respirations 20 blood pressure 154/70.  General this is a pleasant  elderly female in no distress sitting comfortably in a wheelchair.  Her skin is warm and dry.  Chest is clear to auscultation no labored breathing.  Heart is regular rate and rhythm without murmur gallop or rub she has some mild lower extremity edema.  Back she continues with curvature of her spine there is slight tenderness to palpation of the lumbar area-she says she feels the pain more with repositioning when she gets in a chair-however says the pain is tolerable and the pain medicine is helping  Labs.  01/24/2014.  Sodium 144 potassium 3.7 BUN 17 creatinine 0.64.  01/07/2014.  WBC 5.3 hemoglobin 10.1 platelets 167.  Assessment plan.  #1-low back pain-with history of lumbar compression deformity-at this point patient says current pain medication tramadol and apparently Tylenol when necessary is helping at this point will monitor I suspect she would not be a great candidate for aggressive intervention unless symptoms worsen she denies any sciatica.  #2-hypertension she has been started on Norvasc also on low-dose Coreg at this point will continue to monitor with blood pressure and pulse every shift.  Appears to have some variable systolics I suspect these were taken by machine-manually was 154/70 today she may need a titration up of her meds but would like to get more readings  [id number]

## 2014-02-04 ENCOUNTER — Encounter: Payer: Self-pay | Admitting: Cardiology

## 2014-02-04 ENCOUNTER — Ambulatory Visit (HOSPITAL_COMMUNITY)
Admission: RE | Admit: 2014-02-04 | Discharge: 2014-02-04 | Disposition: A | Discharge status: Home / Self Care | Payer: Medicare Other | Source: Ambulatory Visit | Attending: Internal Medicine | Admitting: Internal Medicine

## 2014-02-04 ENCOUNTER — Other Ambulatory Visit: Payer: Self-pay | Admitting: Internal Medicine

## 2014-02-04 ENCOUNTER — Ambulatory Visit (INDEPENDENT_AMBULATORY_CARE_PROVIDER_SITE_OTHER): Payer: Medicare Other | Admitting: Cardiology

## 2014-02-04 VITALS — BP 142/60 | HR 104 | Ht 60.0 in | Wt 102.0 lb

## 2014-02-04 DIAGNOSIS — T07XXXA Unspecified multiple injuries, initial encounter: Secondary | ICD-10-CM | POA: Diagnosis not present

## 2014-02-04 DIAGNOSIS — M25429 Effusion, unspecified elbow: Secondary | ICD-10-CM | POA: Insufficient documentation

## 2014-02-04 DIAGNOSIS — M25559 Pain in unspecified hip: Secondary | ICD-10-CM | POA: Insufficient documentation

## 2014-02-04 DIAGNOSIS — W19XXXA Unspecified fall, initial encounter: Secondary | ICD-10-CM | POA: Insufficient documentation

## 2014-02-04 DIAGNOSIS — R937 Abnormal findings on diagnostic imaging of other parts of musculoskeletal system: Secondary | ICD-10-CM | POA: Diagnosis not present

## 2014-02-04 DIAGNOSIS — M25529 Pain in unspecified elbow: Secondary | ICD-10-CM | POA: Diagnosis present

## 2014-02-04 DIAGNOSIS — I4891 Unspecified atrial fibrillation: Secondary | ICD-10-CM

## 2014-02-04 DIAGNOSIS — I10 Essential (primary) hypertension: Secondary | ICD-10-CM

## 2014-02-04 MED ORDER — CARVEDILOL 3.125 MG PO TABS: 3.1250 mg | ORAL_TABLET | Freq: Two times a day (BID) | ORAL | Status: AC

## 2014-02-04 NOTE — Patient Instructions (Signed)
Your physician recommends that you schedule a follow-up appointment in: 4 months     Your physician has recommended you make the following change in your medication:    START Coreg 3.125 mg twice a day      Thank you for choosing [facility] !

## 2014-02-04 NOTE — Progress Notes (Signed)
Clinical Summary Briana Schultz is a 80 y.o.female seen today for follow up of the following medical problems  1. Falls - recent admit 12/17/13 after fall at home. No reported LOC, apparent mechanical fall.  - xrays showed right femur fracture  - problems with anemia after surgery, transfused 2 units pRBCs. Started on po iron at discharge  - s/p repair by ortho, discharged to rehab 12/22/13.   - repeat fall today while trying to get off commode. Family reports she is to get xrays.   2. Pulmonary edema  - developed during recent admission with hip fracture, from notes she received significant fluid volumes from blood products and IVF.  - patient became hypoxic, acutely volume overloaded. Diuresed 5.4 liters  - echo 12/20/13 LVEF 60%, PASP 65. Diastolic function is not described with no reported parameters. There was mild AI.  - denies any recent SOB  3. HTN - low blood pressures during recent admission, bp meds held at discharge - bp increasing, agents have been readded at the [facility] center  4. Afib - noted during recent ER visit, however EKGs and strips were not available for confirmation - recent monitor showed episodes of afib confirming diagnosis, - started on coreg last visit, not started on anticoag due to fall risk - denies any palpitations  Past Medical History  Diagnosis Date  . Fall     secondary to weakness as of  [place]  03/26/10  . H/O orthostatic hypotension   . Chest discomfort     anterior  . Hypertension   . Aortic valve stenosis     mild to moderate  . Syncope and collapse 2006  . Arthritis   . GERD (gastroesophageal reflux disease)   . Osteoporosis   . Abnormal EKG   . Scarlet fever   . Scoliosis   . TIA (transient ischemic attack) 1999     No Known Allergies   Current Outpatient Prescriptions  Medication Sig Dispense Refill  . ALPRAZolam (XANAX) 0.5 MG tablet Take one tablet by mouth at bedtime to help rest  30 tablet  5  . alum & mag  hydroxide-simeth (MAALOX/MYLANTA) 200-200-20 MG/5ML suspension Take 30 mLs by mouth 2 (two) times daily as needed for indigestion.      Marland Kitchen amLODipine (NORVASC) 5 MG tablet Take 5 mg by mouth daily.      Marland Kitchen aspirin EC 325 MG EC tablet Take 1 tablet (325 mg total) by mouth daily with breakfast.  30 tablet  0  . calcium carbonate (OS-CAL) 600 MG TABS Take 1,200 mg by mouth daily with breakfast.       . cholecalciferol (VITAMIN D) 1000 UNITS tablet Take 5,000 Units by mouth once a week. On Fridays. Confirmed by family member      . docusate sodium (COLACE) 100 MG capsule Take 1 capsule (100 mg total) by mouth 2 (two) times daily. Continue this while taking narcotics to help with bowel movements  30 capsule  1  . esomeprazole (NEXIUM) 40 MG capsule Take 40 mg by mouth 2 (two) times daily before a meal.      . feeding supplement (GLUCERNA SHAKE) LIQD Take 237 mLs by mouth 3 (three) times daily between meals.       . ferrous sulfate 325 (65 FE) MG tablet Take 1 tablet (325 mg total) by mouth 2 (two) times daily with a meal.    3  . furosemide (LASIX) 20 MG tablet Take 20 mg every other day  90 tablet  3  . Multiple Vitamin (MULTIVITAMIN WITH MINERALS) TABS Take 1 tablet by mouth daily.      . nitroGLYCERIN (NITROSTAT) 0.4 MG SL tablet Place 0.4 mg under the tongue every 5 (five) minutes as needed for chest pain.      Marland Kitchen sennosides-docusate sodium (SENOKOT-S) 8.6-50 MG tablet Take 1 tablet by mouth daily as needed for constipation.       No current facility-administered medications for this visit.     Past Surgical History  Procedure Laterality Date  . Cholecystectomy    . Cataract extraction Bilateral   . Femur im nail Right 12/17/2013    Procedure: INTRAMEDULLARY (IM) NAIL FEMORAL;  Surgeon: Sheral Apley, MD;  Location: [facility] OR;  Service: Orthopedics;  Laterality: Right;     No Known Allergies    Family History  Problem Relation Age of Onset  . Pneumonia Mother   . Heart failure Father   .  Diabetes Father   . Heart disease Sister      Social History Ms. Errickson reports that she quit smoking about 33 years ago. She has never used smokeless tobacco. Ms. Glace reports that she does not drink alcohol.   Review of Systems CONSTITUTIONAL: No weight loss, fever, chills, weakness or fatigue.  HEENT: Eyes: No visual loss, blurred vision, double vision or yellow sclerae.No hearing loss, sneezing, congestion, runny nose or sore throat.  SKIN: No rash or itching.  CARDIOVASCULAR: per HPI RESPIRATORY: No shortness of breath, cough or sputum.  GASTROINTESTINAL: No anorexia, nausea, vomiting or diarrhea. No abdominal pain or blood.  GENITOURINARY: No burning on urination, no polyuria NEUROLOGICAL: No headache, dizziness, syncope, paralysis, ataxia, numbness or tingling in the extremities. No change in bowel or bladder control.  MUSCULOSKELETAL: bilateral elbow pain  LYMPHATICS: No enlarged nodes. No history of splenectomy.  PSYCHIATRIC: No history of depression or anxiety.  ENDOCRINOLOGIC: No reports of sweating, cold or heat intolerance. No polyuria or polydipsia.  Marland Kitchen   Physical Examination p 104 bp 142/60 Wt 102 lbs BMI 20 Gen: resting comfortably, no acute distress HEENT: no scleral icterus, pupils equal round and reactive, no palptable cervical adenopathy,  CV: RRR, 2/6 systolic murmur at apex, no JVD, no carotid bruits Resp: Clear to auscultation bilaterally GI: abdomen is soft, non-tender, non-distended, normal bowel sounds, no hepatosplenomegaly MSK: extremities are warm, no edema.  Skin: warm, no rash Neuro:  no focal deficits Psych: appropriate affect   Diagnostic Studies 01/02/14 EKG  Sinus, incomplete RBBB with LAFB   12/20/13 Echo Study Conclusions  - Left ventricle: Technically difficult study. The cavity size was normal. Wall thickness was increased in a pattern of mild LVH. The estimated ejection fraction was 60%. Regional wall motion abnormalities cannot  be excluded. - Aortic valve: Sclerosis without stenosis. There was mild regurgitation. - Left atrium: The atrium was mildly dilated. - Pulmonary arteries: PA peak pressure: 65 mm Hg (S).      Assessment and Plan  1. Afib - fairly new diagnosis, no current symptoms - on coreg but does appear on her nursing home med list, will reorder - not on anticoag, very high fall risk. Fall with hip fracture 12/2013, repeat fall just this morning prior to coming to our clinic. Continue ASA.   2. Pulmonary edema  - patient with episode of pulm edema during admission for hip surgery after receiving blood and IVF. Normal LVEF, mild AI. Diasotlic function not described, she did have moderate to severe pulm HTN. This occurred  in the setting of volume overload and much of that could have been related to elevated LA pressures   3. HTN - bp agents held after recent discharge in setting of low blood pressures - blood pressures increasing, continue coreg and norvasc. Further titration per rehab center as they are checking her bp regularly.     F/u 3-4 months, if patient doing well may push further back   Antoine PocheJonathan F. Nayelis Bonito, M.D., F.A.C.C.

## 2014-02-05 ENCOUNTER — Telehealth: Payer: Self-pay | Admitting: Orthopedic Surgery

## 2014-02-05 NOTE — Telephone Encounter (Signed)
Correction to Note entered and answered by Dr Romeo AppleHarrison 02/05/14:  Problem that patient's family is requesting appointment for is: LEFT ELBOW.  [facility] on elbow, and also on hips.  It is correct that patient had recent hip surgery 12/17/13 by [place]opaedist in [place]. Is the answer to see the current orthopaedist?  Please advise  Zollie ScaleOlivia at [facility] - Ph# [phone] - again requesting appointment for this resident,for the elbow, status/post fall 02/04/14.

## 2014-02-05 NOTE — Telephone Encounter (Signed)
i want her to be seen by the other doctor first

## 2014-02-05 NOTE — Telephone Encounter (Signed)
[facility] - Ph# [phone] - requesting appointment for this resident, "new patient", for problem of possible new fracture of right hip, status post fall 02/04/14.  [facility] was [facility]; copies are also in Dr. Mort SawyersHarrison's box, received by fax. [facility] also relates that patient had recent hip fracture surgery, 12/17/13, by [place]opaedic [place] in [place], [facility].  States that patient's granddaughter, who "knows Dr Romeo AppleHarrison as a patient and through church" requests appointment in our office if possible.  Please review and please advise.

## 2014-02-06 ENCOUNTER — Non-Acute Institutional Stay (SKILLED_NURSING_FACILITY): Payer: Medicare Other | Admitting: Internal Medicine

## 2014-02-06 DIAGNOSIS — G4700 Insomnia, unspecified: Secondary | ICD-10-CM

## 2014-02-06 NOTE — Telephone Encounter (Signed)
Per Zollie Scalelivia from [facility], disregard request - family scheduling appointment [place] Dr Eulah PontMurphy.

## 2014-02-07 NOTE — Telephone Encounter (Signed)
Our next appointment is week of aug 24th

## 2014-02-07 NOTE — Telephone Encounter (Signed)
[facility] back to [facility] [place], notified, in event patient's family still wishes to schedule.

## 2014-02-08 DIAGNOSIS — G4700 Insomnia, unspecified: Secondary | ICD-10-CM | POA: Insufficient documentation

## 2014-02-08 NOTE — Progress Notes (Signed)
Patient ID: Briana Schultz, female   DOB: 01/27/1913, 80 y.o.   MRN: [id number]           PROGRESS NOTE  DATE: 02/06/2014          FACILITY:  [facility]  LEVEL OF CARE: SNF (31)  Acute Visit  CHIEF COMPLAINT:  Manage insomnia.    HISTORY OF PRESENT ILLNESS: I was requested by the staff to assess the patient regarding above problem(s):  INSOMNIA:  I was requested by the pharmacy consultant to assess the patient for a possible dose reduction of her Xanax.  Per daughter, the patient benefits from the Xanax for sleep and she does not want the medication changed.  The insomnia remains stable.  No complications noted from the medications presently being used. Patient denies ongoing insomnia, pain, hallucinations, delusions.     PAST MEDICAL HISTORY : Reviewed.  No changes/see problem list  CURRENT MEDICATIONS: Reviewed per MAR/see medication list  PHYSICAL EXAMINATION  VS: see VS section  GENERAL: no acute distress, thin body habitus RESPIRATORY: breathing is even & unlabored, BS CTAB CARDIAC: RRR, no murmur,no extra heart sounds, no edema PSYCHIATRIC: the patient is alert & oriented to person, affect & behavior appropriate  ASSESSMENT/PLAN:  Insomnia.  Daughter does not want the Xanax dose reduced since it is beneficial to her.    CPT CODE: [id number]           Angela CoxGayani Y Dasanayaka, MD [facility] [phone]

## 2014-02-11 NOTE — Telephone Encounter (Signed)
Did not hear back from [name], [facility] in response to message.

## 2014-02-13 ENCOUNTER — Non-Acute Institutional Stay (SKILLED_NURSING_FACILITY): Payer: Medicare Other | Admitting: Internal Medicine

## 2014-02-13 ENCOUNTER — Encounter: Payer: Self-pay | Admitting: Internal Medicine

## 2014-02-13 DIAGNOSIS — IMO0002 Reserved for concepts with insufficient information to code with codable children: Secondary | ICD-10-CM

## 2014-02-13 DIAGNOSIS — S72009D Fracture of unspecified part of neck of unspecified femur, subsequent encounter for closed fracture with routine healing: Secondary | ICD-10-CM

## 2014-02-13 DIAGNOSIS — I509 Heart failure, unspecified: Secondary | ICD-10-CM

## 2014-02-13 DIAGNOSIS — M1710 Unilateral primary osteoarthritis, unspecified knee: Secondary | ICD-10-CM

## 2014-02-13 DIAGNOSIS — I10 Essential (primary) hypertension: Secondary | ICD-10-CM

## 2014-02-13 DIAGNOSIS — I5030 Unspecified diastolic (congestive) heart failure: Secondary | ICD-10-CM

## 2014-02-13 DIAGNOSIS — S72001D Fracture of unspecified part of neck of right femur, subsequent encounter for closed fracture with routine healing: Secondary | ICD-10-CM

## 2014-02-13 NOTE — Progress Notes (Signed)
Patient ID: Briana Schultz, female   DOB: 07-Feb-1913, 80 y.o.   MRN: [id number]   Facility [facility] Level of care-skilled  This is a discharge note.  .  Chief complaint-discharge note  . History of present illness.  Patient is a very pleasant 80 year old female with a history of hypertension and mild dementia who had lived independently but had a fall in her home and sustained a right hip fracture.  Her fracture was surgically repaired with an IM nailing in interlocked screw-he is on aspirin for DVT prophylaxis.  She got confused with Vicodin pain management and this was switched to tramadol when necessary she appears to be tolerating this well --he had complaint of some low back pain-an x-ray did show a compression deformity in the lumbar spine-I suspect she would be a poor candidate for aggressive treatment-apparently the pain medication is helping for now.  Postop she did have anemia and required transfusion of 2 units hemoglobin dropped initially to 6.5 on discharge was 8.2. --This has improved to 10.1 on labs done July 6 She is on iron.  she also had pulmonary edema in IV fluids-she was diuresed for 2 days and apparently this resolved unremarkably.--This was thought possibly to be caused by the blood transfusion or IV fluids  Her blood pressure was low in the hospital thought possibly to be blood loss-her beta blocker Norvasc and valsartan hydrochlorothiazide apparently were held-all her blood pressures were somewhat elevated here and she has been started back on Norvasc she is also on low-dose Coreg--  Patient shortly after her initial admission developed chest pain shortness of breath with hypoxia she was readmitted to the hospital here for observation-a CT scan did not show pulmonary embolism-he does have a history of coronary artery disease again conservative treatment was pursued   Currently she appears to be doing relatively well he still working with therapy and would continue with  therapy at home-she certainly appears to need this with her significant weakness-also would benefit from a platform walker with help with ambulation and significant weakness and fall risk.  She will be going home with her daughter who is very supportive    Previous medical history.  History of femur fracture status post repair.  Hypertension with hypotension in the hospital.  Anemia thought to be blood loss from surgery.  Protein calorie malnutrition.  Pulmonary edema from fluid overload.  Aortic valve stenosis.  Arthritis.  GERD.  Osteoporosis.  Apparently previous history of scarlet fever.  History of scoliosis.   Social history-again patient did live independently she is a widow that she has a previous smoking history but quit years ago denies any alcohol use.  .  Family history-father with a history of MI mother with a history of goiter brother with a history of MI sister has colon cancer .  Medications.  Aspirin 325 mg a day.  Calcium carbonate 600 mg 2 tablets with breakfast.  Multivitamin daily.  Vitamin D 1000 units daily.   Vitamin C 50 mg daily for 28 days.  Xanax 0.5 mg each bedtime.  Nexium 40 mg twice a day.  Tylenol6 50 mg every46 hours when necessary.  Colace 100 mg twice a day.  Ferrous sulfate 325 mg twice a day  Lasix 20 mg daily.  Tramadol 50 mg 2 tabs for moderate or severe pain every 6 hours when necessary.  Give 1 For mild-to-moderate pain every 6 hours when necessary.  Norvasc 5 mg by mouth daily.  Coreg 3.25 mg twice a  day.    Review of systems.  General no complaints of fever or chills.  Skin does not complaining of any rash or itching.  Head ears eyes nose mouth and throat does not complaining of visual changes she does have prescription lenses does not complaining of nasal discharge  Respiratory-does not complaining of shortness of breath or cough.  Cardiac is not complaining of chest pain has not really had significant edema.  GI does not  complaining of any abdominal discomfort nausea or vomiting or constipation.  GU does not complain of dysuria.  Muscle skeletal --at this point does not complaining of pain has complained of back pain and some hip discomfort in the past-does complain of some mild right hip discomfort but says this has not gotten any worse   Neurologic does not complaining of a headache dizziness or syncopal-type feelings.  Psych-does have a history of mild dementia.   Physical exam.    Temperature 97.9 pulse 84 respirations 20 blood pressure taken manually 168/62-see previous readings 172/73-152/66-127/61 is the lowest--weight is stable at 96 pounds In general this is a frail elderly female in no distress sitting comfortably in a wheelchair.  Her skin is warm and dry surgical site right hip appears well healed  Eyes she has prescription lenses pupils appear reactive to light visual acuity appears grossly intact.  Oropharynx is clear mucous membranes moist.  Chest is clear to auscultation with somewhat shallow air entry there is no labored breathing.  Heart is regular rate and rhythm without murmur gallop or rub -- there is not really significant lower extremity edema.  Abdomen is soft somewhat protuberant nontender with positive bowel sounds.  Muscle skeletal -is able to stand without assistance but is very weak ambulation was limited certainly  Neurologic-is grossly intact her speech is clear --no lateralizing findings.  Psych she is oriented to self pleasant and appropriate    Labs. 01/24/2014.  Sodium 144 potassium 3.7 BUN 17 creatinine 0.64  01/07/2014.  WBC 5.3-hemoglobin 10.1-platelets 167.  Liver function tests within normal limits except alkaline phosphatase 144-albumin of 2.7.   01/02/2014 WBC 8.1 hemoglobin 10.8 platelets 221.    12/26/2013.  WBC 8.3 hemoglobin 9.7 platelets 256.  Sodium 142 potassium 4.4 BUN 27 creatinine 0.69.  He  12/22/2013.  Sodium 140 potassium 4.6 BUN 27  creatinine 0.76.  12/17/2013.  Liver function tests within normal limits except albumin of 3.4.   Assessment and plan.   #1 history of right hip fracture with repair-patient appears to be doing reasonably well with this she is on aspirin for anticoagulation is working with therapy- pain appears to be controlled with the tramadol --- still has significant weakness and will need continued PT OT and nursing support for multiple medical issues-also would benefit from a platform walker secondary to her weakness and fall risk --per Ortho limited weightbearing with her lower extremity s  #2-history of postop anemia-this appears to have improved with hemoglobin most recently about 10-we'll update this before discharge   #3- history of pulmonary edema this was thought secondary to transfusion or IV fluids in the hospialt-clinically she appears stable I do not see significant edema continue to monitor there are no complaints of shortness of breath--she is on low dose Lasix we'll update metabolic panel before discharge her weights have been stable.  #4-history of hypertension again she has numerous medications held secondary to low blood pressures in the hospital --appears she has some elevations-see recent blood pressures listed as 172/73--152/66--150/69--the lowest one I  see is 127/61--taken manually today systolic was in the high 160s-appears to have frequent ones however in the 160s range systolically--she is on Coreg 3.125 mg twice a day as well as Norvasc 5 mg a day-we'll increase her Norvasc to 10 mg a day-this will have to be followed by PCP and cardiology-family does check her blood pressures at home which is reassuring .  #6-history of GERD she continues on PPI  #7 dementia-at this point this appears to be mild/moderate --   #8-history of osteoporosis she is on calcium supplementation.  #9-history o  f arthritis-she does have some history of back pain as well apparently the tramadol is giving  relief.   #10-history of anxiety insomnia at at bedtime-she is on Xanax at night routinely apparently has tolerated this well-this was assessed by our service last week with recommendation to continue since she benefits from this   Of note she will be going home with her daughter-- has continued weakness and would benefit from PT and OT as well as nursing support for followup of her multiple medical issues-also would benefit from a platform walker again secondary to significant weakness  [id number]-of note greater than 30 minutes spent on this discharge summary

## 2014-02-19 DIAGNOSIS — F0390 Unspecified dementia without behavioral disturbance: Secondary | ICD-10-CM

## 2014-02-19 DIAGNOSIS — M810 Age-related osteoporosis without current pathological fracture: Secondary | ICD-10-CM

## 2014-02-19 DIAGNOSIS — E119 Type 2 diabetes mellitus without complications: Secondary | ICD-10-CM

## 2014-02-19 DIAGNOSIS — S7290XD Unspecified fracture of unspecified femur, subsequent encounter for closed fracture with routine healing: Secondary | ICD-10-CM

## 2014-03-25 ENCOUNTER — Telehealth: Payer: Self-pay | Admitting: *Deleted

## 2014-03-25 NOTE — Telephone Encounter (Signed)
Briana Schultz with [facility] called and stated that they needed orders to continue PT Orders. Called and informed her that this is not our patient that she was D/C from Facility and she will need to call her Primary. Briana Schultz Agreed.

## 2014-08-05 DIAGNOSIS — Z66 Do not resuscitate: Secondary | ICD-10-CM

## 2014-08-05 DIAGNOSIS — Z9981 Dependence on supplemental oxygen: Secondary | ICD-10-CM

## 2014-08-05 HISTORY — DX: Dependence on supplemental oxygen: Z99.81

## 2014-08-05 HISTORY — DX: Do not resuscitate: Z66

## 2014-08-09 ENCOUNTER — Observation Stay (HOSPITAL_BASED_OUTPATIENT_CLINIC_OR_DEPARTMENT_OTHER)
Admission: EM | Admit: 2014-08-09 | Discharge: 2014-08-10 | Disposition: A | Discharge status: Skilled Nursing Facility | Payer: Medicare Other | Source: Home / Self Care | Attending: Emergency Medicine | Admitting: Emergency Medicine

## 2014-08-09 ENCOUNTER — Emergency Department (HOSPITAL_COMMUNITY): Payer: Medicare Other

## 2014-08-09 ENCOUNTER — Encounter (HOSPITAL_COMMUNITY): Payer: Self-pay

## 2014-08-09 DIAGNOSIS — Z7982 Long term (current) use of aspirin: Secondary | ICD-10-CM

## 2014-08-09 DIAGNOSIS — D6959 Other secondary thrombocytopenia: Secondary | ICD-10-CM | POA: Insufficient documentation

## 2014-08-09 DIAGNOSIS — M419 Scoliosis, unspecified: Secondary | ICD-10-CM

## 2014-08-09 DIAGNOSIS — E46 Unspecified protein-calorie malnutrition: Secondary | ICD-10-CM | POA: Insufficient documentation

## 2014-08-09 DIAGNOSIS — I10 Essential (primary) hypertension: Secondary | ICD-10-CM | POA: Insufficient documentation

## 2014-08-09 DIAGNOSIS — R0902 Hypoxemia: Secondary | ICD-10-CM | POA: Diagnosis not present

## 2014-08-09 DIAGNOSIS — S42201A Unspecified fracture of upper end of right humerus, initial encounter for closed fracture: Secondary | ICD-10-CM | POA: Insufficient documentation

## 2014-08-09 DIAGNOSIS — W1789XA Other fall from one level to another, initial encounter: Secondary | ICD-10-CM

## 2014-08-09 DIAGNOSIS — D696 Thrombocytopenia, unspecified: Secondary | ICD-10-CM | POA: Diagnosis not present

## 2014-08-09 DIAGNOSIS — M1990 Unspecified osteoarthritis, unspecified site: Secondary | ICD-10-CM | POA: Insufficient documentation

## 2014-08-09 DIAGNOSIS — I4891 Unspecified atrial fibrillation: Secondary | ICD-10-CM | POA: Insufficient documentation

## 2014-08-09 DIAGNOSIS — I482 Chronic atrial fibrillation, unspecified: Secondary | ICD-10-CM | POA: Diagnosis present

## 2014-08-09 DIAGNOSIS — R531 Weakness: Secondary | ICD-10-CM

## 2014-08-09 DIAGNOSIS — K219 Gastro-esophageal reflux disease without esophagitis: Secondary | ICD-10-CM | POA: Insufficient documentation

## 2014-08-09 DIAGNOSIS — J9601 Acute respiratory failure with hypoxia: Secondary | ICD-10-CM | POA: Diagnosis present

## 2014-08-09 DIAGNOSIS — Y929 Unspecified place or not applicable: Secondary | ICD-10-CM

## 2014-08-09 DIAGNOSIS — D649 Anemia, unspecified: Secondary | ICD-10-CM | POA: Diagnosis present

## 2014-08-09 DIAGNOSIS — S42309A Unspecified fracture of shaft of humerus, unspecified arm, initial encounter for closed fracture: Secondary | ICD-10-CM | POA: Diagnosis present

## 2014-08-09 DIAGNOSIS — S42209A Unspecified fracture of upper end of unspecified humerus, initial encounter for closed fracture: Secondary | ICD-10-CM | POA: Diagnosis present

## 2014-08-09 DIAGNOSIS — S42301D Unspecified fracture of shaft of humerus, right arm, subsequent encounter for fracture with routine healing: Secondary | ICD-10-CM | POA: Diagnosis not present

## 2014-08-09 DIAGNOSIS — Z888 Allergy status to other drugs, medicaments and biological substances status: Secondary | ICD-10-CM | POA: Insufficient documentation

## 2014-08-09 DIAGNOSIS — M25511 Pain in right shoulder: Secondary | ICD-10-CM

## 2014-08-09 DIAGNOSIS — Z880 Allergy status to penicillin: Secondary | ICD-10-CM | POA: Insufficient documentation

## 2014-08-09 DIAGNOSIS — R000 Tachycardia, unspecified: Secondary | ICD-10-CM | POA: Diagnosis present

## 2014-08-09 DIAGNOSIS — I5030 Unspecified diastolic (congestive) heart failure: Secondary | ICD-10-CM | POA: Diagnosis present

## 2014-08-09 DIAGNOSIS — I350 Nonrheumatic aortic (valve) stenosis: Secondary | ICD-10-CM | POA: Insufficient documentation

## 2014-08-09 DIAGNOSIS — M810 Age-related osteoporosis without current pathological fracture: Secondary | ICD-10-CM

## 2014-08-09 DIAGNOSIS — I5032 Chronic diastolic (congestive) heart failure: Secondary | ICD-10-CM

## 2014-08-09 DIAGNOSIS — R3129 Other microscopic hematuria: Secondary | ICD-10-CM | POA: Diagnosis present

## 2014-08-09 DIAGNOSIS — Z79899 Other long term (current) drug therapy: Secondary | ICD-10-CM

## 2014-08-09 DIAGNOSIS — D72829 Elevated white blood cell count, unspecified: Secondary | ICD-10-CM | POA: Insufficient documentation

## 2014-08-09 DIAGNOSIS — J189 Pneumonia, unspecified organism: Secondary | ICD-10-CM | POA: Diagnosis not present

## 2014-08-09 DIAGNOSIS — W19XXXA Unspecified fall, initial encounter: Secondary | ICD-10-CM

## 2014-08-09 HISTORY — DX: Unspecified fracture of shaft of humerus, unspecified arm, initial encounter for closed fracture: S42.309A

## 2014-08-09 HISTORY — DX: Unspecified atrial fibrillation: I48.91

## 2014-08-09 HISTORY — DX: Unspecified diastolic (congestive) heart failure: I50.30

## 2014-08-09 HISTORY — DX: Unspecified fracture of upper end of unspecified humerus, initial encounter for closed fracture: S42.209A

## 2014-08-09 LAB — CBC WITH DIFFERENTIAL/PLATELET
BASOS ABS: 0 10*3/uL (ref 0.0–0.1)
Basophils Relative: 0 % (ref 0–1)
EOS PCT: 0 % (ref 0–5)
Eosinophils Absolute: 0 10*3/uL (ref 0.0–0.7)
HEMATOCRIT: 34.1 % — AB (ref 36.0–46.0)
HEMOGLOBIN: 11 g/dL — AB (ref 12.0–15.0)
Lymphocytes Relative: 6 % — ABNORMAL LOW (ref 12–46)
Lymphs Abs: 0.8 10*3/uL (ref 0.7–4.0)
MCH: 31.6 pg (ref 26.0–34.0)
MCHC: 32.3 g/dL (ref 30.0–36.0)
MCV: 98 fL (ref 78.0–100.0)
Monocytes Absolute: 0.6 10*3/uL (ref 0.1–1.0)
Monocytes Relative: 4 % (ref 3–12)
Neutro Abs: 11.2 10*3/uL — ABNORMAL HIGH (ref 1.7–7.7)
Neutrophils Relative %: 90 % — ABNORMAL HIGH (ref 43–77)
PLATELETS: 162 10*3/uL (ref 150–400)
RBC: 3.48 MIL/uL — ABNORMAL LOW (ref 3.87–5.11)
RDW: 13.9 % (ref 11.5–15.5)
WBC: 12.6 10*3/uL — ABNORMAL HIGH (ref 4.0–10.5)

## 2014-08-09 LAB — TSH: TSH: 1.985 u[IU]/mL (ref 0.350–4.500)

## 2014-08-09 LAB — URINALYSIS, ROUTINE W REFLEX MICROSCOPIC
Bilirubin Urine: NEGATIVE
Glucose, UA: NEGATIVE mg/dL
Ketones, ur: NEGATIVE mg/dL
LEUKOCYTES UA: NEGATIVE
Nitrite: NEGATIVE
PROTEIN: 30 mg/dL — AB
SPECIFIC GRAVITY, URINE: 1.015 (ref 1.005–1.030)
UROBILINOGEN UA: 0.2 mg/dL (ref 0.0–1.0)
pH: 7.5 (ref 5.0–8.0)

## 2014-08-09 LAB — BASIC METABOLIC PANEL
BUN: 16 mg/dL (ref 6–23)
CALCIUM: 8.7 mg/dL (ref 8.4–10.5)
CO2: 36 mmol/L — ABNORMAL HIGH (ref 19–32)
Chloride: 101 mmol/L (ref 96–112)
Creatinine, Ser: 0.56 mg/dL (ref 0.50–1.10)
GFR calc Af Amer: 86 mL/min — ABNORMAL LOW (ref >90)
GFR, EST NON AFRICAN AMERICAN: 75 mL/min — AB (ref >90)
Glucose, Bld: 133 mg/dL — ABNORMAL HIGH (ref 70–99)
Potassium: 3.8 mmol/L (ref 3.5–5.1)
SODIUM: 139 mmol/L (ref 135–145)

## 2014-08-09 LAB — URINE MICROSCOPIC-ADD ON

## 2014-08-09 MED ORDER — SODIUM CHLORIDE 0.9 % IJ SOLN
3.0000 mL | Freq: Two times a day (BID) | INTRAMUSCULAR | Status: DC
  Administered 2014-08-09 – 2014-08-10 (×3): 3 mL via INTRAVENOUS

## 2014-08-09 MED ORDER — ENOXAPARIN SODIUM 30 MG/0.3ML ~~LOC~~ SOLN
30.0000 mg | SUBCUTANEOUS | Status: DC
  Administered 2014-08-09: 30 mg via SUBCUTANEOUS
  Filled 2014-08-09: qty 0.3

## 2014-08-09 MED ORDER — POTASSIUM CHLORIDE IN NACL 20-0.9 MEQ/L-% IV SOLN
INTRAVENOUS | Status: DC
  Administered 2014-08-09 – 2014-08-10 (×2): via INTRAVENOUS

## 2014-08-09 MED ORDER — SODIUM CHLORIDE 0.9 % IJ SOLN: 3.0000 mL | INTRAMUSCULAR | Status: DC | PRN

## 2014-08-09 MED ORDER — PANTOPRAZOLE SODIUM 40 MG PO TBEC
40.0000 mg | DELAYED_RELEASE_TABLET | Freq: Every day | ORAL | Status: DC
  Administered 2014-08-09 – 2014-08-10 (×2): 40 mg via ORAL
  Filled 2014-08-09 (×2): qty 1

## 2014-08-09 MED ORDER — LORATADINE 10 MG PO TABS
10.0000 mg | ORAL_TABLET | Freq: Every day | ORAL | Status: DC
  Administered 2014-08-09 – 2014-08-10 (×2): 10 mg via ORAL
  Filled 2014-08-09 (×2): qty 1

## 2014-08-09 MED ORDER — ACETAMINOPHEN 650 MG RE SUPP: 650.0000 mg | Freq: Four times a day (QID) | RECTAL | Status: DC | PRN

## 2014-08-09 MED ORDER — ALBUTEROL SULFATE (2.5 MG/3ML) 0.083% IN NEBU
2.5000 mg | INHALATION_SOLUTION | Freq: Four times a day (QID) | RESPIRATORY_TRACT | Status: DC
  Administered 2014-08-09 – 2014-08-10 (×3): 2.5 mg via RESPIRATORY_TRACT
  Filled 2014-08-09 (×2): qty 3

## 2014-08-09 MED ORDER — FERROUS SULFATE 325 (65 FE) MG PO TABS
325.0000 mg | ORAL_TABLET | Freq: Two times a day (BID) | ORAL | Status: DC
  Administered 2014-08-09 – 2014-08-10 (×2): 325 mg via ORAL
  Filled 2014-08-09 (×3): qty 1

## 2014-08-09 MED ORDER — CARVEDILOL 3.125 MG PO TABS
3.1250 mg | ORAL_TABLET | Freq: Two times a day (BID) | ORAL | Status: DC
  Administered 2014-08-09 – 2014-08-10 (×3): 3.125 mg via ORAL
  Filled 2014-08-09 (×3): qty 1

## 2014-08-09 MED ORDER — CALCIUM CARBONATE 1250 (500 CA) MG PO TABS
1200.0000 mg | ORAL_TABLET | Freq: Every day | ORAL | Status: DC
  Administered 2014-08-10: 1250 mg via ORAL
  Filled 2014-08-09: qty 3

## 2014-08-09 MED ORDER — MORPHINE SULFATE 2 MG/ML IJ SOLN
1.0000 mg | INTRAMUSCULAR | Status: DC | PRN
  Administered 2014-08-09: 1 mg via INTRAVENOUS
  Filled 2014-08-09: qty 1

## 2014-08-09 MED ORDER — ACETAMINOPHEN 325 MG PO TABS
650.0000 mg | ORAL_TABLET | Freq: Four times a day (QID) | ORAL | Status: DC | PRN
  Administered 2014-08-10: 650 mg via ORAL
  Filled 2014-08-09: qty 2

## 2014-08-09 MED ORDER — ONDANSETRON HCL 4 MG PO TABS: 4.0000 mg | ORAL_TABLET | Freq: Four times a day (QID) | ORAL | Status: DC | PRN

## 2014-08-09 MED ORDER — PROMETHAZINE HCL 12.5 MG PO TABS
12.5000 mg | ORAL_TABLET | Freq: Four times a day (QID) | ORAL | Status: DC
Start: 2014-08-09 — End: 2014-08-10
  Administered 2014-08-10: 12.5 mg via ORAL
  Filled 2014-08-09: qty 1

## 2014-08-09 MED ORDER — AMLODIPINE BESYLATE 5 MG PO TABS
5.0000 mg | ORAL_TABLET | Freq: Every day | ORAL | Status: DC
  Administered 2014-08-09 – 2014-08-10 (×2): 5 mg via ORAL
  Filled 2014-08-09 (×2): qty 1

## 2014-08-09 MED ORDER — FUROSEMIDE 20 MG PO TABS
20.0000 mg | ORAL_TABLET | Freq: Every day | ORAL | Status: DC
  Administered 2014-08-10: 20 mg via ORAL
  Filled 2014-08-09: qty 1

## 2014-08-09 MED ORDER — DOCUSATE SODIUM 100 MG PO CAPS
100.0000 mg | ORAL_CAPSULE | Freq: Two times a day (BID) | ORAL | Status: DC
  Administered 2014-08-09 – 2014-08-10 (×2): 100 mg via ORAL
  Filled 2014-08-09 (×2): qty 1

## 2014-08-09 MED ORDER — ALPRAZOLAM 0.5 MG PO TABS: 0.5000 mg | ORAL_TABLET | Freq: Every evening | ORAL | Status: DC | PRN

## 2014-08-09 MED ORDER — ONDANSETRON HCL 4 MG/2ML IJ SOLN: 4.0000 mg | Freq: Four times a day (QID) | INTRAMUSCULAR | Status: DC | PRN

## 2014-08-09 MED ORDER — ALUM & MAG HYDROXIDE-SIMETH 200-200-20 MG/5ML PO SUSP: 30.0000 mL | Freq: Two times a day (BID) | ORAL | Status: DC | PRN

## 2014-08-09 MED ORDER — GLUCERNA SHAKE PO LIQD
237.0000 mL | Freq: Three times a day (TID) | ORAL | Status: DC
  Administered 2014-08-09 – 2014-08-10 (×2): 237 mL via ORAL

## 2014-08-09 MED ORDER — MELOXICAM 7.5 MG PO TABS
7.5000 mg | ORAL_TABLET | Freq: Two times a day (BID) | ORAL | Status: DC
  Administered 2014-08-09 – 2014-08-10 (×2): 7.5 mg via ORAL
  Filled 2014-08-09 (×6): qty 1

## 2014-08-09 MED ORDER — HYDROCODONE-ACETAMINOPHEN 5-325 MG PO TABS
1.0000 | ORAL_TABLET | Freq: Two times a day (BID) | ORAL | Status: DC
  Administered 2014-08-09: 1 via ORAL
  Filled 2014-08-09 (×2): qty 1

## 2014-08-09 MED ORDER — ALBUTEROL SULFATE (2.5 MG/3ML) 0.083% IN NEBU
INHALATION_SOLUTION | RESPIRATORY_TRACT | Status: AC
  Filled 2014-08-09: qty 3

## 2014-08-09 MED ORDER — PROMETHAZINE HCL 12.5 MG RE SUPP: 12.5000 mg | Freq: Four times a day (QID) | RECTAL | Status: DC

## 2014-08-09 MED ORDER — ONDANSETRON HCL 4 MG/2ML IJ SOLN
4.0000 mg | Freq: Four times a day (QID) | INTRAMUSCULAR | Status: DC | PRN
  Administered 2014-08-09: 4 mg via INTRAVENOUS
  Filled 2014-08-09: qty 2

## 2014-08-09 MED ORDER — BISACODYL 10 MG RE SUPP: 10.0000 mg | Freq: Every day | RECTAL | Status: DC | PRN

## 2014-08-09 MED ORDER — PROMETHAZINE HCL 25 MG/ML IJ SOLN
6.2500 mg | Freq: Four times a day (QID) | INTRAMUSCULAR | Status: DC
  Administered 2014-08-09 – 2014-08-10 (×3): 6.25 mg via INTRAVENOUS
  Filled 2014-08-09 (×3): qty 1

## 2014-08-09 MED ORDER — SODIUM CHLORIDE 0.9 % IV SOLN: 250.0000 mL | INTRAVENOUS | Status: DC | PRN

## 2014-08-09 MED ORDER — HYDROCODONE-ACETAMINOPHEN 5-325 MG PO TABS
1.0000 | ORAL_TABLET | Freq: Once | ORAL | Status: AC
  Administered 2014-08-09: 1 via ORAL
  Filled 2014-08-09: qty 1

## 2014-08-09 MED ORDER — VITAMIN D (ERGOCALCIFEROL) 1.25 MG (50000 UNIT) PO CAPS
50000.0000 [IU] | ORAL_CAPSULE | ORAL | Status: DC
  Administered 2014-08-09: 50000 [IU] via ORAL
  Filled 2014-08-09: qty 1

## 2014-08-09 NOTE — ED Notes (Signed)
EDP at bedside  

## 2014-08-09 NOTE — Clinical Social Work Psychosocial (Signed)
Clinical Social Work Department BRIEF PSYCHOSOCIAL ASSESSMENT 08/09/2014  Patient:  Briana Schultz, Briana Schultz     Account Number:  [id number]     Admit date:  08/09/2014  Clinical Social Worker:  Wyatt Haste  Date/Time:  08/09/2014 03:51 PM  Referred by:  Care Management  Date Referred:  08/09/2014 Referred for  SNF Placement   Other Referral:   Interview type:  Patient Other interview type:   granddaughter- Renee    PSYCHOSOCIAL DATA Living Status:  FAMILY Admitted from facility:   Level of care:   Primary support name:  [name] Primary support relationship to patient:  CHILD, ADULT Degree of support available:   supportive    CURRENT CONCERNS Current Concerns  Post-Acute Placement   Other Concerns:    SOCIAL WORK ASSESSMENT / PLAN CSW met with pt at bedside. Pt alert, but somewhat confused. CSW spoke with pt's granddaughter Joseph Art as daughter, Holley Raring was not initially available. Renee reports that pt lives with [place]. Pt fell this morning. Family believe she was getting up and didn't call for help first. Pt has humerus fracture. At baseline, pt ambulates with a walker, but sometimes uses a wheelchair. Family is becoming increasingly concerned about falls at home and the daughter's ability to continue care. PT evaluated pt and recommendation is for SNF. CSW discussed placement process, including [employer] authorization. Renee requests [facility] placement. Pt has been to [facility] and [facility] in the past. CSW initiated [facility] authorization and bed search.   Assessment/plan status:  Psychosocial Support/Ongoing Assessment of Needs Other assessment/ plan:   Information/referral to community resources:   SNF list    PATIENT'S/FAMILY'S RESPONSE TO PLAN OF CARE: Pt unable to complete assessment. CSW discussed with family and plan for SNF, probably tomorrow per MD.       Benay Pike, [place]

## 2014-08-09 NOTE — Evaluation (Signed)
Physical Therapy Evaluation Patient Details Name: Briana Schultz MRN: [id number] DOB: 1912/10/21 Today's Date: 08/09/2014   History of Present Illness  Briana Schultz is a 81 y.o. female with past medical hx that includes hypertension, aortic stenosis, GERD, pulmonary edema, TIA since emergency department with the chief complaint of fall. Initial evaluation in the emergency department reveals hypoxia with oxygen saturation level 89% on room air, sinus tachycardia rate of 110 and right fracture humerus. Chest x-ray with cardiomegaly, hyperinflation no active cardiopulmonary disease. Right humeral neck/greater tuberosity fracture. X-ray the right humerus 2 view reveals comminuted fracture proximal humeral metaphysis with avulsion of the greater tuberosity. Impaction is noted at fracture site. No dislocation. Bones osteoporotic. EKG with sinus rhythm prolonged PR interval acute changes. Await urinalysis  Clinical Impression   Patient was seen in bed with Right shoulder sling, awake, alert, oriented, cooperative and able to follow directions.Patient demonstrated generalized weakness, inability to use Right arm due to pain (Pain scale of 10/10) and impaired mobility. Overall functional mobility needs max assist.Transfers and Gait not assessed at this time due to pain and weakness.  PT Recommendation for D/C with the patient is to go to SNF in order to increase mobility and progression back to gait .    Follow Up Recommendations SNF    Equipment Recommendations  None recommended by PT    Recommendations for Other Services OT consult     Precautions / Restrictions Precautions Precautions: Fall;Shoulder Shoulder Interventions: Shoulder sling/immobilizer Restrictions Weight Bearing Restrictions: Yes RUE Weight Bearing: Non weight bearing      Mobility  Bed Mobility Overal bed mobility: Needs Assistance Bed Mobility: Supine to Sit;Sit to Supine     Supine to sit: Max assist;HOB  elevated Sit to supine: Max assist;HOB elevated      Transfers                 General transfer comment: Unable to assess due to weakness and pain                     Balance Overall balance assessment: Needs assistance Sitting-balance support: Single extremity supported;Feet supported Sitting balance-Leahy Scale: Fair         Standing balance comment: not tested at this time                             Pertinent Vitals/Pain Pain Assessment: 0-10 Pain Score: 10-Worst pain ever Pain Location: Right Shoulder Pain Descriptors / Indicators: Aching Pain Intervention(s): Premedicated before session;Limited activity within patient's tolerance    Home Living Family/patient expects to be discharged to:: Skilled nursing facility Living Arrangements: Children                    Prior Function Level of Independence: Independent with assistive device(s)         Comments: PTA pt ambulating with RW     Hand Dominance   Dominant Hand: Right    Extremity/Trunk Assessment   Upper Extremity Assessment: Defer to OT evaluation RUE Deficits / Details: Right UE in sling due to recent fracture         Lower Extremity Assessment: Generalized weakness      Cervical / Trunk Assessment: Kyphotic  Communication   Communication: No difficulties  Cognition Arousal/Alertness: Awake/alert Behavior During Therapy: WFL for tasks assessed/performed Overall Cognitive Status: Within Functional Limits for tasks assessed  Assessment/Plan    PT Assessment All further PT needs can be met in the next venue of care  PT Diagnosis Difficulty walking;Generalized weakness;Acute pain   PT Problem List Decreased strength;Decreased range of motion;Decreased activity tolerance;Decreased balance;Decreased mobility;Decreased safety awareness;Pain  PT Treatment Interventions     PT Goals (Current goals can be  found in the Care Plan section) Acute Rehab PT Goals PT Goal Formulation: All assessment and education complete, DC therapy                  End of Session   Activity Tolerance: Patient limited by fatigue;Patient limited by pain Patient left: in bed;with family/visitor present;with call bell/phone within reach           Time: 9810-2548 PT Time Calculation (min) (ACUTE ONLY): 17 min   Charges:   PT Evaluation $Initial PT Evaluation Tier I: 1 Procedure     PT G Codes:        Briana Schultz, December 05, 2022 A September 04, 2014, 2:44 PM

## 2014-08-09 NOTE — ED Notes (Signed)
Patient placed in gown and on cardiac monitor. Patient able to use bedpan. Obtained urine specimen.

## 2014-08-09 NOTE — H&P (Signed)
[facility] History and Physical  GEM CONKLE WUJ:[id number] DOB: December 14, 1912 DOA: 08/09/2014  Referring physician:  PCP: Pamelia Hoit, MD   Chief Complaint: fall/shoulder pain  HPI: Briana Schultz is a 81 y.o. female with past medical hx that includes hypertension, aortic stenosis, GERD, pulmonary edema, TIA since emergency department with the chief complaint of fall. Initial evaluation in the emergency department reveals hypoxia with oxygen saturation level 89% on room air, sinus tachycardia rate of 110 and right fracture humerus.  Patient lives with her daughter who reports mechanical fall this morning when transferring from bed to bedside commode. There is no loss of consciousness. Patient reports immediate pain to her right shoulder. She denies chest pain palpitations shortness of breath dizziness syncope or near-syncope. There is been no recent fever chills or sick contacts. No nausea vomiting or diarrhea. He reports pain in right shoulder constant sharp worse with movement. Patient usually ambulates at home with a walker.  Workup in the emergency department reveals a leukocytosis of 12.6 hemoglobin 11.0 serum glucose 133. Chest x-ray with cardiomegaly, hyperinflation no active cardiopulmonary disease. Right humeral neck/greater tuberosity fracture. X-ray the right humerus 2 view reveals comminuted fracture proximal humeral metaphysis with avulsion of the greater tuberosity. Impaction is noted at fracture site. No dislocation. Bones osteoporotic. EKG with sinus rhythm prolonged PR interval acute changes. Await urinalysis  She is hemodynamically stable afebrile and nontoxic appearing. Saturation level 95% on 2 L.  Review of Systems:  10 point review of systems complete and all systems are negative except as indicated in the history of present illness  Past Medical History  Diagnosis Date  . Fall     secondary to weakness as of  [place] OV  03/26/10  . H/O orthostatic  hypotension   . Chest discomfort     anterior  . Hypertension   . Aortic valve stenosis     mild to moderate  . Syncope and collapse 2006  . Arthritis   . GERD (gastroesophageal reflux disease)   . Osteoporosis   . Abnormal EKG   . Scarlet fever   . Scoliosis   . TIA (transient ischemic attack) 1999   Past Surgical History  Procedure Laterality Date  . Cholecystectomy    . Cataract extraction Bilateral   . Femur im nail Right 12/17/2013    Procedure: INTRAMEDULLARY (IM) NAIL FEMORAL;  Surgeon: Sheral Apley, MD;  Location: [facility] OR;  Service: Orthopedics;  Laterality: Right;   Social History:  reports that she quit smoking about 33 years ago. She has never used smokeless tobacco. She reports that she does not drink alcohol or use illicit drugs. Visit home with her daughter. She ambulates with a walker since her hip surgery last August. She needs assistance with ADLs. Able to make wants and needs known Allergies  Allergen Reactions  . Amoxicillin Other (See Comments)    unknown  . Doxycycline Other (See Comments)    unknown  . Fosamax [Alendronate] Other (See Comments)    unknown  . Septra [Sulfamethoxazole-Trimethoprim] Other (See Comments)    unknown  . Tramadol     disoriented    Family History  Problem Relation Age of Onset  . Pneumonia Mother   . Heart failure Father   . Diabetes Father   . Heart disease Sister      Prior to Admission medications   Medication Sig Start Date End Date Taking? Authorizing Provider  acetaminophen (TYLENOL) 500 MG tablet Take 2,000 mg by mouth daily  as needed for moderate pain.   Yes Historical Provider, MD  ALPRAZolam Prudy Feeler(XANAX) 0.5 MG tablet Take one tablet by mouth at bedtime to help rest 12/26/13  Yes Mahima Glade LloydPandey, MD  alum & mag hydroxide-simeth (MAALOX/MYLANTA) 200-200-20 MG/5ML suspension Take 30 mLs by mouth 2 (two) times daily as needed for indigestion.   Yes Historical Provider, MD  amLODipine (NORVASC) 5 MG tablet Take 5 mg by  mouth daily.   Yes Historical Provider, MD  aspirin EC 325 MG EC tablet Take 1 tablet (325 mg total) by mouth daily with breakfast. 12/19/13  Yes Calvert CantorSaima Rizwan, MD  calcium carbonate (OS-CAL) 600 MG TABS Take 1,200 mg by mouth daily with breakfast.    Yes Historical Provider, MD  carvedilol (COREG) 3.125 MG tablet Take 1 tablet (3.125 mg total) by mouth 2 (two) times daily. 02/04/14  Yes Antoine PocheJonathan F Branch, MD  docusate sodium (COLACE) 100 MG capsule Take 100 mg by mouth daily as needed for mild constipation.   Yes Historical Provider, MD  esomeprazole (NEXIUM) 40 MG capsule Take 40 mg by mouth daily.    Yes Historical Provider, MD  halobetasol (ULTRAVATE) 0.05 % cream Apply 1 application topically daily.   Yes Historical Provider, MD  loratadine (CLARITIN) 10 MG tablet Take 10 mg by mouth daily.   Yes Historical Provider, MD  meloxicam (MOBIC) 7.5 MG tablet Take 1 tablet by mouth 2 (two) times daily. 07/12/14  Yes Historical Provider, MD  Multiple Vitamin (MULTIVITAMIN WITH MINERALS) TABS Take 1 tablet by mouth daily.   Yes Historical Provider, MD  promethazine (PHENERGAN) 25 MG tablet Take 12.5 mg by mouth every 4 (four) hours as needed for nausea or vomiting.   Yes Historical Provider, MD  Vitamin D, Ergocalciferol, (DRISDOL) 50000 UNITS CAPS capsule Take 50,000 Units by mouth every 7 (seven) days. Take on Friday.   Yes Historical Provider, MD  docusate sodium (COLACE) 100 MG capsule Take 1 capsule (100 mg total) by mouth 2 (two) times daily. Continue this while taking narcotics to help with bowel movements 12/17/13   Sheral Apleyimothy D Murphy, MD  feeding supplement (GLUCERNA SHAKE) LIQD Take 237 mLs by mouth 3 (three) times daily between meals.  01/19/12   Vassie Lollarlos Madera, MD  ferrous sulfate 325 (65 FE) MG tablet Take 1 tablet (325 mg total) by mouth 2 (two) times daily with a meal. 12/19/13   Calvert CantorSaima Rizwan, MD  furosemide (LASIX) 20 MG tablet Take 20 mg every other day Patient not taking: Reported on 08/09/2014  01/21/14   Pricilla RifflePaula Ross V, MD   Physical Exam: Filed Vitals:   08/09/14 0956 08/09/14 1000 08/09/14 1030 08/09/14 1100  BP:  135/61 147/65 142/85  Pulse:  102 102 100  Temp: 98 F (36.7 C)     TempSrc: Oral     Resp:      Weight:      SpO2:  93% 99% 97%    Wt Readings from Last 3 Encounters:  08/09/14 43.545 kg (96 lb)  02/06/14 43.273 kg (95 lb 6.4 oz)  02/04/14 46.267 kg (102 lb)    General:  Appears calm and comfortable, somewhat thin and frail Eyes: PERRL, normal lids, irises & conjunctiva ENT: grossly normal hearing, his membranes of her mouth are pink slightly dry Neck: no LAD, masses or thyromegaly Cardiovascular: RRR, +murmur. No gallop no rub No LE edema. Pulses present and palpable  Respiratory:  Normal effort somewhat shallow but CTA bilaterally, no w/r/r. Poor cough effort Abdomen: soft, ntnd positive  bowel sounds no guarding Skin: no rash or induration seen on limited exam inside of left nare with small open wound no drainage mild erythema very tender Musculoskeletal: Poor tone right arm in sling fingers of right hand warm to touch no swelling full range of motion to fingers. Right shoulder tender to touch decreased range of motion due to pain other joints without swelling/erythema Psychiatric: grossly normal mood and affect, speech fluent and appropriate Neurologic: grossly non-focal. Speech clear           Labs on Admission:  Basic Metabolic Panel:  Recent Labs Lab 08/09/14 0938  NA 139  K 3.8  CL 101  CO2 36*  GLUCOSE 133*  BUN 16  CREATININE 0.56  CALCIUM 8.7   Liver Function Tests: No results for input(s): AST, ALT, ALKPHOS, BILITOT, PROT, ALBUMIN in the last 168 hours. No results for input(s): LIPASE, AMYLASE in the last 168 hours. No results for input(s): AMMONIA in the last 168 hours. CBC:  Recent Labs Lab 08/09/14 0938  WBC 12.6*  NEUTROABS 11.2*  HGB 11.0*  HCT 34.1*  MCV 98.0  PLT 162   Cardiac Enzymes: No results for input(s):  CKTOTAL, CKMB, CKMBINDEX, TROPONINI in the last 168 hours.  BNP (last 3 results) No results for input(s): BNP in the last 8760 hours.  ProBNP (last 3 results)  Recent Labs  01/02/14 1212  PROBNP 456.0*    CBG: No results for input(s): GLUCAP in the last 168 hours.  Radiological Exams on Admission: Dg Shoulder Right  08/09/2014   CLINICAL DATA:  Pain following fall getting out of bed  EXAM: RIGHT SHOULDER - 2+ VIEW  COMPARISON:  None.  FINDINGS: Frontal, Y scapular, and oblique views were obtained. There is a comminuted fracture of the proximal humeral metaphysis with impaction at the fracture site. There is avulsion and greater tuberosity region. No other fractures. No dislocation. Moderate generalized osteoarthritic change present.  IMPRESSION: Comminuted fracture proximal humeral metaphysis with impaction. Avulsion greater tuberosity. No dislocation.   Electronically Signed   By: Bretta Bang M.D.   On: 08/09/2014 07:01   Dg Humerus Right  08/09/2014   CLINICAL DATA:  Pain following fall getting out of bed  EXAM: RIGHT HUMERUS - 2+ VIEW  COMPARISON:  None.  FINDINGS: Frontal and lateral views were obtained. There is a comminuted fracture of the proximal humeral metaphysis with impaction at the fracture site. There is avulsion of the greater tuberosity. No other fractures. No dislocation. Bones are osteoporotic.  IMPRESSION: Comminuted fracture proximal humeral metaphysis with avulsion of the greater tuberosity. Impaction is noted at fracture site. No dislocation. Bones osteoporotic.   Electronically Signed   By: Bretta Bang M.D.   On: 08/09/2014 07:02      Assessment/Plan Principal Problem:   Hypoxia: Mild. Patient then frail with poor cough effort. No reported respiratory infections. Chest x-ray without infiltrate or edema. Will admit to medical floor. Will provide nebulizers. Continue oxygen supplementation and monitor oxygen saturation level. Incentive spirometry every 2  hours. Active Problems: Humerus fracture: Related to fall. Have requested orthopedic consult. Sling intact. Pain controlled   Leukocytosis: Mild. Afebrile nontoxic appearing. Chest x-ray as noted above. Await urinalysis report. May be reactive. Will monitor  Tachycardia: Likely related to pain. Heart rate improved at time of my exam. Of note patient did have episode of A. fib in August 2015 after hip surgery. Will continue her home Coreg. Monitor   Diastolic CHF: Chronic. Diagnosed while in hospital in August  last year. Echo at that time revealed mild LVH with an EF of 60%. Continue Coreg. Hold home Lasix that is ordered 20 mg every other day. Monitor intake and output and daily weight. Chart review indicates last seen cardiology August 2015.   HTN (hypertension): Controlled. Will continue her home amlodipine, Coreg, I will hold her Lasix for now. Currently as ordered 20 mg every other day.    Fall: Mechanical. Will have PT and OT evaluate. Will likely need SNIF placement given that she uses a walker at home and now has a fracture shoulder.    GERD (gastroesophageal reflux disease): Stable at baseline    Normocytic anemia: Mild. Current hemoglobin 11. No Signs symptoms of bleeding. Of note chart review indicates anemia was an issue postoperatively in August 2015 and patient was started on by mouth iron at discharge.  Will monitor     Protein calorie malnutrition: Nutritional supplement.  Atrial fibrillation. Patient diagnosed in August 2015 evaluated by cardiology who confirmed diagnosis via telemetry monitoring. She was started on Coreg. No anticoagulation due to fall risk. Currently she denies any chest pain or palpitations.        orthopedics    Code Status: DNR DVT Prophylaxis: Family Communication: daughter and granddaughter at bedside Disposition Plan: snf ime spent: 23 minues  [facility] Pager [phone]

## 2014-08-09 NOTE — ED Notes (Addendum)
Pt o2 saturation 89% on 2liters. Pt oxygen increased to 3liters. o2 saturation increased to 94%. nad noted. Pt denies wearing oxygen at home. EDP aware. No new orders given.

## 2014-08-09 NOTE — ED Notes (Signed)
Pt in by ems from home after a fall this morning.  Pt told ems that she was getting up from the bed and she fell landing on her right shoulder.

## 2014-08-09 NOTE — Progress Notes (Signed)
Shoulder immobilizer x 3 weeks   xrays in my office  In 3 weeks

## 2014-08-09 NOTE — ED Notes (Signed)
Receiving RN reported would call back for report.

## 2014-08-09 NOTE — Clinical Social Work Placement (Signed)
Clinical Social Work Department CLINICAL SOCIAL WORK PLACEMENT NOTE 08/09/2014  Patient:  Briana Schultz,Briana Schultz  Account Number:  [id number] Admit date:  08/09/2014  Clinical Social Worker:  Derenda FennelKARA Nieves Barberi, LCSW  Date/time:  08/09/2014 03:34 PM  Clinical Social Work is seeking post-discharge placement for this patient at the following level of care:   SKILLED NURSING   (*CSW will update this form in Epic as items are completed)   08/09/2014  [facility]/family provided with [facility] Department of Clinical Social Work's list of facilities offering this level of care within the geographic area requested by the patient (or if unable, by the patient's family).  08/09/2014  Patient/family informed of their freedom to choose among providers that offer the needed level of care, that participate in Medicare, Medicaid or managed care program needed by the patient, have an available bed and are willing to accept the patient.  08/09/2014  Patient/family informed of [facility]' ownership interest in [facility], as well as of the fact that they are under no obligation to receive care at this facility.  PASARR submitted to EDS on  PASARR number received on   FL2 transmitted to all facilities in geographic area requested by pt/family on  08/09/2014 FL2 transmitted to all facilities within larger geographic area on   Patient informed that his/her managed care company has contracts with or will negotiate with  certain facilities, including the following:     Patient/family informed of bed offers received:  08/09/2014 [facility] Physician recommends and patient chooses bed at    Patient to be transferred to  on   Patient to be transferred to facility by  Patient and family notified of transfer on  Name of family member notified:    The following physician request were entered in Epic:   Additional Comments: Pt has existing pasarr.  Derenda FennelKara Karema Tocci,  [place] [phone]

## 2014-08-09 NOTE — ED Notes (Signed)
NP at bedside.

## 2014-08-09 NOTE — Clinical Social Work Note (Signed)
Pt's daughter, Rivka BarbaraGlenda [facility]. Facility aware of probable d/c tomorrow.  Derenda FennelKara Jackelin Correia, [place] [phone]

## 2014-08-09 NOTE — ED Provider Notes (Addendum)
CSN: [id number]     Arrival date & time 08/09/14  0542 History  This chart was scribed for Briana Mulders, MD by Luisa Dago, ED Scribe. This patient was seen in room APA18/APA18 and the patient's care was started at 8:56 AM.    Chief Complaint  Patient presents with  . Fall  . Shoulder Injury   The history is provided by medical records, a relative and a caregiver. No language interpreter was used.   Level 5 Caveat--pt's age and condition  HPI Comments: Briana Schultz is a 81 y.o. female who is prone to falls secondary to weakness presents to the Emergency Department complaining of a fall that occurred this AM at 4 oclock.Daughter states that she was getting out of bed to use the bedside commode when she lost her balance and fell unto her right shoulder. She is currently complaining of associated right shoulder pain. Pt lives with her daughter at home. Pt is able to walk with a walker. Denies any LOC or head injury.   Past Medical History  Diagnosis Date  . Fall     secondary to weakness as of  [place] OV  03/26/10  . H/O orthostatic hypotension   . Chest discomfort     anterior  . Hypertension   . Aortic valve stenosis     mild to moderate  . Syncope and collapse 2006  . Arthritis   . GERD (gastroesophageal reflux disease)   . Osteoporosis   . Abnormal EKG   . Scarlet fever   . Scoliosis   . TIA (transient ischemic attack) 1999   Past Surgical History  Procedure Laterality Date  . Cholecystectomy    . Cataract extraction Bilateral   . Femur im nail Right 12/17/2013    Procedure: INTRAMEDULLARY (IM) NAIL FEMORAL;  Surgeon: Sheral Apley, MD;  Location: [facility] OR;  Service: Orthopedics;  Laterality: Right;   Family History  Problem Relation Age of Onset  . Pneumonia Mother   . Heart failure Father   . Diabetes Father   . Heart disease Sister    History  Substance Use Topics  . Smoking status: Former Smoker    Quit date: 01/16/1981  . Smokeless tobacco: Never Used   . Alcohol Use: No   OB History    No data available     Review of Systems  Unable to perform ROS: Age  Musculoskeletal: Negative for arthralgias.   Allergies  Amoxicillin; Doxycycline; Fosamax; Septra; and Tramadol  Home Medications   Prior to Admission medications   Medication Sig Start Date End Date Taking? Authorizing Provider  acetaminophen (TYLENOL) 500 MG tablet Take 2,000 mg by mouth daily as needed for moderate pain.   Yes Historical Provider, MD  ALPRAZolam Prudy Feeler) 0.5 MG tablet Take one tablet by mouth at bedtime to help rest 12/26/13  Yes Mahima Glade Lloyd, MD  alum & mag hydroxide-simeth (MAALOX/MYLANTA) 200-200-20 MG/5ML suspension Take 30 mLs by mouth 2 (two) times daily as needed for indigestion.   Yes Historical Provider, MD  amLODipine (NORVASC) 5 MG tablet Take 5 mg by mouth daily.   Yes Historical Provider, MD  aspirin EC 325 MG EC tablet Take 1 tablet (325 mg total) by mouth daily with breakfast. 12/19/13  Yes Calvert Cantor, MD  calcium carbonate (OS-CAL) 600 MG TABS Take 1,200 mg by mouth daily with breakfast.    Yes Historical Provider, MD  carvedilol (COREG) 3.125 MG tablet Take 1 tablet (3.125 mg total) by mouth 2 (two) times  daily. 02/04/14  Yes Antoine Poche, MD  docusate sodium (COLACE) 100 MG capsule Take 100 mg by mouth daily as needed for mild constipation.   Yes Historical Provider, MD  esomeprazole (NEXIUM) 40 MG capsule Take 40 mg by mouth daily.    Yes Historical Provider, MD  halobetasol (ULTRAVATE) 0.05 % cream Apply 1 application topically daily.   Yes Historical Provider, MD  loratadine (CLARITIN) 10 MG tablet Take 10 mg by mouth daily.   Yes Historical Provider, MD  meloxicam (MOBIC) 7.5 MG tablet Take 1 tablet by mouth 2 (two) times daily. 07/12/14  Yes Historical Provider, MD  Multiple Vitamin (MULTIVITAMIN WITH MINERALS) TABS Take 1 tablet by mouth daily.   Yes Historical Provider, MD  promethazine (PHENERGAN) 25 MG tablet Take 12.5 mg by mouth every  4 (four) hours as needed for nausea or vomiting.   Yes Historical Provider, MD  Vitamin D, Ergocalciferol, (DRISDOL) 50000 UNITS CAPS capsule Take 50,000 Units by mouth every 7 (seven) days. Take on Friday.   Yes Historical Provider, MD  docusate sodium (COLACE) 100 MG capsule Take 1 capsule (100 mg total) by mouth 2 (two) times daily. Continue this while taking narcotics to help with bowel movements 12/17/13   Sheral Apley, MD  feeding supplement (GLUCERNA SHAKE) LIQD Take 237 mLs by mouth 3 (three) times daily between meals.  01/19/12   Vassie Loll, MD  ferrous sulfate 325 (65 FE) MG tablet Take 1 tablet (325 mg total) by mouth 2 (two) times daily with a meal. 12/19/13   Calvert Cantor, MD  furosemide (LASIX) 20 MG tablet Take 20 mg every other day Patient not taking: Reported on 08/09/2014 01/21/14   Pricilla Riffle, MD   BP 168/78 mmHg  Pulse 105  Temp(Src) 98.6 F (37 C)  Resp 32  Wt 96 lb (43.545 kg)  SpO2 90%  Physical Exam  Constitutional: She is oriented to person, place, and time. She appears well-developed and well-nourished. No distress.  HENT:  Head: Normocephalic and atraumatic.  Mouth/Throat: Mucous membranes are dry.  Eyes: Conjunctivae and EOM are normal. Pupils are equal, round, and reactive to light. No scleral icterus.  Neck: Neck supple.  Cardiovascular: Normal rate, regular rhythm and normal heart sounds.  Exam reveals no gallop and no friction rub.   No murmur heard. Pulses:      Radial pulses are 2+ on the right side, and 2+ on the left side.       Dorsalis pedis pulses are 2+ on the right side, and 2+ on the left side.       Posterior tibial pulses are 2+ on the right side, and 2+ on the left side.  Pulmonary/Chest: Effort normal and breath sounds normal. No respiratory distress. She has no wheezes. She has no rales.  Abdominal: Soft. Bowel sounds are normal. She exhibits no distension. There is no tenderness.  Musculoskeletal: Normal range of motion.  No swelling  in bilateral extremities. Pt is moving all 4 extremities.  Neurological: She is alert and oriented to person, place, and time.  Skin: Skin is warm and dry.  Psychiatric: She has a normal mood and affect. Her behavior is normal.  Nursing note and vitals reviewed.   ED Course  Procedures (including critical care time)  DIAGNOSTIC STUDIES: Oxygen Saturation is 90% on RA, low by my interpretation.    COORDINATION OF CARE: 9:12 AM- Family wanting to admit pt. They state that they don't have the appropriate environment and resources  to take care of the pt at home. Family advised of the treatment plan and they agree.  Imaging Review Dg Shoulder Right  08/09/2014   CLINICAL DATA:  Pain following fall getting out of bed  EXAM: RIGHT SHOULDER - 2+ VIEW  COMPARISON:  None.  FINDINGS: Frontal, Y scapular, and oblique views were obtained. There is a comminuted fracture of the proximal humeral metaphysis with impaction at the fracture site. There is avulsion and greater tuberosity region. No other fractures. No dislocation. Moderate generalized osteoarthritic change present.  IMPRESSION: Comminuted fracture proximal humeral metaphysis with impaction. Avulsion greater tuberosity. No dislocation.   Electronically Signed   By: Bretta BangWilliam  Woodruff M.D.   On: 08/09/2014 07:01   Dg Humerus Right  08/09/2014   CLINICAL DATA:  Pain following fall getting out of bed  EXAM: RIGHT HUMERUS - 2+ VIEW  COMPARISON:  None.  FINDINGS: Frontal and lateral views were obtained. There is a comminuted fracture of the proximal humeral metaphysis with impaction at the fracture site. There is avulsion of the greater tuberosity. No other fractures. No dislocation. Bones are osteoporotic.  IMPRESSION: Comminuted fracture proximal humeral metaphysis with avulsion of the greater tuberosity. Impaction is noted at fracture site. No dislocation. Bones osteoporotic.   Electronically Signed   By: Bretta BangWilliam  Woodruff M.D.   On: 08/09/2014 07:02      EKG Interpretation   Date/Time:  Friday August 09 2014 09:34:52 EST Ventricular Rate:  97 PR Interval:  253 QRS Duration: 93 QT Interval:  407 QTC Calculation: 517 R Axis:   -72 Text Interpretation:  Sinus rhythm Atrial premature complex Prolonged PR  interval Anterolateral infarct, age indeterminate Prolonged QT interval  Confirmed by Deretha EmoryZACKOWSKI  MD, Prabhnoor Ellenberger [phone]([id number]) on 08/09/2014 9:51:39 AM      MDM   Final diagnoses:  Fall, initial encounter  Proximal humerus fracture, right, closed, initial encounter    The patient was will require medical admission due to not being take care of herself at home. Patient normally uses a walker. Patient without proximal right humerus fracture. Patient will probably require nursing home placement or rehabilitation placement. Patient with a fall at home while moving from bed to bedside commode. Resulting in the fracture no other injuries. Consult also placed for orthopedics to follow her in the hospital. Patient placed in a shoulder immobilizer on the right side and treated with pain medicine. Patient feeling better. No complicating factors.  I personally performed the services described in this documentation, which was scribed in my presence. The recorded information has been reviewed and is accurate.     Briana MuldersScott Paiten Boies, MD 08/09/14 1028  Briana MuldersScott Shaney Deckman, MD 08/09/14 1054

## 2014-08-09 NOTE — ED Notes (Addendum)
Attempted report. No answer at nurses station. Will call back.

## 2014-08-09 NOTE — Clinical Social Work Placement (Signed)
Clinical Social Work Department CLINICAL SOCIAL WORK PLACEMENT NOTE 08/09/2014  Patient:  Dow AdolphSHAFFER,Braelynn M  Account Number:  [id number] Admit date:  08/09/2014  Clinical Social Worker:  Derenda FennelKARA Neesa Knapik, LCSW  Date/time:  08/09/2014 03:34 PM  Clinical Social Work is seeking post-discharge placement for this patient at the following level of care:   SKILLED NURSING   (*CSW will update this form in Epic as items are completed)   08/09/2014  Patient/family provided with [facility] Department of Clinical Social Work's list of facilities offering this level of care within the geographic area requested by the patient (or if unable, by the patient's family).  08/09/2014  Patient/family informed of their freedom to choose among providers that offer the needed level of care, that participate in Medicare, Medicaid or managed care program needed by the patient, have an available bed and are willing to accept the patient.  08/09/2014  Patient/family informed of [facility]' ownership interest in [facility], as well as of the fact that they are under no obligation to receive care at this facility.  PASARR submitted to EDS on  PASARR number received on   FL2 transmitted to all facilities in geographic area requested by pt/family on  08/09/2014 FL2 transmitted to all facilities within larger geographic area on   Patient informed that his/her managed care company has contracts with or will negotiate with  certain facilities, including the following:     Patient/family informed of bed offers received:   Patient chooses bed at  Physician recommends and patient chooses bed at    Patient to be transferred to  on   Patient to be transferred to facility by  Patient and family notified of transfer on  Name of family member notified:    The following physician request were entered in Epic:   Additional Comments: Pt has existing pasarr.  Derenda FennelKara Karl Knarr, KentuckyLCSW [phone]

## 2014-08-09 NOTE — ED Notes (Signed)
Pt family inquiring about assistance at home. EDP and Case Manager aware. EDP reported pt to be admitted. Family aware and case manager at bedside. nad noted.

## 2014-08-09 NOTE — Progress Notes (Signed)
Dr. Romeo AppleHarrison paged for ortho consult.

## 2014-08-09 NOTE — ED Notes (Signed)
Attempted report again. No answer at receiving unit nurses station.

## 2014-08-09 NOTE — Care Management Note (Signed)
Spoke with patient, daughter Rivka BarbaraGlenda, [place], and granddaughter, Selena BattenKim, who are visiting in the room. The pt states she does not want to go to NH, but daughter states she may need to go short- term, because she cannot care for her unless she is able to at least transfer/and or walk to and from the bedside commode/ bathroom, with walker. Explained the way Medicare, which is health insurance, and rehab insurance, works- not an Community education officerinsurance to cover custodial care. Pt also has Medicaid, which may cover LTC.They were given the list of Medicare/Medicaid Facilities in 50 mile radius of [facility]. Pt may be admitted- O2 sats were low even on O2, and does not use O2 at home, so family can speak with CSW about Medicaid placement once on the unit. Both daughter and granddaughter verbalize understanding of this and are appreciative of the information, and of CM visit. Pt has had analgesic and has gone to sleep during conversation  CSW notified consult.

## 2014-08-09 NOTE — Consult Note (Signed)
Reason for Consult: right proximal humerus fracture  Referring Physician: Dr Juliann Pares Briana Schultz is an 81 y.o. female.  HPI: 81 yo fell injured right humerus c/o severe non radiating deep aching dull pain right shoulder. X-rays show proximal humerus fracture nonoperative treatment advised  Past Medical History  Diagnosis Date  . Fall     secondary to weakness as of  [place]  03/26/10  . H/O orthostatic hypotension   . Chest discomfort     anterior  . Hypertension   . Aortic valve stenosis     mild to moderate  . Syncope and collapse 2006  . Arthritis   . GERD (gastroesophageal reflux disease)   . Osteoporosis   . Abnormal EKG   . Scarlet fever   . Scoliosis   . TIA (transient ischemic attack) 1999  . Fracture, humerus     2016  . A-fib     02/2014  . Chronic atrial fibrillation     Past Surgical History  Procedure Laterality Date  . Cholecystectomy    . Cataract extraction Bilateral   . Femur im nail Right 12/17/2013    Procedure: INTRAMEDULLARY (IM) NAIL FEMORAL;  Surgeon: Renette Butters, MD;  Location: [facility];  Service: Orthopedics;  Laterality: Right;    Family History  Problem Relation Age of Onset  . Pneumonia Mother   . Heart failure Father   . Diabetes Father   . Heart disease Sister     Social History:  reports that she quit smoking about 33 years ago. She has never used smokeless tobacco. She reports that she does not drink alcohol or use illicit drugs.  Allergies:  Allergies  Allergen Reactions  . Amoxicillin Other (See Comments)    unknown  . Doxycycline Other (See Comments)    unknown  . Fosamax [Alendronate] Other (See Comments)    unknown  . Septra [Sulfamethoxazole-Trimethoprim] Other (See Comments)    unknown  . Tramadol     disoriented    Medications: I have reviewed the patient's current medications.  Results for orders placed or performed during the hospital encounter of 08/09/14 (from the past 48 hour(s))  TSH     Status:  None   Collection Time: 08/09/14  9:32 AM  Result Value Ref Range   TSH 1.985 0.350 - 4.500 uIU/mL  CBC with Differential/Platelet     Status: Abnormal   Collection Time: 08/09/14  9:38 AM  Result Value Ref Range   WBC 12.6 (H) 4.0 - 10.5 K/uL   RBC 3.48 (L) 3.87 - 5.11 MIL/uL   Hemoglobin 11.0 (L) 12.0 - 15.0 g/dL   HCT 34.1 (L) 36.0 - 46.0 %   MCV 98.0 78.0 - 100.0 fL   MCH 31.6 26.0 - 34.0 pg   MCHC 32.3 30.0 - 36.0 g/dL   RDW 13.9 11.5 - 15.5 %   Platelets 162 150 - 400 K/uL   Neutrophils Relative % 90 (H) 43 - 77 %   Neutro Abs 11.2 (H) 1.7 - 7.7 K/uL   Lymphocytes Relative 6 (L) 12 - 46 %   Lymphs Abs 0.8 0.7 - 4.0 K/uL   Monocytes Relative 4 3 - 12 %   Monocytes Absolute 0.6 0.1 - 1.0 K/uL   Eosinophils Relative 0 0 - 5 %   Eosinophils Absolute 0.0 0.0 - 0.7 K/uL   Basophils Relative 0 0 - 1 %   Basophils Absolute 0.0 0.0 - 0.1 K/uL  Basic metabolic panel  Status: Abnormal   Collection Time: 08/09/14  9:38 AM  Result Value Ref Range   Sodium 139 135 - 145 mmol/L   Potassium 3.8 3.5 - 5.1 mmol/L   Chloride 101 96 - 112 mmol/L   CO2 36 (H) 19 - 32 mmol/L   Glucose, Bld 133 (H) 70 - 99 mg/dL   BUN 16 6 - 23 mg/dL   Creatinine, Ser 0.56 0.50 - 1.10 mg/dL   Calcium 8.7 8.4 - 10.5 mg/dL   GFR calc non Af Amer 75 (L) >90 mL/min   GFR calc Af Amer 86 (L) >90 mL/min    Comment: (NOTE) The eGFR has been calculated using the CKD EPI equation. This calculation has not been validated in all clinical situations. eGFR's persistently <90 mL/min signify possible Chronic Kidney Disease.    Anion gap NOT CALCULATED 5 - 15  Urinalysis, Routine w reflex microscopic     Status: Abnormal   Collection Time: 08/09/14 11:26 AM  Result Value Ref Range   Color, Urine YELLOW YELLOW   APPearance CLEAR CLEAR   Specific Gravity, Urine 1.015 1.005 - 1.030   pH 7.5 5.0 - 8.0   Glucose, UA NEGATIVE NEGATIVE mg/dL   Hgb urine dipstick SMALL (A) NEGATIVE   Bilirubin Urine NEGATIVE  NEGATIVE   Ketones, ur NEGATIVE NEGATIVE mg/dL   Protein, ur 30 (A) NEGATIVE mg/dL   Urobilinogen, UA 0.2 0.0 - 1.0 mg/dL   Nitrite NEGATIVE NEGATIVE   Leukocytes, UA NEGATIVE NEGATIVE  Urine microscopic-add on     Status: Abnormal   Collection Time: 08/09/14 11:26 AM  Result Value Ref Range   RBC / HPF 3-6 <3 RBC/hpf   Bacteria, UA FEW (A) RARE    Dg Shoulder Right  08/09/2014   CLINICAL DATA:  Pain following fall getting out of bed  EXAM: RIGHT SHOULDER - 2+ VIEW  COMPARISON:  None.  FINDINGS: Frontal, Y scapular, and oblique views were obtained. There is a comminuted fracture of the proximal humeral metaphysis with impaction at the fracture site. There is avulsion and greater tuberosity region. No other fractures. No dislocation. Moderate generalized osteoarthritic change present.  IMPRESSION: Comminuted fracture proximal humeral metaphysis with impaction. Avulsion greater tuberosity. No dislocation.   Electronically Signed   By: Lowella Grip M.D.   On: 08/09/2014 07:01   Dg Chest Port 1 View  08/09/2014   CLINICAL DATA:  Fall, hypertension.  EXAM: CHEST  1 VIEW  COMPARISON:  12/30/2013  FINDINGS: Cardiomegaly. No confluent airspace opacities or effusions. Hyperinflation. No edema.  There is a fracture through the right humeral neck and greater tuberosity.  IMPRESSION: Cardiomegaly, hyperinflation.  No active cardiopulmonary disease.  Right humeral neck/greater tuberosity fracture.   Electronically Signed   By: Rolm Baptise M.D.   On: 08/09/2014 11:25   Dg Humerus Right  08/09/2014   CLINICAL DATA:  Pain following fall getting out of bed  EXAM: RIGHT HUMERUS - 2+ VIEW  COMPARISON:  None.  FINDINGS: Frontal and lateral views were obtained. There is a comminuted fracture of the proximal humeral metaphysis with impaction at the fracture site. There is avulsion of the greater tuberosity. No other fractures. No dislocation. Bones are osteoporotic.  IMPRESSION: Comminuted fracture proximal humeral  metaphysis with avulsion of the greater tuberosity. Impaction is noted at fracture site. No dislocation. Bones osteoporotic.   Electronically Signed   By: Lowella Grip M.D.   On: 08/09/2014 07:02    ROS Blood pressure 194/74, pulse 100, temperature 98.5 F (  36.9 C), temperature source Oral, resp. rate 22, height $RemoveBe'5\' 1"'siARYoDAJ$  (1.549 m), weight 97 lb 3.6 oz (44.1 kg), SpO2 93 %. Physical Exam Small framed female awake alert oriented to person place and knows her family. She is ambulatory but she is in bed right now. She does need a walker.  She has tenderness over the right proximal humerus without deformity. Muscle tone is normal she's neurovascularly intact her motion is limited by pain stability tests were deferred the elbow wrist and hand are normal. Lower extremities look normal muscle tone good pulses good sensation normal no deformities joints look reduced  Assessment/Plan: Right proximal humerus fracture 81 year old female she does use a walker to ambulate so there will be some difficulties related to that but nonoperative treatment advised  Arther Abbott 08/09/2014, 4:44 PM

## 2014-08-10 ENCOUNTER — Encounter (HOSPITAL_COMMUNITY): Payer: Self-pay | Admitting: Internal Medicine

## 2014-08-10 DIAGNOSIS — D696 Thrombocytopenia, unspecified: Secondary | ICD-10-CM | POA: Diagnosis not present

## 2014-08-10 DIAGNOSIS — I482 Chronic atrial fibrillation: Secondary | ICD-10-CM

## 2014-08-10 LAB — CBC
HEMATOCRIT: 28.7 % — AB (ref 36.0–46.0)
Hemoglobin: 9.1 g/dL — ABNORMAL LOW (ref 12.0–15.0)
MCH: 32 pg (ref 26.0–34.0)
MCHC: 31.7 g/dL (ref 30.0–36.0)
MCV: 101.1 fL — AB (ref 78.0–100.0)
Platelets: 139 10*3/uL — ABNORMAL LOW (ref 150–400)
RBC: 2.84 MIL/uL — ABNORMAL LOW (ref 3.87–5.11)
RDW: 14 % (ref 11.5–15.5)
WBC: 9.3 10*3/uL (ref 4.0–10.5)

## 2014-08-10 LAB — BASIC METABOLIC PANEL
Anion gap: 5 (ref 5–15)
BUN: 25 mg/dL — ABNORMAL HIGH (ref 6–23)
CALCIUM: 8.7 mg/dL (ref 8.4–10.5)
CO2: 32 mmol/L (ref 19–32)
Chloride: 105 mmol/L (ref 96–112)
Creatinine, Ser: 0.89 mg/dL (ref 0.50–1.10)
GFR calc non Af Amer: 52 mL/min — ABNORMAL LOW (ref >90)
GFR, EST AFRICAN AMERICAN: 60 mL/min — AB (ref >90)
Glucose, Bld: 130 mg/dL — ABNORMAL HIGH (ref 70–99)
Potassium: 4.7 mmol/L (ref 3.5–5.1)
SODIUM: 142 mmol/L (ref 135–145)

## 2014-08-10 MED ORDER — PROMETHAZINE HCL 25 MG PO TABS: 12.5000 mg | ORAL_TABLET | ORAL | Status: DC | PRN

## 2014-08-10 MED ORDER — ACETAMINOPHEN 325 MG PO TABS: 650.0000 mg | ORAL_TABLET | Freq: Three times a day (TID) | ORAL | Status: DC

## 2014-08-10 MED ORDER — ALPRAZOLAM 0.5 MG PO TABS: ORAL_TABLET | ORAL | Status: DC

## 2014-08-10 NOTE — Progress Notes (Addendum)
[facility] Patient is approved to go.

## 2014-08-10 NOTE — Progress Notes (Signed)
Utilization Review completed.  

## 2014-08-10 NOTE — Progress Notes (Addendum)
Patient oxygen saturation on room air 85%. On 2 liters by [place] patient 92%. Dr. Sherrie MustacheFisher paged to notify of patients oxygen status. [facility] and [facility] is [facility] available for patient. Will send discharge summary when available.

## 2014-08-10 NOTE — Discharge Summary (Addendum)
Physician Discharge Summary  CAROLEENA PAOLINI UJW:[id number] DOB: Dec 13, 1912 DOA: 08/09/2014  PCP: Pamelia Hoit, MD  Admit date: 08/09/2014 Discharge date: 08/10/2014  Time spent: Greater than 30 minutes  Recommendations for Outpatient Follow-up:  1. The left shoulder should be immobilized 3 weeks. 2. Call Dr. Mort Sawyers office for follow-up of the shoulder fracture to be seen in 3 weeks. 3. Recommend follow-up of the patient's CBC in 1 week. 4. Opiate pain medications cause the patient to have nausea and vomiting. 5.  Patient needs nasal cannula oxygen at 2 L/m-recommend humidifying the oxygen for comfort. 6. Patient is being discharged to [facility] skilled nursing facility.   Discharge Diagnoses:   1. Comminuted fracture of the proximal humerus, status post fall at home. 2. Probable acute respiratory failure with hypoxia. Prior to discharge, her oxygen saturation was 85% on room air and 92% with 2 L nasal cannula oxygen. Baseline oxygen saturation was unknown, but hypoxia likely secondary to pain and atelectasis. 3. Acute on chronic anemia, likely secondary to hemodilution from IV fluids. Aspirin discontinued. 4. Thrombocytopenia, likely secondary to hemodilution from IV fluids. Aspirin discontinued. 5. Nausea and vomiting, likely secondary to morphine and Vicodin. 6. Hypertension. Stable. 7. Chronic diastolic heart failure. Stable. 8. GERD. Stable. 9. Moderate protein calorie malnutrition. 10. Chronic atrial fibrillation, not on anticoagulation secondary to fall. Aspirin also discontinued secondary to fall risk, thrombocytopenia, and anemia. 11. Leukocytosis secondary to stress the margination. 12. DO NOT RESUSCITATE status.   Discharge Condition: Stable  Diet recommendation: Heart healthy  Filed Weights   08/09/14 0608 08/09/14 1346  Weight: 43.545 kg (96 lb) 44.1 kg (97 lb 3.6 oz)    History of present illness:  The patient is a 81 year old woman with a history of  atrial fibrillation, aortic stenosis, hypertension, and chronic diastolic heart failure, who presented to the emergency department on 08/09/14 after complaining of right shoulder pain from a fall at home. In the ED, she was transiently and minimally hypoxic with oxygen saturation of 89% on room air. She was mildly tachycardic and her blood pressure was moderately elevated. Her lab data were significant for W BC of 12.6, hemoglobin of 11.0. X-ray of her right shoulder revealed a calm unit fracture of the proximal humeral metaphysis within impaction; avulsion of the greater tuberosity; no dislocation. She was admitted for further evaluation and management.  Hospital Course:  The patient was started on oxygen therapy. She was placed in an arm sling Gentle IV fluids were started for supportive treatment as she was mildly tachycardic on admission. Analgesics were ordered with morphine and Vicodin as needed. However, when the patient received either one of these medications, she developed nausea and vomiting which was treated with Phenergan and/or Zofran. Therefore, her pain was treated with extra strength Tylenol. She was also maintained on Mobic. All of her chronic medications were continued, but the aspirin was discontinued when her hemoglobin and platelet count fell from hemodilution. There was no evidence of an active GI or GU bleed. However, aspirin was also discontinued because of her increased fall risk. She was continued on her PPI. For further evaluation of her hypoxia, a chest x-ray was ordered. The chest x-ray revealed cardiomegaly and hyperinflation, but no active cardiopulmonary disease. A urinalysis was also ordered and it revealed no evidence of acute infection. Her thyroid function was assessed and her TSH was within normal limits at 1.98. Orthopedic surgeon, Dr. Romeo Apple was consulted. He recommended that the shoulder be immobilized for 3 weeks. He advised  that the patient follow-up with him in 3  weeks for evaluation and for x-rays.  The patient remained stable. Physical therapy was consulted and recommended short-term skilled nursing facility placement which the family agreed with. During the course of the hospitalization, her oxygen saturation waxed and waned, but was 85% on room air and it increased to 92-95% on 2 L nasal cannula oxygen. Given her age and debilitation, no further diagnostic workup was entertained. The hypoxia was likely secondary to pain and atelectasis. All of her other chronic conditions remained stable on her chronic medications. She was discharged on standing dose Tylenol and Mobic for pain.   Procedures:  None  Consultations:  Orthopedics, Dr. Romeo AppleHarrison  Discharge Exam: Filed Vitals:   08/10/14 0612  BP: 157/50  Pulse: 82  Temp: 98.4 F (36.9 C)  Resp: 18   oxygen saturation 85-97% on room air; nasal cannula oxygen  General: Pleasant alert elderly 81 year old laying in bed, in no acute distress. Cardiovascular: S1, S2, with an ectopic beat versus irregular, irregular. Respiratory: Breathing nonlabored. Lungs are clear anteriorly with decreased breath sounds in the bases.  Discharge Instructions   Discharge Instructions    Diet - low sodium heart healthy    Complete by:  As directed      Increase activity slowly    Complete by:  As directed           Current Discharge Medication List    CONTINUE these medications which have CHANGED   Details  acetaminophen (TYLENOL) 325 MG tablet Take 2 tablets (650 mg total) by mouth 3 (three) times daily.    ALPRAZolam (XANAX) 0.5 MG tablet Take one tablet by mouth at bedtime to help rest Qty: 30 tablet, Refills: 0    promethazine (PHENERGAN) 25 MG tablet Take 0.5 tablets (12.5 mg total) by mouth every 4 (four) hours as needed for nausea or vomiting. Qty: 30 tablet, Refills: 0      CONTINUE these medications which have NOT CHANGED   Details  alum & mag hydroxide-simeth (MAALOX/MYLANTA) 200-200-20  MG/5ML suspension Take 30 mLs by mouth 2 (two) times daily as needed for indigestion.    amLODipine (NORVASC) 5 MG tablet Take 5 mg by mouth daily.    calcium carbonate (OS-CAL) 600 MG TABS Take 1,200 mg by mouth daily with breakfast.     carvedilol (COREG) 3.125 MG tablet Take 1 tablet (3.125 mg total) by mouth 2 (two) times daily. Qty: 180 tablet, Refills: 3    esomeprazole (NEXIUM) 40 MG capsule Take 40 mg by mouth daily.     halobetasol (ULTRAVATE) 0.05 % cream Apply 1 application topically daily.    loratadine (CLARITIN) 10 MG tablet Take 10 mg by mouth daily.    meloxicam (MOBIC) 7.5 MG tablet Take 1 tablet by mouth 2 (two) times daily. Refills: 4    Multiple Vitamin (MULTIVITAMIN WITH MINERALS) TABS Take 1 tablet by mouth daily.    Vitamin D, Ergocalciferol, (DRISDOL) 50000 UNITS CAPS capsule Take 50,000 Units by mouth every 7 (seven) days. Take on Friday.    docusate sodium (COLACE) 100 MG capsule Take 1 capsule (100 mg total) by mouth 2 (two) times daily. Continue this while taking narcotics to help with bowel movements Qty: 30 capsule, Refills: 1    feeding supplement (GLUCERNA SHAKE) LIQD Take 237 mLs by mouth 3 (three) times daily between meals.     ferrous sulfate 325 (65 FE) MG tablet Take 1 tablet (325 mg total) by mouth 2 (two) times  daily with a meal. Refills: 3      STOP taking these medications     aspirin EC 325 MG EC tablet      furosemide (LASIX) 20 MG tablet        Allergies  Allergen Reactions  . Amoxicillin Other (See Comments)    unknown  . Doxycycline Other (See Comments)    unknown  . Fosamax [Alendronate] Other (See Comments)    unknown  . Septra [Sulfamethoxazole-Trimethoprim] Other (See Comments)    unknown  . Tramadol     disoriented   Follow-up Information    Follow up with Fuller Canada, MD. Schedule an appointment as soon as possible for a visit in 3 weeks.   Specialties:  Orthopedic Surgery, Radiology   Contact information:    [address] [place] [facility] [address] [phone]        The results of significant diagnostics from this hospitalization (including imaging, microbiology, ancillary and laboratory) are listed below for reference.    Significant Diagnostic Studies: Dg Shoulder Right  08/09/2014   CLINICAL DATA:  Pain following fall getting out of bed  EXAM: RIGHT SHOULDER - 2+ VIEW  COMPARISON:  None.  FINDINGS: Frontal, Y scapular, and oblique views were obtained. There is a comminuted fracture of the proximal humeral metaphysis with impaction at the fracture site. There is avulsion and greater tuberosity region. No other fractures. No dislocation. Moderate generalized osteoarthritic change present.  IMPRESSION: Comminuted fracture proximal humeral metaphysis with impaction. Avulsion greater tuberosity. No dislocation.   Electronically Signed   By: Bretta Bang M.D.   On: 08/09/2014 07:01   Dg Chest Port 1 View  08/09/2014   CLINICAL DATA:  Fall, hypertension.  EXAM: CHEST  1 VIEW  COMPARISON:  12/30/2013  FINDINGS: Cardiomegaly. No confluent airspace opacities or effusions. Hyperinflation. No edema.  There is a fracture through the right humeral neck and greater tuberosity.  IMPRESSION: Cardiomegaly, hyperinflation.  No active cardiopulmonary disease.  Right humeral neck/greater tuberosity fracture.   Electronically Signed   By: Charlett Nose M.D.   On: 08/09/2014 11:25   Dg Humerus Right  08/09/2014   CLINICAL DATA:  Pain following fall getting out of bed  EXAM: RIGHT HUMERUS - 2+ VIEW  COMPARISON:  None.  FINDINGS: Frontal and lateral views were obtained. There is a comminuted fracture of the proximal humeral metaphysis with impaction at the fracture site. There is avulsion of the greater tuberosity. No other fractures. No dislocation. Bones are osteoporotic.  IMPRESSION: Comminuted fracture proximal humeral metaphysis with avulsion of the greater tuberosity. Impaction is noted at fracture  site. No dislocation. Bones osteoporotic.   Electronically Signed   By: Bretta Bang M.D.   On: 08/09/2014 07:02    Microbiology: No results found for this or any previous visit (from the past 240 hour(s)).   Labs: Basic Metabolic Panel:  Recent Labs Lab 08/09/14 0938 08/10/14 0637  NA 139 142  K 3.8 4.7  CL 101 105  CO2 36* 32  GLUCOSE 133* 130*  BUN 16 25*  CREATININE 0.56 0.89  CALCIUM 8.7 8.7   Liver Function Tests: No results for input(s): AST, ALT, ALKPHOS, BILITOT, PROT, ALBUMIN in the last 168 hours. No results for input(s): LIPASE, AMYLASE in the last 168 hours. No results for input(s): AMMONIA in the last 168 hours. CBC:  Recent Labs Lab 08/09/14 0938 08/10/14 0637  WBC 12.6* 9.3  NEUTROABS 11.2*  --   HGB 11.0* 9.1*  HCT 34.1*  28.7*  MCV 98.0 101.1*  PLT 162 139*   Cardiac Enzymes: No results for input(s): CKTOTAL, CKMB, CKMBINDEX, TROPONINI in the last 168 hours. BNP: BNP (last 3 results) No results for input(s): BNP in the last 8760 hours.  ProBNP (last 3 results)  Recent Labs  01/02/14 1212  PROBNP 456.0*    CBG: No results for input(s): GLUCAP in the last 168 hours.     Signed:  Brix Brearley  [facility] 08/10/2014, 1:07 PM

## 2014-08-10 NOTE — Progress Notes (Signed)
Report called to [facility]phanie [facility].

## 2014-08-11 ENCOUNTER — Inpatient Hospital Stay (HOSPITAL_COMMUNITY)
Admission: EM | Admit: 2014-08-11 | Discharge: 2014-08-14 | DRG: 193 | Disposition: A | Discharge status: Skilled Nursing Facility | Payer: Medicare Other | Attending: Internal Medicine | Admitting: Internal Medicine

## 2014-08-11 ENCOUNTER — Emergency Department (HOSPITAL_COMMUNITY): Payer: Medicare Other

## 2014-08-11 ENCOUNTER — Encounter (HOSPITAL_COMMUNITY): Payer: Self-pay

## 2014-08-11 DIAGNOSIS — J189 Pneumonia, unspecified organism: Secondary | ICD-10-CM | POA: Diagnosis present

## 2014-08-11 DIAGNOSIS — R112 Nausea with vomiting, unspecified: Secondary | ICD-10-CM | POA: Diagnosis present

## 2014-08-11 DIAGNOSIS — Z66 Do not resuscitate: Secondary | ICD-10-CM | POA: Diagnosis present

## 2014-08-11 DIAGNOSIS — I482 Chronic atrial fibrillation: Secondary | ICD-10-CM | POA: Diagnosis present

## 2014-08-11 DIAGNOSIS — R296 Repeated falls: Secondary | ICD-10-CM | POA: Diagnosis present

## 2014-08-11 DIAGNOSIS — Z87891 Personal history of nicotine dependence: Secondary | ICD-10-CM | POA: Diagnosis not present

## 2014-08-11 DIAGNOSIS — Z881 Allergy status to other antibiotic agents status: Secondary | ICD-10-CM | POA: Diagnosis not present

## 2014-08-11 DIAGNOSIS — I350 Nonrheumatic aortic (valve) stenosis: Secondary | ICD-10-CM | POA: Diagnosis present

## 2014-08-11 DIAGNOSIS — Z888 Allergy status to other drugs, medicaments and biological substances status: Secondary | ICD-10-CM

## 2014-08-11 DIAGNOSIS — I10 Essential (primary) hypertension: Secondary | ICD-10-CM | POA: Diagnosis present

## 2014-08-11 DIAGNOSIS — K219 Gastro-esophageal reflux disease without esophagitis: Secondary | ICD-10-CM | POA: Diagnosis present

## 2014-08-11 DIAGNOSIS — M810 Age-related osteoporosis without current pathological fracture: Secondary | ICD-10-CM | POA: Diagnosis present

## 2014-08-11 DIAGNOSIS — I951 Orthostatic hypotension: Secondary | ICD-10-CM | POA: Diagnosis present

## 2014-08-11 DIAGNOSIS — Z882 Allergy status to sulfonamides status: Secondary | ICD-10-CM | POA: Diagnosis not present

## 2014-08-11 DIAGNOSIS — S42301A Unspecified fracture of shaft of humerus, right arm, initial encounter for closed fracture: Secondary | ICD-10-CM | POA: Insufficient documentation

## 2014-08-11 DIAGNOSIS — E43 Unspecified severe protein-calorie malnutrition: Secondary | ICD-10-CM | POA: Diagnosis present

## 2014-08-11 DIAGNOSIS — J9601 Acute respiratory failure with hypoxia: Secondary | ICD-10-CM | POA: Diagnosis present

## 2014-08-11 DIAGNOSIS — Z9842 Cataract extraction status, left eye: Secondary | ICD-10-CM

## 2014-08-11 DIAGNOSIS — M1990 Unspecified osteoarthritis, unspecified site: Secondary | ICD-10-CM | POA: Diagnosis present

## 2014-08-11 DIAGNOSIS — R52 Pain, unspecified: Secondary | ICD-10-CM

## 2014-08-11 DIAGNOSIS — F0390 Unspecified dementia without behavioral disturbance: Secondary | ICD-10-CM | POA: Diagnosis present

## 2014-08-11 DIAGNOSIS — Z9841 Cataract extraction status, right eye: Secondary | ICD-10-CM | POA: Diagnosis not present

## 2014-08-11 DIAGNOSIS — R0600 Dyspnea, unspecified: Secondary | ICD-10-CM

## 2014-08-11 DIAGNOSIS — S42309A Unspecified fracture of shaft of humerus, unspecified arm, initial encounter for closed fracture: Secondary | ICD-10-CM | POA: Diagnosis present

## 2014-08-11 DIAGNOSIS — Y95 Nosocomial condition: Secondary | ICD-10-CM | POA: Diagnosis present

## 2014-08-11 DIAGNOSIS — S42301D Unspecified fracture of shaft of humerus, right arm, subsequent encounter for fracture with routine healing: Secondary | ICD-10-CM

## 2014-08-11 DIAGNOSIS — M25511 Pain in right shoulder: Secondary | ICD-10-CM

## 2014-08-11 DIAGNOSIS — D649 Anemia, unspecified: Secondary | ICD-10-CM | POA: Diagnosis present

## 2014-08-11 DIAGNOSIS — Z7901 Long term (current) use of anticoagulants: Secondary | ICD-10-CM

## 2014-08-11 DIAGNOSIS — S42309D Unspecified fracture of shaft of humerus, unspecified arm, subsequent encounter for fracture with routine healing: Secondary | ICD-10-CM

## 2014-08-11 DIAGNOSIS — R0902 Hypoxemia: Secondary | ICD-10-CM | POA: Diagnosis present

## 2014-08-11 DIAGNOSIS — Z9049 Acquired absence of other specified parts of digestive tract: Secondary | ICD-10-CM | POA: Diagnosis present

## 2014-08-11 DIAGNOSIS — Z8673 Personal history of transient ischemic attack (TIA), and cerebral infarction without residual deficits: Secondary | ICD-10-CM | POA: Diagnosis not present

## 2014-08-11 DIAGNOSIS — Z681 Body mass index (BMI) 19 or less, adult: Secondary | ICD-10-CM

## 2014-08-11 DIAGNOSIS — S42211A Unspecified displaced fracture of surgical neck of right humerus, initial encounter for closed fracture: Secondary | ICD-10-CM | POA: Diagnosis present

## 2014-08-11 DIAGNOSIS — R0602 Shortness of breath: Secondary | ICD-10-CM

## 2014-08-11 DIAGNOSIS — I214 Non-ST elevation (NSTEMI) myocardial infarction: Secondary | ICD-10-CM

## 2014-08-11 DIAGNOSIS — R7989 Other specified abnormal findings of blood chemistry: Secondary | ICD-10-CM

## 2014-08-11 DIAGNOSIS — D696 Thrombocytopenia, unspecified: Secondary | ICD-10-CM | POA: Diagnosis present

## 2014-08-11 DIAGNOSIS — I5032 Chronic diastolic (congestive) heart failure: Secondary | ICD-10-CM | POA: Diagnosis present

## 2014-08-11 DIAGNOSIS — R778 Other specified abnormalities of plasma proteins: Secondary | ICD-10-CM | POA: Diagnosis present

## 2014-08-11 DIAGNOSIS — J9811 Atelectasis: Secondary | ICD-10-CM | POA: Diagnosis present

## 2014-08-11 DIAGNOSIS — Z9981 Dependence on supplemental oxygen: Secondary | ICD-10-CM | POA: Diagnosis not present

## 2014-08-11 DIAGNOSIS — E873 Alkalosis: Secondary | ICD-10-CM | POA: Diagnosis present

## 2014-08-11 DIAGNOSIS — I509 Heart failure, unspecified: Secondary | ICD-10-CM

## 2014-08-11 HISTORY — DX: Repeated falls: R29.6

## 2014-08-11 HISTORY — DX: Anemia, unspecified: D64.9

## 2014-08-11 HISTORY — DX: Thrombocytopenia, unspecified: D69.6

## 2014-08-11 HISTORY — DX: Dependence on supplemental oxygen: Z99.81

## 2014-08-11 HISTORY — DX: Unspecified protein-calorie malnutrition: E46

## 2014-08-11 HISTORY — DX: Unspecified dementia without behavioral disturbance: F03.90

## 2014-08-11 HISTORY — DX: Unspecified dementia, mild, without behavioral disturbance, psychotic disturbance, mood disturbance, and anxiety: F03.A0

## 2014-08-11 HISTORY — DX: Do not resuscitate: Z66

## 2014-08-11 LAB — CBC WITH DIFFERENTIAL/PLATELET
BASOS ABS: 0 10*3/uL (ref 0.0–0.1)
BASOS PCT: 0 % (ref 0–1)
EOS PCT: 0 % (ref 0–5)
Eosinophils Absolute: 0 10*3/uL (ref 0.0–0.7)
HCT: 31.8 % — ABNORMAL LOW (ref 36.0–46.0)
Hemoglobin: 10.3 g/dL — ABNORMAL LOW (ref 12.0–15.0)
LYMPHS ABS: 0.6 10*3/uL — AB (ref 0.7–4.0)
LYMPHS PCT: 5 % — AB (ref 12–46)
MCH: 32.2 pg (ref 26.0–34.0)
MCHC: 32.4 g/dL (ref 30.0–36.0)
MCV: 99.4 fL (ref 78.0–100.0)
Monocytes Absolute: 0.5 10*3/uL (ref 0.1–1.0)
Monocytes Relative: 4 % (ref 3–12)
Neutro Abs: 10.6 10*3/uL — ABNORMAL HIGH (ref 1.7–7.7)
Neutrophils Relative %: 91 % — ABNORMAL HIGH (ref 43–77)
Platelets: 147 10*3/uL — ABNORMAL LOW (ref 150–400)
RBC: 3.2 MIL/uL — ABNORMAL LOW (ref 3.87–5.11)
RDW: 13.6 % (ref 11.5–15.5)
WBC: 11.7 10*3/uL — AB (ref 4.0–10.5)

## 2014-08-11 LAB — URINE MICROSCOPIC-ADD ON

## 2014-08-11 LAB — COMPREHENSIVE METABOLIC PANEL
ALT: 15 U/L (ref 0–35)
AST: 18 U/L (ref 0–37)
Albumin: 3.6 g/dL (ref 3.5–5.2)
Alkaline Phosphatase: 62 U/L (ref 39–117)
Anion gap: 5 (ref 5–15)
BUN: 26 mg/dL — AB (ref 6–23)
CHLORIDE: 97 mmol/L (ref 96–112)
CO2: 37 mmol/L — ABNORMAL HIGH (ref 19–32)
CREATININE: 0.65 mg/dL (ref 0.50–1.10)
Calcium: 9 mg/dL (ref 8.4–10.5)
GFR calc Af Amer: 82 mL/min — ABNORMAL LOW (ref >90)
GFR calc non Af Amer: 71 mL/min — ABNORMAL LOW (ref >90)
GLUCOSE: 168 mg/dL — AB (ref 70–99)
Potassium: 3.9 mmol/L (ref 3.5–5.1)
SODIUM: 139 mmol/L (ref 135–145)
TOTAL PROTEIN: 6.9 g/dL (ref 6.0–8.3)
Total Bilirubin: 1.2 mg/dL (ref 0.3–1.2)

## 2014-08-11 LAB — LACTIC ACID, PLASMA: Lactic Acid, Venous: 0.9 mmol/L (ref 0.5–2.0)

## 2014-08-11 LAB — URINALYSIS, ROUTINE W REFLEX MICROSCOPIC
BILIRUBIN URINE: NEGATIVE
Glucose, UA: NEGATIVE mg/dL
Ketones, ur: 40 mg/dL — AB
LEUKOCYTES UA: NEGATIVE
Nitrite: NEGATIVE
PROTEIN: NEGATIVE mg/dL
SPECIFIC GRAVITY, URINE: 1.02 (ref 1.005–1.030)
Urobilinogen, UA: 0.2 mg/dL (ref 0.0–1.0)
pH: 6 (ref 5.0–8.0)

## 2014-08-11 LAB — BRAIN NATRIURETIC PEPTIDE: B NATRIURETIC PEPTIDE 5: 879 pg/mL — AB (ref 0.0–100.0)

## 2014-08-11 LAB — TROPONIN I
TROPONIN I: 0.05 ng/mL — AB (ref <0.031)
Troponin I: 0.05 ng/mL — ABNORMAL HIGH (ref <0.031)

## 2014-08-11 LAB — LIPASE, BLOOD: Lipase: 18 U/L (ref 11–59)

## 2014-08-11 MED ORDER — LORATADINE 10 MG PO TABS: 10.0000 mg | ORAL_TABLET | Freq: Every day | ORAL | Status: DC

## 2014-08-11 MED ORDER — VITAMIN D (ERGOCALCIFEROL) 1.25 MG (50000 UNIT) PO CAPS
50000.0000 [IU] | ORAL_CAPSULE | ORAL | Status: DC
  Filled 2014-08-11: qty 1

## 2014-08-11 MED ORDER — DOCUSATE SODIUM 100 MG PO CAPS
100.0000 mg | ORAL_CAPSULE | Freq: Two times a day (BID) | ORAL | Status: DC
  Administered 2014-08-11 – 2014-08-14 (×5): 100 mg via ORAL
  Filled 2014-08-11 (×6): qty 1

## 2014-08-11 MED ORDER — CALCIUM CARBONATE 1250 (500 CA) MG PO TABS
1250.0000 mg | ORAL_TABLET | Freq: Every day | ORAL | Status: DC
  Administered 2014-08-12 – 2014-08-14 (×3): 1250 mg via ORAL
  Filled 2014-08-11 (×3): qty 3

## 2014-08-11 MED ORDER — ENOXAPARIN SODIUM 30 MG/0.3ML ~~LOC~~ SOLN
30.0000 mg | SUBCUTANEOUS | Status: DC
  Administered 2014-08-11 – 2014-08-12 (×2): 30 mg via SUBCUTANEOUS
  Filled 2014-08-11 (×3): qty 0.3

## 2014-08-11 MED ORDER — VANCOMYCIN HCL 500 MG IV SOLR
500.0000 mg | Freq: Once | INTRAVENOUS | Status: DC
  Filled 2014-08-11: qty 500

## 2014-08-11 MED ORDER — CARVEDILOL 3.125 MG PO TABS
3.1250 mg | ORAL_TABLET | Freq: Two times a day (BID) | ORAL | Status: DC
  Administered 2014-08-11 – 2014-08-14 (×6): 3.125 mg via ORAL
  Filled 2014-08-11 (×6): qty 1

## 2014-08-11 MED ORDER — HYDROCODONE-ACETAMINOPHEN 5-325 MG PO TABS: 1.0000 | ORAL_TABLET | ORAL | Status: DC | PRN

## 2014-08-11 MED ORDER — ACETAMINOPHEN 325 MG PO TABS
650.0000 mg | ORAL_TABLET | Freq: Three times a day (TID) | ORAL | Status: DC
  Administered 2014-08-11 – 2014-08-14 (×8): 650 mg via ORAL
  Filled 2014-08-11 (×8): qty 2

## 2014-08-11 MED ORDER — HALOBETASOL PROPIONATE 0.05 % EX CREA: 1.0000 "application " | TOPICAL_CREAM | Freq: Every day | CUTANEOUS | Status: DC

## 2014-08-11 MED ORDER — AMLODIPINE BESYLATE 5 MG PO TABS: 5.0000 mg | ORAL_TABLET | Freq: Every day | ORAL | Status: DC

## 2014-08-11 MED ORDER — AMLODIPINE BESYLATE 5 MG PO TABS
5.0000 mg | ORAL_TABLET | Freq: Every day | ORAL | Status: DC
  Administered 2014-08-11 – 2014-08-14 (×4): 5 mg via ORAL
  Filled 2014-08-11 (×4): qty 1

## 2014-08-11 MED ORDER — MORPHINE SULFATE 2 MG/ML IJ SOLN
1.0000 mg | INTRAMUSCULAR | Status: DC | PRN
  Administered 2014-08-11: 1 mg via INTRAVENOUS
  Filled 2014-08-11: qty 1

## 2014-08-11 MED ORDER — ALPRAZOLAM 0.5 MG PO TABS
0.5000 mg | ORAL_TABLET | Freq: Every day | ORAL | Status: DC
  Administered 2014-08-11 – 2014-08-13 (×3): 0.5 mg via ORAL
  Filled 2014-08-11 (×3): qty 1

## 2014-08-11 MED ORDER — LEVOFLOXACIN IN D5W 500 MG/100ML IV SOLN: 500.0000 mg | INTRAVENOUS | Status: DC

## 2014-08-11 MED ORDER — ADULT MULTIVITAMIN W/MINERALS CH
1.0000 | ORAL_TABLET | Freq: Every day | ORAL | Status: DC
  Administered 2014-08-12 – 2014-08-14 (×3): 1 via ORAL
  Filled 2014-08-11 (×3): qty 1

## 2014-08-11 MED ORDER — GLUCERNA SHAKE PO LIQD
237.0000 mL | Freq: Three times a day (TID) | ORAL | Status: DC
  Administered 2014-08-11 – 2014-08-14 (×7): 237 mL via ORAL
  Filled 2014-08-11: qty 237

## 2014-08-11 MED ORDER — LEVOFLOXACIN IN D5W 750 MG/150ML IV SOLN
750.0000 mg | INTRAVENOUS | Status: DC
  Administered 2014-08-11 – 2014-08-13 (×2): 750 mg via INTRAVENOUS
  Filled 2014-08-11 (×3): qty 150

## 2014-08-11 MED ORDER — ADULT MULTIVITAMIN W/MINERALS CH: 1.0000 | ORAL_TABLET | Freq: Every day | ORAL | Status: DC

## 2014-08-11 MED ORDER — PANTOPRAZOLE SODIUM 40 MG PO TBEC
40.0000 mg | DELAYED_RELEASE_TABLET | Freq: Every day | ORAL | Status: DC
  Administered 2014-08-11 – 2014-08-13 (×3): 40 mg via ORAL
  Filled 2014-08-11 (×3): qty 1

## 2014-08-11 MED ORDER — FUROSEMIDE 10 MG/ML IJ SOLN
20.0000 mg | Freq: Once | INTRAMUSCULAR | Status: AC
  Administered 2014-08-11: 20 mg via INTRAVENOUS
  Filled 2014-08-11: qty 2

## 2014-08-11 MED ORDER — SODIUM CHLORIDE 0.9 % IJ SOLN
3.0000 mL | Freq: Two times a day (BID) | INTRAMUSCULAR | Status: DC
  Administered 2014-08-11 – 2014-08-14 (×5): 3 mL via INTRAVENOUS

## 2014-08-11 MED ORDER — FERROUS SULFATE 325 (65 FE) MG PO TABS
325.0000 mg | ORAL_TABLET | Freq: Two times a day (BID) | ORAL | Status: DC
  Administered 2014-08-11 – 2014-08-14 (×6): 325 mg via ORAL
  Filled 2014-08-11 (×6): qty 1

## 2014-08-11 MED ORDER — ONDANSETRON HCL 4 MG PO TABS: 4.0000 mg | ORAL_TABLET | Freq: Four times a day (QID) | ORAL | Status: DC | PRN

## 2014-08-11 MED ORDER — ENOXAPARIN SODIUM 30 MG/0.3ML ~~LOC~~ SOLN: 30.0000 mg | SUBCUTANEOUS | Status: DC

## 2014-08-11 MED ORDER — ACETAMINOPHEN 325 MG PO TABS: 650.0000 mg | ORAL_TABLET | Freq: Three times a day (TID) | ORAL | Status: DC

## 2014-08-11 MED ORDER — SODIUM CHLORIDE 0.9 % IV SOLN: INTRAVENOUS | Status: DC

## 2014-08-11 MED ORDER — DEXTROSE 5 % IV SOLN
2.0000 g | Freq: Once | INTRAVENOUS | Status: AC
  Administered 2014-08-11: 2 g via INTRAVENOUS
  Filled 2014-08-11: qty 2

## 2014-08-11 MED ORDER — PANTOPRAZOLE SODIUM 40 MG PO TBEC: 40.0000 mg | DELAYED_RELEASE_TABLET | Freq: Every day | ORAL | Status: DC

## 2014-08-11 MED ORDER — ALUM & MAG HYDROXIDE-SIMETH 200-200-20 MG/5ML PO SUSP: 30.0000 mL | Freq: Two times a day (BID) | ORAL | Status: DC | PRN

## 2014-08-11 MED ORDER — PROMETHAZINE HCL 12.5 MG PO TABS: 12.5000 mg | ORAL_TABLET | ORAL | Status: DC | PRN

## 2014-08-11 MED ORDER — LORATADINE 10 MG PO TABS
10.0000 mg | ORAL_TABLET | Freq: Every day | ORAL | Status: DC
  Administered 2014-08-12 – 2014-08-14 (×3): 10 mg via ORAL
  Filled 2014-08-11 (×3): qty 1

## 2014-08-11 MED ORDER — ONDANSETRON HCL 4 MG/2ML IJ SOLN
4.0000 mg | Freq: Four times a day (QID) | INTRAMUSCULAR | Status: DC | PRN
  Administered 2014-08-12: 4 mg via INTRAVENOUS
  Filled 2014-08-11 (×3): qty 2

## 2014-08-11 NOTE — Progress Notes (Signed)
81yo F admitted with PNA. Levaquin ordered on admission.  Renal function is at patient's baseline.  Estimated CrCl ~25-4430ml/min. Levaquin dose adjusted to 750mg  IV q48h for patient's renal function based on manufacturer dose recommendations for indication/ renal function.   Junita PushMichelle Prakash Kimberling, PharmD, BCPS 08/11/2014@6 :37 PM

## 2014-08-11 NOTE — Progress Notes (Signed)
ANTIBIOTIC CONSULT NOTE  Pharmacy Consult for Vancomycin Indication: pneumonia  Allergies  Allergen Reactions  . Amoxicillin Other (See Comments)    unknown  . Doxycycline Other (See Comments)    unknown  . Fosamax [Alendronate] Other (See Comments)    unknown  . Septra [Sulfamethoxazole-Trimethoprim] Other (See Comments)    unknown  . Tramadol     disoriented    Patient Measurements:   Last Recorded Body Weight: 44.1kg  Vital Signs: Temp: 97.5 F (36.4 C) (02/07 1517) Temp Source: Oral (02/07 1517) BP: 167/62 mmHg (02/07 1700) Pulse Rate: 92 (02/07 1700) Intake/Output from previous day:   Intake/Output from this shift:    Labs:  Recent Labs  08/09/14 0938 08/10/14 0637 08/11/14 1530  WBC 12.6* 9.3 11.7*  HGB 11.0* 9.1* 10.3*  PLT 162 139* 147*  CREATININE 0.56 0.89 0.65   Estimated Creatinine Clearance: 26.7 mL/min (by C-G formula based on Cr of 0.65). No results for input(s): VANCOTROUGH, VANCOPEAK, VANCORANDOM, GENTTROUGH, GENTPEAK, GENTRANDOM, TOBRATROUGH, TOBRAPEAK, TOBRARND, AMIKACINPEAK, AMIKACINTROU, AMIKACIN in the last 72 hours.   Microbiology: No results found for this or any previous visit (from the past 720 hour(s)).  Anti-infectives    Start     Dose/Rate Route Frequency Ordered Stop   08/11/14 1715  aztreonam (AZACTAM) 2 g in dextrose 5 % 50 mL IVPB     2 g100 mL/hr over 30 Minutes Intravenous  Once 08/11/14 1700     08/11/14 1715  vancomycin (VANCOCIN) 500 mg in sodium chloride 0.9 % 100 mL IVPB     500 mg100 mL/hr over 60 Minutes Intravenous  Once 08/11/14 1706        Assessment: Vancomycin ordered by EDP for HCAP.  Goal of Therapy:  Vancomycin trough level 15-20 mcg/ml  Plan:  Vancomycin 500mg  IV x1 dose F/U admission orders  Elson ClanLilliston, Carisa Backhaus Michelle 08/11/2014,5:07 PM

## 2014-08-11 NOTE — H&P (Addendum)
[facility] History and Physical  Briana Schultz JXB:[id number] DOB: 10/24/1912 DOA: 08/11/2014  Referring physician: ER PCP: Pamelia HoitWILSON,FRED HENRY, MD   Chief Complaint: Hypoxia  HPI: Briana AdolphHallie M Labriola is a 81 y.o. female  This is a 81 year old lady who was discharged from the hospital yesterday. She had been admitted with hypoxia which was felt to be due to atelectasis and also had a humerus fracture. She was discharged to skilled nursing facility yesterday. She returns now because of worsening hypoxia and shallow respirations. The patient denies cough or fever. There is no nausea, vomiting, abdominal pain, chest pain, palpitations. Evaluation in the emergency room shows a chest x-ray which is consistent with either pulmonary edema or or atelectasis. There is a suggestion of a possibility of infective source. She is now being admitted for further management.   Review of Systems:  Apart from symptoms above, all systems negative.  Past Medical History  Diagnosis Date  . Fall     secondary to weakness as of  [place] OV  03/26/10  . H/O orthostatic hypotension   . Chest discomfort     anterior  . Hypertension   . Aortic valve stenosis     mild to moderate  . Syncope and collapse 2006  . Arthritis   . GERD (gastroesophageal reflux disease)   . Osteoporosis   . Abnormal EKG   . Scarlet fever   . Scoliosis   . TIA (transient ischemic attack) 1999  . Fracture, humerus     2016  . A-fib     02/2014, not on anticoagulation   . Diastolic CHF 01/30/2014  . Proximal humerus fracture   . On home O2 08/2014    2L N/C  . Anemia   . Thrombocytopenia   . DNR (do not resuscitate) 08/2014  . Malnutrition   . Frequent falls   . Mild dementia    Past Surgical History  Procedure Laterality Date  . Cholecystectomy    . Cataract extraction Bilateral   . Femur im nail Right 12/17/2013    Procedure: INTRAMEDULLARY (IM) NAIL FEMORAL;  Surgeon: Sheral Apleyimothy D Murphy, MD;  Location: [facility] OR;  Service:  Orthopedics;  Laterality: Right;   Social History:  reports that she quit smoking about 33 years ago. She has never used smokeless tobacco. She reports that she does not drink alcohol or use illicit drugs.  Allergies  Allergen Reactions  . Amoxicillin Other (See Comments)    unknown  . Doxycycline Other (See Comments)    unknown  . Fosamax [Alendronate] Other (See Comments)    unknown  . Septra [Sulfamethoxazole-Trimethoprim] Other (See Comments)    unknown  . Tramadol     disoriented    Family History  Problem Relation Age of Onset  . Pneumonia Mother   . Heart failure Father   . Diabetes Father   . Heart disease Sister       Prior to Admission medications   Medication Sig Start Date End Date Taking? Authorizing Provider  acetaminophen (TYLENOL) 325 MG tablet Take 2 tablets (650 mg total) by mouth 3 (three) times daily. 08/10/14  Yes Elliot Cousinenise Fisher, MD  ALPRAZolam Prudy Feeler(XANAX) 0.5 MG tablet Take one tablet by mouth at bedtime to help rest Patient taking differently: Take 0.5 mg by mouth at bedtime. Take one tablet by mouth at bedtime to help rest 08/10/14  Yes Elliot Cousinenise Fisher, MD  alum & mag hydroxide-simeth (MAALOX/MYLANTA) 200-200-20 MG/5ML suspension Take 30 mLs by mouth 2 (two) times daily as  needed for indigestion.   Yes Historical Provider, MD  amLODipine (NORVASC) 5 MG tablet Take 5 mg by mouth daily.   Yes Historical Provider, MD  calcium carbonate (OS-CAL) 600 MG TABS Take 1,200 mg by mouth daily with breakfast.    Yes Historical Provider, MD  carvedilol (COREG) 3.125 MG tablet Take 1 tablet (3.125 mg total) by mouth 2 (two) times daily. 02/04/14  Yes Antoine Poche, MD  docusate sodium (COLACE) 100 MG capsule Take 1 capsule (100 mg total) by mouth 2 (two) times daily. Continue this while taking narcotics to help with bowel movements 12/17/13  Yes Sheral Apley, MD  esomeprazole (NEXIUM) 40 MG capsule Take 40 mg by mouth daily.    Yes Historical Provider, MD  feeding supplement  (GLUCERNA SHAKE) LIQD Take 237 mLs by mouth 3 (three) times daily between meals.  01/19/12  Yes Vassie Loll, MD  ferrous sulfate 325 (65 FE) MG tablet Take 1 tablet (325 mg total) by mouth 2 (two) times daily with a meal. 12/19/13  Yes Calvert Cantor, MD  halobetasol (ULTRAVATE) 0.05 % cream Apply 1 application topically daily.   Yes Historical Provider, MD  loratadine (CLARITIN) 10 MG tablet Take 10 mg by mouth daily.   Yes Historical Provider, MD  Multiple Vitamin (MULTIVITAMIN WITH MINERALS) TABS Take 1 tablet by mouth daily.   Yes Historical Provider, MD  Vitamin D, Ergocalciferol, (DRISDOL) 50000 UNITS CAPS capsule Take 50,000 Units by mouth every 7 (seven) days. Take on Friday.   Yes Historical Provider, MD  meloxicam (MOBIC) 7.5 MG tablet Take 1 tablet by mouth 2 (two) times daily. 07/12/14   Historical Provider, MD  promethazine (PHENERGAN) 25 MG tablet Take 0.5 tablets (12.5 mg total) by mouth every 4 (four) hours as needed for nausea or vomiting. 08/10/14   Elliot Cousin, MD   Physical Exam: Filed Vitals:   08/11/14 1600 08/11/14 1630 08/11/14 1700 08/11/14 1720  BP: 175/59 166/58 167/62 167/62  Pulse: 79 86 92 82  Temp:    98.1 F (36.7 C)  TempSrc:    Oral  Resp: 33 32 25 27  SpO2: 88% 93% 93% 92%    Wt Readings from Last 3 Encounters:  08/09/14 44.1 kg (97 lb 3.6 oz)  02/06/14 43.273 kg (95 lb 6.4 oz)  02/04/14 46.267 kg (102 lb)    General:  Appears Frail, lying in bed. Eyes: PERRL, normal lids, irises & conjunctiva ENT: grossly normal hearing, lips & tongue Neck: no LAD, masses or thyromegaly Cardiovascular: RRR, no m/r/g. No LE edema. Telemetry: SR, no arrhythmias  Respiratpoor air entry bilaterally at bases. No crackles, wheezing or bronchial breathing. Abdomen: soft, ntnd Skin: no rash or induration seen on limited exam Musculoskeletal: grossly normal tone BUE/BLE Psychiatric: grossly normal mood and affect, speech fluent and appropriate Neurologic: grossly  non-focal.          Labs on Admission:  Basic Metabolic Panel:  Recent Labs Lab 08/09/14 0938 08/10/14 0637 08/11/14 1530  NA 139 142 139  K 3.8 4.7 3.9  CL 101 105 97  CO2 36* 32 37*  GLUCOSE 133* 130* 168*  BUN 16 25* 26*  CREATININE 0.56 0.89 0.65  CALCIUM 8.7 8.7 9.0   Liver Function Tests:  Recent Labs Lab 08/11/14 1530  AST 18  ALT 15  ALKPHOS 62  BILITOT 1.2  PROT 6.9  ALBUMIN 3.6    Recent Labs Lab 08/11/14 1530  LIPASE 18   No results for input(s): AMMONIA in  the last 168 hours. CBC:  Recent Labs Lab 08/09/14 0938 08/10/14 0637 08/11/14 1530  WBC 12.6* 9.3 11.7*  NEUTROABS 11.2*  --  10.6*  HGB 11.0* 9.1* 10.3*  HCT 34.1* 28.7* 31.8*  MCV 98.0 101.1* 99.4  PLT 162 139* 147*   Cardiac Enzymes:  Recent Labs Lab 08/11/14 1530  TROPONINI 0.05*    BNP (last 3 results)  Recent Labs  08/11/14 1530  BNP 879.0*    ProBNP (last 3 results)  Recent Labs  01/02/14 1212  PROBNP 456.0*    CBG: No results for input(s): GLUCAP in the last 168 hours.  Radiological Exams on Admission: Dg Chest Port 1 View  08/11/2014   CLINICAL DATA:  Initial encounter for shortness of breath. Recent right humerus fracture.  EXAM: PORTABLE CHEST - 1 VIEW  COMPARISON:  08/09/2014  FINDINGS: Claudie Fisherman overlies the apices. Reversed apical lordotic positioning. Moderate cardiomegaly with a tortuous atherosclerotic aorta. No definite pleural fluid. No pneumothorax. Diminished lung volumes. Suspect mild pulmonary venous congestion, without overt congestive failure. Developing bibasilar opacities.  IMPRESSION: Cardiomegaly with suspicion of mild pulmonary venous congestion.  Bibasilar opacities which most likely represent atelectasis (or less likely overlying breast tissue). Consider short term radiographic followup to exclude early pneumonia.   Electronically Signed   By: Jeronimo Greaves M.D.   On: 08/11/2014 16:41      Assessment/Plan   1.Hypoxia. This is probably  multifactorial. She has atelectasis which is most likely the source of her hypoxia. She could have underlying pneumonia. Pulmonary embolism is a possibility but I do not think CT angiogram of the chest and anticoagulation is warranted. She will be treated with intravenous antibiotics, physical therapy and incentive spirometry. 2. Comminuted fracture of the proximal humerus. Stable. Follow with orthopedics as an outpatient. 3. Chronic diastolic congestive heart failure, clinically not decompensated. 4. Moderate protein calorie malnutrition. 5. Chronic atrial fibrillation, not on anticoagulation therapy. She is currently in sinus rhythm. 6. Elevated troponin, mild. I'm not sure the significance of this. We will initiate serial cardiac enzymes.   Code status:DNR    DVT Prophylaxis:Lovenox  Family Communication: Discussed with family at bedside    Disposition Plan: Back to [facility] when stable   Time spent: 60 minutes  [facility] Pager [phone]

## 2014-08-11 NOTE — ED Notes (Signed)
Pt was sent from [facility].  Staff reported pt was admitted there yesterday after falling and having a broken R humerus.  Pt sent here for eval to r/o PE.  Reports pt has had low grade fever and rr 34-36 with shallow respirations.  Pt alert and oriented.  Denies any pain.

## 2014-08-11 NOTE — ED Notes (Signed)
Patients family stated pt c/o of nausea. Went to give IV Zofran, however IV would not push. I attempted times 2 as well as another nurse who also attempted times two, unable to gain another IV access. Dept 300 nurse aware.

## 2014-08-11 NOTE — ED Notes (Signed)
In and out cath performed by myself with Barbara CowerJason, ED tech as chaperone

## 2014-08-11 NOTE — ED Provider Notes (Signed)
CSN: [id number]     Arrival date & time 08/11/14  1437 History   First MD Initiated Contact with Patient 08/11/14 1456     Chief Complaint  Patient presents with  . Shortness of Breath     Patient is a 81 y.o. female presenting with shortness of breath. The history is provided by the nursing home and a relative. The history is limited by the condition of the patient (Hx dementia).  Shortness of Breath Pt was seen at 1500. Per NH report and family: c/o pt with "low O2 Sats" since arriving to [facility] yesterday. Family states pt's O2 Sats yesterday evening were "in the 60's" but pt was not on the 2L N/C O2 as prescribed at hospital discharge. Pt also has not received any pain meds in the past 2 days. Pt was d/c to the NH yesterday afternoon for s/p right humeral fracture and hypoxia, rx tylenol for pain and O2 N/C for hypoxia (presumed to be secondary to pain and atelectasis). Pt's NH RN today felt pt was "breathing shallowly and fast" and had "fevers to 99."  Pt also has had several episodes of N/V since yesterday. Pt has hx of mild dementia and is mildly confused regarding events.    Past Medical History  Diagnosis Date  . Fall     secondary to weakness as of  [place] OV  03/26/10  . H/O orthostatic hypotension   . Chest discomfort     anterior  . Hypertension   . Aortic valve stenosis     mild to moderate  . Syncope and collapse 2006  . Arthritis   . GERD (gastroesophageal reflux disease)   . Osteoporosis   . Abnormal EKG   . Scarlet fever   . Scoliosis   . TIA (transient ischemic attack) 1999  . Fracture, humerus     2016  . A-fib     02/2014, not on anticoagulation   . Diastolic CHF 01/30/2014  . Proximal humerus fracture   . On home O2 08/2014    2L N/C  . Anemia   . Thrombocytopenia   . DNR (do not resuscitate) 08/2014  . Malnutrition   . Frequent falls   . Mild dementia    Past Surgical History  Procedure Laterality Date  . Cholecystectomy    . Cataract extraction Bilateral    . Femur im nail Right 12/17/2013    Procedure: INTRAMEDULLARY (IM) NAIL FEMORAL;  Surgeon: Sheral Apley, MD;  Location: [facility] OR;  Service: Orthopedics;  Laterality: Right;   Family History  Problem Relation Age of Onset  . Pneumonia Mother   . Heart failure Father   . Diabetes Father   . Heart disease Sister    History  Substance Use Topics  . Smoking status: Former Smoker    Quit date: 01/16/1981  . Smokeless tobacco: Never Used  . Alcohol Use: No    Review of Systems  Unable to perform ROS: Dementia  Respiratory: Positive for shortness of breath.       Allergies  Amoxicillin; Doxycycline; Fosamax; Septra; and Tramadol  Home Medications   Prior to Admission medications   Medication Sig Start Date End Date Taking? Authorizing Provider  acetaminophen (TYLENOL) 325 MG tablet Take 2 tablets (650 mg total) by mouth 3 (three) times daily. 08/10/14   Elliot Cousin, MD  ALPRAZolam Prudy Feeler) 0.5 MG tablet Take one tablet by mouth at bedtime to help rest 08/10/14   Elliot Cousin, MD  alum & mag hydroxide-simeth (  MAALOX/MYLANTA) 200-200-20 MG/5ML suspension Take 30 mLs by mouth 2 (two) times daily as needed for indigestion.    Historical Provider, MD  amLODipine (NORVASC) 5 MG tablet Take 5 mg by mouth daily.    Historical Provider, MD  calcium carbonate (OS-CAL) 600 MG TABS Take 1,200 mg by mouth daily with breakfast.     Historical Provider, MD  carvedilol (COREG) 3.125 MG tablet Take 1 tablet (3.125 mg total) by mouth 2 (two) times daily. 02/04/14   Antoine PocheJonathan F Branch, MD  docusate sodium (COLACE) 100 MG capsule Take 1 capsule (100 mg total) by mouth 2 (two) times daily. Continue this while taking narcotics to help with bowel movements 12/17/13   Sheral Apleyimothy D Murphy, MD  esomeprazole (NEXIUM) 40 MG capsule Take 40 mg by mouth daily.     Historical Provider, MD  feeding supplement (GLUCERNA SHAKE) LIQD Take 237 mLs by mouth 3 (three) times daily between meals.  01/19/12   Vassie Lollarlos Madera, MD   ferrous sulfate 325 (65 FE) MG tablet Take 1 tablet (325 mg total) by mouth 2 (two) times daily with a meal. 12/19/13   Calvert CantorSaima Rizwan, MD  halobetasol (ULTRAVATE) 0.05 % cream Apply 1 application topically daily.    Historical Provider, MD  loratadine (CLARITIN) 10 MG tablet Take 10 mg by mouth daily.    Historical Provider, MD  meloxicam (MOBIC) 7.5 MG tablet Take 1 tablet by mouth 2 (two) times daily. 07/12/14   Historical Provider, MD  Multiple Vitamin (MULTIVITAMIN WITH MINERALS) TABS Take 1 tablet by mouth daily.    Historical Provider, MD  promethazine (PHENERGAN) 25 MG tablet Take 0.5 tablets (12.5 mg total) by mouth every 4 (four) hours as needed for nausea or vomiting. 08/10/14   Elliot Cousinenise Fisher, MD  Vitamin D, Ergocalciferol, (DRISDOL) 50000 UNITS CAPS capsule Take 50,000 Units by mouth every 7 (seven) days. Take on Friday.    Historical Provider, MD   BP 182/60 mmHg  Pulse 81  Temp(Src) 97.5 F (36.4 C) (Oral)  Resp 21  SpO2 94% Physical Exam  1505; Physical examination:  Nursing notes reviewed; Vital signs and O2 SAT reviewed;  Constitutional: Thin, frail. Uncomfortable appearing, esp when right shoulder is touched.; Head:  Normocephalic, atraumatic; Eyes: EOMI, PERRL, No scleral icterus; ENMT: Mouth and pharynx normal, Mucous membranes dry; Neck: Supple, Full range of motion, No lymphadenopathy; Cardiovascular: Regular rate and rhythm, No gallop; Respiratory: Breath sounds diminished & equal bilaterally, No wheezes. Tachypneic. Normal respiratory effort/excursion; Chest: Nontender, Movement normal; Abdomen: Soft, Nontender, Nondistended, Normal bowel sounds; Genitourinary: No CVA tenderness; Extremities: Pulses normal, No deformity. +RUE in sling, strong radial pulse, brisk cap refill in fingertips.  No edema, No calf edema or asymmetry.; Neuro: Awake, alert. No facial droop. Major CN grossly intact. Minimal speech. Moves all extremities on stretcher spontaneously.; Skin: Color normal, Warm,  Dry.   ED Course  Procedures      EKG Interpretation   Date/Time:  Sunday August 11 2014 14:44:25 EST Ventricular Rate:  78 PR Interval:  192 QRS Duration: 122 QT Interval:  435 QTC Calculation: 495 R Axis:   -61 Text Interpretation:  Sinus rhythm Atrial premature complex RBBB and LAFB  Left ventricular hypertrophy Confirmed by Malva CoganELOS  MD, Riley LamUGLAS ([id number]) on  08/11/2014 3:04:15 PM      MDM  MDM Reviewed: previous chart, nursing note and vitals Reviewed previous: labs, ECG and x-ray Interpretation: labs, ECG and x-ray Total time providing critical care: 30-74 minutes. This excludes time spent performing separately reportable  procedures and services. Consults: admitting MD   CRITICAL CARE Performed by: Laray Anger Total critical care time: 35 Critical care time was exclusive of separately billable procedures and treating other patients. Critical care was necessary to treat or prevent imminent or life-threatening deterioration. Critical care was time spent personally by me on the following activities: development of treatment plan with patient and/or surrogate as well as nursing, discussions with consultants, evaluation of patient's response to treatment, examination of patient, obtaining history from patient or surrogate, ordering and performing treatments and interventions, ordering and review of laboratory studies, ordering and review of radiographic studies, pulse oximetry and re-evaluation of patient's condition.    Results for orders placed or performed during the hospital encounter of 08/11/14  Troponin I  Result Value Ref Range   Troponin I 0.05 (H) <0.031 ng/mL  Brain natriuretic peptide  Result Value Ref Range   B Natriuretic Peptide 879.0 (H) 0.0 - 100.0 pg/mL  CBC with Differential  Result Value Ref Range   WBC 11.7 (H) 4.0 - 10.5 K/uL   RBC 3.20 (L) 3.87 - 5.11 MIL/uL   Hemoglobin 10.3 (L) 12.0 - 15.0 g/dL   HCT 16.1 (L) 09.6 - 04.5 %   MCV 99.4  78.0 - 100.0 fL   MCH 32.2 26.0 - 34.0 pg   MCHC 32.4 30.0 - 36.0 g/dL   RDW 40.9 81.1 - 91.4 %   Platelets 147 (L) 150 - 400 K/uL   Neutrophils Relative % 91 (H) 43 - 77 %   Neutro Abs 10.6 (H) 1.7 - 7.7 K/uL   Lymphocytes Relative 5 (L) 12 - 46 %   Lymphs Abs 0.6 (L) 0.7 - 4.0 K/uL   Monocytes Relative 4 3 - 12 %   Monocytes Absolute 0.5 0.1 - 1.0 K/uL   Eosinophils Relative 0 0 - 5 %   Eosinophils Absolute 0.0 0.0 - 0.7 K/uL   Basophils Relative 0 0 - 1 %   Basophils Absolute 0.0 0.0 - 0.1 K/uL  Comprehensive metabolic panel  Result Value Ref Range   Sodium 139 135 - 145 mmol/L   Potassium 3.9 3.5 - 5.1 mmol/L   Chloride 97 96 - 112 mmol/L   CO2 37 (H) 19 - 32 mmol/L   Glucose, Bld 168 (H) 70 - 99 mg/dL   BUN 26 (H) 6 - 23 mg/dL   Creatinine, Ser 7.82 0.50 - 1.10 mg/dL   Calcium 9.0 8.4 - 95.6 mg/dL   Total Protein 6.9 6.0 - 8.3 g/dL   Albumin 3.6 3.5 - 5.2 g/dL   AST 18 0 - 37 U/L   ALT 15 0 - 35 U/L   Alkaline Phosphatase 62 39 - 117 U/L   Total Bilirubin 1.2 0.3 - 1.2 mg/dL   GFR calc non Af Amer 71 (L) >90 mL/min   GFR calc Af Amer 82 (L) >90 mL/min   Anion gap 5 5 - 15  Lipase, blood  Result Value Ref Range   Lipase 18 11 - 59 U/L  Lactic acid, plasma  Result Value Ref Range   Lactic Acid, Venous 0.9 0.5 - 2.0 mmol/L  Urinalysis, Routine w reflex microscopic  Result Value Ref Range   Color, Urine YELLOW YELLOW   APPearance CLEAR CLEAR   Specific Gravity, Urine 1.020 1.005 - 1.030   pH 6.0 5.0 - 8.0   Glucose, UA NEGATIVE NEGATIVE mg/dL   Hgb urine dipstick MODERATE (A) NEGATIVE   Bilirubin Urine NEGATIVE NEGATIVE   Ketones, ur  40 (A) NEGATIVE mg/dL   Protein, ur NEGATIVE NEGATIVE mg/dL   Urobilinogen, UA 0.2 0.0 - 1.0 mg/dL   Nitrite NEGATIVE NEGATIVE   Leukocytes, UA NEGATIVE NEGATIVE  Urine microscopic-add on  Result Value Ref Range   RBC / HPF 7-10 <3 RBC/hpf   Bacteria, UA FEW (A) RARE   Dg Chest Port 1 View 08/11/2014   CLINICAL DATA:  Initial  encounter for shortness of breath. Recent right humerus fracture.  EXAM: PORTABLE CHEST - 1 VIEW  COMPARISON:  08/09/2014  FINDINGS: Claudie Fisherman overlies the apices. Reversed apical lordotic positioning. Moderate cardiomegaly with a tortuous atherosclerotic aorta. No definite pleural fluid. No pneumothorax. Diminished lung volumes. Suspect mild pulmonary venous congestion, without overt congestive failure. Developing bibasilar opacities.  IMPRESSION: Cardiomegaly with suspicion of mild pulmonary venous congestion.  Bibasilar opacities which most likely represent atelectasis (or less likely overlying breast tissue). Consider short term radiographic followup to exclude early pneumonia.   Electronically Signed   By: Jeronimo Greaves M.D.   On: 08/11/2014 16:41    1705:  Pt appears more comfortable after pain meds. Troponin elevated, but EKG without acute ST elevations and pt denies CP; will hold ASA due to thrombocytopenia. BNP elevated with pulmonary vascular congestion on CXR; will dose IV lasix. Bibasilar opacities on CXR with elevated WBC count and hypoxia; will tx with IV abx for HCAP. Family states the NH "told us she needed a CT scan to make sure she didn't have a blood clot in her lungs." Long d/w family regarding this, as pt is not an anticoagulation candidate. Multiple questions answered; family verb understanding and are agreeable to defer CT-A chest at this time. Dx and testing d/w pt and family.  Questions answered.  Verb understanding, agreeable to admit.   T/C to [facility]. Karilyn Cota, case discussed, including:  HPI, pertinent PM/SHx, VS/PE, dx testing, ED course and treatment:  Agreeable to admit, requests to write temporary orders, obtain observation tele bed to team APAdmits.   Samuel Jester, DO 08/14/14 2007

## 2014-08-11 NOTE — ED Notes (Signed)
Report called to floor nurse

## 2014-08-12 ENCOUNTER — Inpatient Hospital Stay (HOSPITAL_COMMUNITY): Payer: Medicare Other

## 2014-08-12 DIAGNOSIS — R112 Nausea with vomiting, unspecified: Secondary | ICD-10-CM | POA: Diagnosis not present

## 2014-08-12 DIAGNOSIS — R778 Other specified abnormalities of plasma proteins: Secondary | ICD-10-CM | POA: Diagnosis present

## 2014-08-12 DIAGNOSIS — S42301D Unspecified fracture of shaft of humerus, right arm, subsequent encounter for fracture with routine healing: Secondary | ICD-10-CM

## 2014-08-12 DIAGNOSIS — J189 Pneumonia, unspecified organism: Secondary | ICD-10-CM | POA: Insufficient documentation

## 2014-08-12 DIAGNOSIS — S42301A Unspecified fracture of shaft of humerus, right arm, initial encounter for closed fracture: Secondary | ICD-10-CM | POA: Insufficient documentation

## 2014-08-12 DIAGNOSIS — R0902 Hypoxemia: Secondary | ICD-10-CM

## 2014-08-12 DIAGNOSIS — R7989 Other specified abnormal findings of blood chemistry: Secondary | ICD-10-CM | POA: Diagnosis present

## 2014-08-12 LAB — CBC
HCT: 33.4 % — ABNORMAL LOW (ref 36.0–46.0)
HEMOGLOBIN: 10.9 g/dL — AB (ref 12.0–15.0)
MCH: 31.5 pg (ref 26.0–34.0)
MCHC: 32.6 g/dL (ref 30.0–36.0)
MCV: 96.5 fL (ref 78.0–100.0)
Platelets: 208 10*3/uL (ref 150–400)
RBC: 3.46 MIL/uL — ABNORMAL LOW (ref 3.87–5.11)
RDW: 13.8 % (ref 11.5–15.5)
WBC: 18.7 10*3/uL — AB (ref 4.0–10.5)

## 2014-08-12 LAB — COMPREHENSIVE METABOLIC PANEL
ALBUMIN: 3.5 g/dL (ref 3.5–5.2)
ALK PHOS: 61 U/L (ref 39–117)
ALT: 16 U/L (ref 0–35)
AST: 24 U/L (ref 0–37)
Anion gap: 9 (ref 5–15)
BUN: 23 mg/dL (ref 6–23)
CHLORIDE: 94 mmol/L — AB (ref 96–112)
CO2: 35 mmol/L — AB (ref 19–32)
CREATININE: 0.61 mg/dL (ref 0.50–1.10)
Calcium: 8.9 mg/dL (ref 8.4–10.5)
GFR calc non Af Amer: 72 mL/min — ABNORMAL LOW (ref >90)
GFR, EST AFRICAN AMERICAN: 84 mL/min — AB (ref >90)
Glucose, Bld: 151 mg/dL — ABNORMAL HIGH (ref 70–99)
POTASSIUM: 3.1 mmol/L — AB (ref 3.5–5.1)
Sodium: 138 mmol/L (ref 135–145)
Total Bilirubin: 1.8 mg/dL — ABNORMAL HIGH (ref 0.3–1.2)
Total Protein: 6.9 g/dL (ref 6.0–8.3)

## 2014-08-12 LAB — TROPONIN I
Troponin I: 0.06 ng/mL — ABNORMAL HIGH (ref <0.031)
Troponin I: 0.05 ng/mL — ABNORMAL HIGH (ref <0.031)

## 2014-08-12 LAB — MRSA PCR SCREENING: MRSA BY PCR: POSITIVE — AB

## 2014-08-12 MED ORDER — POTASSIUM CHLORIDE CRYS ER 20 MEQ PO TBCR
40.0000 meq | EXTENDED_RELEASE_TABLET | Freq: Once | ORAL | Status: AC
  Administered 2014-08-12: 40 meq via ORAL
  Filled 2014-08-12: qty 2

## 2014-08-12 MED ORDER — CHLORHEXIDINE GLUCONATE CLOTH 2 % EX PADS
6.0000 | MEDICATED_PAD | Freq: Every day | CUTANEOUS | Status: DC
  Administered 2014-08-12 – 2014-08-14 (×3): 6 via TOPICAL

## 2014-08-12 MED ORDER — ONDANSETRON HCL 4 MG/2ML IJ SOLN
4.0000 mg | INTRAMUSCULAR | Status: DC | PRN
Start: 2014-08-12 — End: 2014-08-14

## 2014-08-12 MED ORDER — IOHEXOL 350 MG/ML SOLN
100.0000 mL | Freq: Once | INTRAVENOUS | Status: AC | PRN
  Administered 2014-08-12: 100 mL via INTRAVENOUS

## 2014-08-12 MED ORDER — MUPIROCIN 2 % EX OINT
1.0000 "application " | TOPICAL_OINTMENT | Freq: Two times a day (BID) | CUTANEOUS | Status: DC
  Administered 2014-08-12 – 2014-08-14 (×5): 1 via NASAL
  Filled 2014-08-12 (×2): qty 22

## 2014-08-12 MED ORDER — ONDANSETRON HCL 4 MG PO TABS: 4.0000 mg | ORAL_TABLET | ORAL | Status: DC | PRN

## 2014-08-12 MED ORDER — PROMETHAZINE HCL 12.5 MG PO TABS
6.2500 mg | ORAL_TABLET | Freq: Four times a day (QID) | ORAL | Status: DC | PRN
  Administered 2014-08-12: 6.25 mg via ORAL
  Filled 2014-08-12: qty 1

## 2014-08-12 NOTE — Progress Notes (Signed)
[facility] PROGRESS NOTE  CATALEA LABRECQUE YNW:[id number] DOB: Jun 08, 1913 DOA: 08/11/2014 PCP: Pamelia Hoit, MD  Assessment/Plan: 1.Hypoxia. Likkely multifactorial i.e. Atelectasis and possible early pna. WBC 18. She is afebrile and non-toxic appearing.  Also concern for PE. Will obtain CT chest to rule out. Continue with Levaquin day #2. Encourage IS. Mobilize as able.  2. Nausea with vomiting. Chart review indicates history of same with narcotics. Will discontinue narcotics. Continue zofran and very low dose phenergan if breakthrough 2. Comminuted fracture of the proximal humerus. Stable. Sling in place. Follow with orthopedics as an outpatient. 3. Chronic diastolic congestive heart failure, clinically not decompensated. Given 1 dose lasix yesterday.  Monitor intake and output. Daily weights.  4. Moderate protein calorie malnutrition. BMI 19.4 Nutritional supplement once appetite reliable 5. Chronic atrial fibrillation, not on anticoagulation therapy. She remains in sinus rhythm. 6. Elevated troponin, mild and stable x 3. Denies chest pain.  7. Thrombocytopenia resolved 8. Frequent falls: recently placed snf. Patient also takes phenergan and xanax which could be contributing   Code Status: DNR Family Communication: daughter at bedside Disposition Plan: facility Consultants:  none  Procedures:  none  Antibiotics:  levaquin 08/11/14>>  HPI/Subjective: Up in chair. Denies pain but reports "i feel like i am falling out of chair".   Objective: Filed Vitals:   08/12/14 0529  BP: 135/87  Pulse: 122  Temp: 98.6 F (37 C)  Resp: 22   No intake or output data in the 24 hours ending 08/12/14 1225 Filed Weights   08/11/14 1912  Weight: 44.906 kg (99 lb)    Exam:   General:  Thin frail appears calm  Cardiovascular: irregularly irregular no MGR   Respiratory: non-labored shallow BS distant poor cough effort  Abdomen: soft +BS non-tender to  palpation  Musculoskeletal: right shoulder with sling intact. Fingers to right hand warm to touch.    Data Reviewed: Basic Metabolic Panel:  Recent Labs Lab 08/09/14 0938 08/10/14 0637 08/11/14 1530 08/12/14 0421  NA 139 142 139 138  K 3.8 4.7 3.9 3.1*  CL 101 105 97 94*  CO2 36* 32 37* 35*  GLUCOSE 133* 130* 168* 151*  BUN 16 25* 26* 23  CREATININE 0.56 0.89 0.65 0.61  CALCIUM 8.7 8.7 9.0 8.9   Liver Function Tests:  Recent Labs Lab 08/11/14 1530 08/12/14 0421  AST 18 24  ALT 15 16  ALKPHOS 62 61  BILITOT 1.2 1.8*  PROT 6.9 6.9  ALBUMIN 3.6 3.5    Recent Labs Lab 08/11/14 1530  LIPASE 18   No results for input(s): AMMONIA in the last 168 hours. CBC:  Recent Labs Lab 08/09/14 0938 08/10/14 0637 08/11/14 1530 08/12/14 0421  WBC 12.6* 9.3 11.7* 18.7*  NEUTROABS 11.2*  --  10.6*  --   HGB 11.0* 9.1* 10.3* 10.9*  HCT 34.1* 28.7* 31.8* 33.4*  MCV 98.0 101.1* 99.4 96.5  PLT 162 139* 147* 208   Cardiac Enzymes:  Recent Labs Lab 08/11/14 1530 08/11/14 1755 08/12/14 0042 08/12/14 0421  TROPONINI 0.05* 0.05* 0.06* 0.05*   BNP (last 3 results)  Recent Labs  08/11/14 1530  BNP 879.0*    ProBNP (last 3 results)  Recent Labs  01/02/14 1212  PROBNP 456.0*    CBG: No results for input(s): GLUCAP in the last 168 hours.  Recent Results (from the past 240 hour(s))  MRSA PCR Screening     Status: Abnormal   Collection Time: 08/11/14  2:34 AM  Result Value Ref Range  Status   MRSA by PCR POSITIVE (A) NEGATIVE Final    Comment:        The GeneXpert MRSA Assay (FDA approved for NASAL specimens only), is one component of a comprehensive MRSA colonization surveillance program. It is not intended to diagnose MRSA infection nor to guide or monitor treatment for MRSA infections. RESULT CALLED TO, READ BACK BY AND VERIFIED WITH:  MARTIN,S @ 0423 ON 08/12/14 BY Anner CreteWOODIE,J      Studies: Dg Chest Port 1 View  08/11/2014   CLINICAL DATA:  Initial  encounter for shortness of breath. Recent right humerus fracture.  EXAM: PORTABLE CHEST - 1 VIEW  COMPARISON:  08/09/2014  FINDINGS: Claudie FishermanChin overlies the apices. Reversed apical lordotic positioning. Moderate cardiomegaly with a tortuous atherosclerotic aorta. No definite pleural fluid. No pneumothorax. Diminished lung volumes. Suspect mild pulmonary venous congestion, without overt congestive failure. Developing bibasilar opacities.  IMPRESSION: Cardiomegaly with suspicion of mild pulmonary venous congestion.  Bibasilar opacities which most likely represent atelectasis (or less likely overlying breast tissue). Consider short term radiographic followup to exclude early pneumonia.   Electronically Signed   By: Jeronimo GreavesKyle  Talbot M.D.   On: 08/11/2014 16:41    Scheduled Meds: . sodium chloride   Intravenous STAT  . acetaminophen  650 mg Oral TID  . ALPRAZolam  0.5 mg Oral QHS  . amLODipine  5 mg Oral Daily  . calcium carbonate  1,250 mg Oral Q breakfast  . carvedilol  3.125 mg Oral BID  . Chlorhexidine Gluconate Cloth  6 each Topical Q0600  . docusate sodium  100 mg Oral BID  . enoxaparin (LOVENOX) injection  30 mg Subcutaneous Q24H  . feeding supplement (GLUCERNA SHAKE)  237 mL Oral TID BM  . ferrous sulfate  325 mg Oral BID WC  . levofloxacin (LEVAQUIN) IV  750 mg Intravenous Q48H  . loratadine  10 mg Oral Daily  . multivitamin with minerals  1 tablet Oral Daily  . mupirocin ointment  1 application Nasal BID  . pantoprazole  40 mg Oral Daily  . potassium chloride  40 mEq Oral Once  . sodium chloride  3 mL Intravenous Q12H  . [START ON 08/16/2014] Vitamin D (Ergocalciferol)  50,000 Units Oral Q7 days   Continuous Infusions:   Active Problems:   HTN (hypertension)   Protein-calorie malnutrition, severe   Humerus fracture   Hypoxia   Thrombocytopenia   Elevated troponin   Nausea with vomiting    Time spent: 35 minutes    [facility] ClinicBLACK,Yoona Ishii M  [facility] Pager [phone]. If 7PM-7AM,  please contact night-coverage at [email or web address], password [email or web address] 08/12/2014, 12:25 PM  LOS: 1 day

## 2014-08-12 NOTE — Clinical Social Work Placement (Signed)
Clinical Social Work Department CLINICAL SOCIAL WORK PLACEMENT NOTE 08/12/2014  Patient:  Briana Schultz,Briana Schultz  Account Number:  [id number] Admit date:  08/11/2014  Clinical Social Worker:  Robin SearingJANET Hinley Brimage, LCSWA  Date/time:  08/12/2014 02:00 PM  Clinical Social Work is seeking post-discharge placement for this patient at the following level of care:   SKILLED NURSING   (*CSW will update this form in Epic as items are completed)   08/12/2014  Patient/family provided with [facility] of [facility] Work's list of facilities offering this level of care within the geographic area requested by the patient (or if unable, by the patient's family).  08/12/2014  Patient/family informed of their freedom to choose among providers that offer the needed level of care, that participate in Medicare, Medicaid or managed care program needed by the patient, have an available bed and are willing to accept the patient.  08/12/2014  Patient/family informed of [facility]' ownership interest in [facility], as well as of the fact that they are under no obligation to receive care at this facility.  PASARR submitted to EDS on 08/12/2014 PASARR number received on 08/12/2014  FL2 transmitted to all facilities in geographic area requested by pt/family on  08/12/2014 FL2 transmitted to all facilities within larger geographic area on   Patient informed that his/her managed care company has contracts with or will negotiate with  certain facilities, including the following:     Patient/family informed of bed offers received:   Patient chooses bed at  Physician recommends and patient chooses bed at    Patient to be transferred to  on   Patient to be transferred to facility by  Patient and family notified of transfer on  Name of family member notified:    The following physician request were entered in Epic:   Additional Comments: Briana Schultz, Briana Schultz, Briana Schultz [phone]

## 2014-08-12 NOTE — Evaluation (Signed)
Physical Therapy Evaluation Patient Details Name: Briana Schultz MRN: [id number] DOB: 08-15-1912 Today's Date: 08/12/2014   History of Present Illness  This is a 81 year old lady who was discharged from the hospital 08/10/2014. She had been admitted with hypoxia which was felt to be due to atelectasis and also had a humerus fracture. She was discharged to skilled nursing facility 08/10/2014. She returns now because of worsening hypoxia and shallow respirations. The patient denies cough or fever. There is no nausea, vomiting, abdominal pain, chest pain, palpitations. Evaluation in the emergency room shows a chest x-ray which is consistent with either pulmonary edema or or atelectasis. There is a suggestion of a possibility of infective source. She is now being admitted for further management.    Clinical Impression  Briana Schultz, a 81 year old pleasant lady, was seen in bed, awake, alert, oriented, cooperative and able to follow directions well. She was discharged to skilled nursing facility yesterday. She returns now because of worsening hypoxia and shallow respirations. Daughter reports patient has not eaten breakfast and complaints of  Nausea. Nursing staff was notified. Current functional status: Bed mobility max assist with 2 person, Transfers max assist, Gait not assess as this time due to weakness and acute pain on Right shoulder. Patient is currently much weaker today since she was last seen by PT prior to D/C 08/10/2014.       Follow Up Recommendations SNF    Equipment Recommendations  None recommended by PT    Recommendations for Other Services OT consult     Precautions / Restrictions Precautions Precautions: Fall;Shoulder Shoulder Interventions: Shoulder sling/immobilizer Restrictions Weight Bearing Restrictions:  (for BLE ) RUE Weight Bearing: Non weight bearing      Mobility  Bed Mobility Overal bed mobility: Needs Assistance;+2 for physical assistance Bed Mobility:  Supine to Sit     Supine to sit: Max assist;+2 for physical assistance;HOB elevated        Transfers Overall transfer level: Needs assistance Equipment used: None Transfers: Stand Pivot Transfers   Stand pivot transfers: Max assist;+2 physical assistance       General transfer comment: Patient unable to weight bear on BLE in standing due to generalized weakness                  Stairs            Wheelchair Mobility    Modified Rankin (Stroke Patients Only)       Balance Overall balance assessment: Needs assistance Sitting-balance support: Single extremity supported;Feet supported Sitting balance-Leahy Scale: Poor   Postural control: Posterior lean;Left lateral lean                                   Pertinent Vitals/Pain Pain Assessment: 0-10 Pain Score: 10-Worst pain ever (Patient report pain in the R shoulder with movement and no pain iduring quiet resting position) Pain Location: Right shoulder Pain Descriptors / Indicators: Aching Pain Intervention(s): Limited activity within patient's tolerance;Premedicated before session    Home Living Family/patient expects to be discharged to:: Skilled nursing facility                      Prior Function Level of Independence: Independent with assistive device(s)         Comments: PTA pt ambulating with RW     Hand Dominance   Dominant Hand: Right    Extremity/Trunk Assessment   Upper  Extremity Assessment: Defer to OT evaluation RUE Deficits / Details: Right UE in sling due to recent fracture         Lower Extremity Assessment: Generalized weakness      Cervical / Trunk Assessment: Kyphotic  Communication   Communication: No difficulties  Cognition Arousal/Alertness: Awake/alert Behavior During Therapy: WFL for tasks assessed/performed Overall Cognitive Status: Within Functional Limits for tasks assessed                      General Comments       Exercises Total Joint Exercises Ankle Circles/Pumps: AROM;20 reps;Supine;Both Quad Sets: AROM;Both;20 reps;Supine Gluteal Sets: AROM;Both;20 reps;Supine Heel Slides: AROM;Both;10 reps;Supine      Assessment/Plan    PT Assessment Patient needs continued PT services  PT Diagnosis Difficulty walking;Generalized weakness;Acute pain   PT Problem List Decreased strength;Decreased activity tolerance;Decreased balance;Decreased mobility;Pain;Cardiopulmonary status limiting activity  PT Treatment Interventions Functional mobility training;Therapeutic activities;Therapeutic exercise;Balance training;Patient/family education   PT Goals (Current goals can be found in the Care Plan section) Acute Rehab PT Goals Patient Stated Goal: to walk again PT Goal Formulation: With patient/family Time For Goal Achievement: 08/19/14 Potential to Achieve Goals: Good    Frequency Min 3X/week   Barriers to discharge   none    Co-evaluation               End of Session Equipment Utilized During Treatment: Gait belt Activity Tolerance: Patient limited by fatigue;Patient limited by pain Patient left: with family/visitor present;with call bell/phone within reach;in chair;with chair alarm set Nurse Communication: Mobility status         Time: [id number] PT Time Calculation (min) (ACUTE ONLY): 40 min   Charges:   PT Evaluation $Initial PT Evaluation Tier I: 1 Procedure PT Treatments $Therapeutic Exercise: 8-22 mins   PT G CodesKonrad Schultz:        Briana Schultz 08/12/2014, 11:34 AM

## 2014-08-12 NOTE — Progress Notes (Signed)
Late Entry:  Toya SmothersKaren Black, NP  was notified of the patient's daughter complaint and the patient actually vomiting.  New orders given and followed.  The patient has also made to more skin tears on bilateral legs.  All tears are covered with foam dressing.  I explained to the family that due to the patient have tissue paper like thin skin she possibly and most likely is tearing them on the bed rails and the recliner chair.  To protect her legs I wrapped them bilaterally with ace bandage and told the family the wrap could be changed prn or taken of when she got home in her own things.  They verbalized understanding.

## 2014-08-12 NOTE — Care Management Utilization Note (Signed)
UR completed 

## 2014-08-12 NOTE — Clinical Social Work Psychosocial (Signed)
Clinical Social Work Department BRIEF PSYCHOSOCIAL ASSESSMENT 08/12/2014  Patient:  Briana Schultz, Briana Schultz     Account Number:  [id number]     Admit date:  08/11/2014  Clinical Social Worker:  Daiva Huge  Date/Time:  08/12/2014 01:40 PM  Referred by:  Physician  Date Referred:  08/12/2014 Referred for  SNF Placement   Other Referral:   Interview type:  Other - See comment Other interview type:    PSYCHOSOCIAL DATA Living Status:  FACILITY Admitted from facility:  [facility] Level of care:  [place] Primary support name:  Vanice Sarah [phone] Primary support relationship to patient:  FAMILY Degree of support available:   GOOD    CURRENT CONCERNS Current Concerns  Post-Acute Placement   Other Concerns:    SOCIAL WORK ASSESSMENT / PLAN CSW met with daughter who shares that patient was d/c'ed to [facility] and only stayed a "few hours". She is concerned that patient did not get "oxygen or any medications during the time she was at the facility'.  Per daughter, "she came back with her oxygen in the 82's". Daughter would like to try to get her into another SNF at d/c if available-  "I prefer [facility]". CSW will fax FL2 and clinicals to surrounding SNF's for possible offers-   Assessment/plan status:  Other - See comment Other assessment/ plan:   will update FL2 and fax out to area SNF's per daughter request   Information/referral to community resources:    PATIENT'S/FAMILY'S RESPONSE TO PLAN OF CARE: Daughter voiced extreme concern about the lack of oxygen and meds upon her arrival at SNF. CSW ha sencourged her to share with the SNF her concerns. As well, she is asking to look at other options at d/c so CSW is pursuing offers and will advise.       Eduard Clos, MSW, [place]

## 2014-08-13 DIAGNOSIS — R0600 Dyspnea, unspecified: Secondary | ICD-10-CM | POA: Insufficient documentation

## 2014-08-13 DIAGNOSIS — E873 Alkalosis: Secondary | ICD-10-CM

## 2014-08-13 LAB — URINE CULTURE
Colony Count: NO GROWTH
Culture: NO GROWTH

## 2014-08-13 LAB — BASIC METABOLIC PANEL
BUN: 33 mg/dL — ABNORMAL HIGH (ref 6–23)
CHLORIDE: 97 mmol/L (ref 96–112)
CO2: 40 mmol/L — AB (ref 19–32)
Calcium: 9 mg/dL (ref 8.4–10.5)
Creatinine, Ser: 0.68 mg/dL (ref 0.50–1.10)
GFR calc non Af Amer: 70 mL/min — ABNORMAL LOW (ref >90)
GFR, EST AFRICAN AMERICAN: 81 mL/min — AB (ref >90)
Glucose, Bld: 120 mg/dL — ABNORMAL HIGH (ref 70–99)
Potassium: 3.7 mmol/L (ref 3.5–5.1)
Sodium: 139 mmol/L (ref 135–145)

## 2014-08-13 LAB — CBC
HEMATOCRIT: 27.9 % — AB (ref 36.0–46.0)
HEMOGLOBIN: 9.3 g/dL — AB (ref 12.0–15.0)
MCH: 33 pg (ref 26.0–34.0)
MCHC: 33.3 g/dL (ref 30.0–36.0)
MCV: 98.9 fL (ref 78.0–100.0)
Platelets: 146 10*3/uL — ABNORMAL LOW (ref 150–400)
RBC: 2.82 MIL/uL — ABNORMAL LOW (ref 3.87–5.11)
RDW: 13.8 % (ref 11.5–15.5)
WBC: 7.5 10*3/uL (ref 4.0–10.5)

## 2014-08-13 LAB — GLUCOSE, CAPILLARY: GLUCOSE-CAPILLARY: 205 mg/dL — AB (ref 70–99)

## 2014-08-13 MED ORDER — SODIUM CHLORIDE 0.9 % IV SOLN
INTRAVENOUS | Status: AC
  Administered 2014-08-13: 11:00:00 via INTRAVENOUS

## 2014-08-13 NOTE — Care Management Note (Addendum)
    Page 1 of 1   08/14/2014     11:21:27 AM CARE MANAGEMENT NOTE 08/14/2014  Patient:  Briana Schultz,Briana Schultz   Account Number:  [id number]  Date Initiated:  08/13/2014  Documentation initiated by:  Kathyrn SheriffHILDRESS,JESSICA  Subjective/Objective Assessment:   [facility] and was removed by family and brought back to hospital. [facility]. CSW aware of discharge plan and is arranging for placement. No CM needs identified at this time.     Action/Plan:   Anticipated DC Date:  08/15/2014   Anticipated DC Plan:  SKILLED NURSING FACILITY  In-house referral  Clinical Social Worker      DC Planning Services  CM consult      Choice offered to / List presented to:             Status of service:  Completed, signed off Medicare Important Message given?  YES (If response is "NO", the following Medicare IM given date fields will be blank) Date Medicare IM given:  08/14/2014 Medicare IM given by:  Kathyrn SheriffHILDRESS,JESSICA Date Additional Medicare IM given:   Additional Medicare IM given by:    Discharge Disposition:  SKILLED NURSING FACILITY  Per UR Regulation:  Reviewed for med. necessity/level of care/duration of stay  If discussed at Long Length of Stay Meetings, dates discussed:    Comments:  08/13/2014 1300 Kathyrn SheriffJessica Childress, RN, MSN, CM

## 2014-08-13 NOTE — Progress Notes (Signed)
Physical Therapy Treatment Patient Details Name: Briana Schultz MRN: [id number] DOB: 11/28/1912 Today's Date: 08/13/2014    History of Present Illness This is a 81 year old lady who was discharged from the hospital 08/10/2014. She had been admitted with hypoxia which was felt to be due to atelectasis and also had a humerus fracture. She was discharged to skilled nursing facility 08/10/2014. She returns now because of worsening hypoxia and shallow respirations. The patient denies cough or fever. There is no nausea, vomiting, abdominal pain, chest pain, palpitations. Evaluation in the emergency room shows a chest x-ray which is consistent with either pulmonary edema or or atelectasis. There is a suggestion of a possibility of infective source. She is now being admitted for further management.    PT Comments    Patient was seen today in bed with daughter in the room, initially slightly sleepy but easily awaken by touch and voice. She remains awake, alert, cooperative and able to follow directions well. Tolerated all therapeutic exercises with no complaints of pain or signs of intolerance. Current function status: Bed mobility Moderate assist with 2 person, Transfers Moderate assist with 2 person. Patient has increase and improve motor control of LE during Therapeutic exercises and patient is helping herself to get from sitting to standing by grabbing on bed rails and extending knee. Patient was left in the chair with chair alarm, call bed and daughter in the room in seated upright position ready to eat lunch.      Equipment Recommendations  None    Recommendations for Other Services None     Precautions / Restrictions Precautions Precautions: Fall;Shoulder Shoulder Interventions: Shoulder sling/immobilizer Restrictions Weight Bearing Restrictions: No RUE Weight Bearing: Non weight bearing    Mobility  Bed Mobility Overal bed mobility: Needs Assistance;+2 for physical assistance Bed Mobility:  Supine to Sit  Supine to sit: +2 for physical assistance;HOB elevated;Mod assist     Transfers Overall transfer level: Needs assistance Equipment used: None Transfers: Stand Pivot Transfers   Stand pivot transfers: Mod assist;+2 physical assistance    General transfer comment: Patient is helping self to get up from sitting position by grabing on bed rails and noticeable knee extension and side stepping towards the chair    Balance Overall balance assessment: Needs assistance Sitting-balance support: Feet supported;Single extremity supported Sitting balance-Leahy Scale: Poor   Postural control: Posterior lean      Cognition Arousal/Alertness: Awake/alert Behavior During Therapy: WFL for tasks assessed/performed Overall Cognitive Status: Within Functional Limits for tasks assessed     Exercises Total Joint Exercises Ankle Circles/Pumps: AROM;20 reps;Supine;Both Quad Sets: AROM;Both;20 reps;Supine Gluteal Sets: AROM;Both;20 reps;Supine Heel Slides: AROM;Both;Supine;20 reps Hip ABduction/ADduction: AROM;Both;20 reps;Supine        Pertinent Vitals/Pain Pain Assessment: No/denies pain           PT Goals (current goals can now be found in the care plan section) Progress towards PT goals: Progressing toward goals       PT Plan Current plan remains appropriate       End of Session Equipment Utilized During Treatment: Gait belt Activity Tolerance: Patient limited by fatigue Patient left: with family/visitor present;with call bell/phone within reach;in chair;with chair alarm set     Time: 1130-1205 PT Time Calculation (min) (ACUTE ONLY): 35 min  Charges:  $Therapeutic Exercise: 8-22 mins $Therapeutic Activity: 8-22 mins                        Joud Ingwersen A 08/13/2014, 12:33 PM

## 2014-08-13 NOTE — Progress Notes (Signed)
[facility] PROGRESS NOTE  Briana Schultz JYN:[id number] DOB: 05/02/1913 DOA: 08/11/2014 PCP: Pamelia HoitWILSON,FRED HENRY, MD  Assessment/Plan: 1.Hypoxia. At baseline.  Likely multifactorial i.e. Atelectasis and possible early pna. WBC 18 on admission is now normalized. She remains afebrile and non-toxic appearing.CT chest negative for PE. Of note, patient recently discharged from hospital on oxygen. It appears some confusion at facility with regards to need for oxygen and/or the staff's knowledge of her needing oxygen. Continue with Levaquin day #3. Encourage IS. Mobilize as able.  2. Nausea with vomiting. Chart review indicates history of same with narcotics. No vomiting this am. Tolerated 50% breakfast without problem.  Continue zofran and very low dose phenergan if breakthrough 2. Comminuted fracture of the proximal humerus. Stable. Sling in place. Follow with orthopedics as an outpatient in 3 weeks. . 3. Chronic diastolic congestive heart failure, clinically not decompensated. No intake and output data documented.    4. Moderate protein calorie malnutrition. BMI 19.4 Nutritional supplement once appetite reliable 5. Chronic atrial fibrillation, not on anticoagulation therapy. She remains in sinus rhythm. Tele discontinued 6. Elevated troponin, mild and stable x 3. Denies chest pain.  7. Thrombocytopenia. Mild. Hx of intermittent. No s/sx bleeding. monitor 8. Frequent falls: recently placed snf. Patient also takes phenergan and xanax which could be contributing 9. Metabolic alkalosis: likely related to decreased po intake and shallow respirations in setting of atelectasis resulting in chronic hypoxia. Will provide gentle IV fluids. Recheck in am.    Code Status: DNR Family Communication: daughter at bedside Disposition Plan: facility hopefully tomorrow   Consultants:  none  Procedures:  none  Antibiotics:  Levaquin 08/11/14>>  HPI/Subjective: Lying in bed eyes closed. Aroused to  verbal stimuli. Denies pain/discomfort  Objective: Filed Vitals:   08/13/14 0515  BP: 100/71  Pulse: 60  Temp: 97.6 F (36.4 C)  Resp: 20   No intake or output data in the 24 hours ending 08/13/14 1044 Filed Weights   08/11/14 1912 08/13/14 0515  Weight: 44.906 kg (99 lb) 37.694 kg (83 lb 1.6 oz)    Exam:   General:  Thins frail somewhat pale  Cardiovascular: irregularly irregular no m/g/r  No LE edema  Respiratory: normal effort somewhat shallow with distant BS no wheeze very poor cough effort  Abdomen: flat soft +BS non-tender to palpation  Musculoskeletal: right shoulder in sling. Fingers to right hand warm. Other joints without swelling/erythema   Data Reviewed: Basic Metabolic Panel:  Recent Labs Lab 08/09/14 0938 08/10/14 0637 08/11/14 1530 08/12/14 0421 08/13/14 0730  NA 139 142 139 138 139  K 3.8 4.7 3.9 3.1* 3.7  CL 101 105 97 94* 97  CO2 36* 32 37* 35* 40*  GLUCOSE 133* 130* 168* 151* 120*  BUN 16 25* 26* 23 33*  CREATININE 0.56 0.89 0.65 0.61 0.68  CALCIUM 8.7 8.7 9.0 8.9 9.0   Liver Function Tests:  Recent Labs Lab 08/11/14 1530 08/12/14 0421  AST 18 24  ALT 15 16  ALKPHOS 62 61  BILITOT 1.2 1.8*  PROT 6.9 6.9  ALBUMIN 3.6 3.5    Recent Labs Lab 08/11/14 1530  LIPASE 18   No results for input(s): AMMONIA in the last 168 hours. CBC:  Recent Labs Lab 08/09/14 0938 08/10/14 0637 08/11/14 1530 08/12/14 0421 08/13/14 0730  WBC 12.6* 9.3 11.7* 18.7* 7.5  NEUTROABS 11.2*  --  10.6*  --   --   HGB 11.0* 9.1* 10.3* 10.9* 9.3*  HCT 34.1* 28.7* 31.8* 33.4* 27.9*  MCV  98.0 101.1* 99.4 96.5 98.9  PLT 162 139* 147* 208 146*   Cardiac Enzymes:  Recent Labs Lab 08/11/14 1530 08/11/14 1755 08/12/14 0042 08/12/14 0421  TROPONINI 0.05* 0.05* 0.06* 0.05*   BNP (last 3 results)  Recent Labs  08/11/14 1530  BNP 879.0*    ProBNP (last 3 results)  Recent Labs  01/02/14 1212  PROBNP 456.0*    CBG: No results for  input(s): GLUCAP in the last 168 hours.  Recent Results (from the past 240 hour(s))  MRSA PCR Screening     Status: Abnormal   Collection Time: 08/11/14  2:34 AM  Result Value Ref Range Status   MRSA by PCR POSITIVE (A) NEGATIVE Final    Comment:        The GeneXpert MRSA Assay (FDA approved for NASAL specimens only), is one component of a comprehensive MRSA colonization surveillance program. It is not intended to diagnose MRSA infection nor to guide or monitor treatment for MRSA infections. RESULT CALLED TO, READ BACK BY AND VERIFIED WITH:  MARTIN,S @ 0423 ON 08/12/14 BY Anner Crete      Studies: Ct Angio Chest Pe W/cm &/or Wo Cm  08/12/2014   CLINICAL DATA:  Acute hypoxia, shortness of breath  EXAM: CT ANGIOGRAPHY CHEST WITH CONTRAST  TECHNIQUE: Multidetector CT imaging of the chest was performed using the standard protocol during bolus administration of intravenous contrast. Multiplanar CT image reconstructions and MIPs were obtained to evaluate the vascular anatomy.  CONTRAST:  OMNIPAQUE IOHEXOL 350 MG/ML SOLN  COMPARISON:  01/02/2014  FINDINGS: Pulmonary arteries are well visualized. No significant filling defect or pulmonary embolus demonstrated by CTA. Atherosclerosis noted of the ectatic tortuous thoracic aorta. Mild cardiomegaly evident. No pericardial effusion. Small hiatal hernia noted. Negative for adenopathy. small pleural effusions noted, slightly larger on the right.  The lung windows demonstrate chronic apical pleural parenchymal scarring. Mild hyperinflation. Minor dependent bibasilar subsegmental compressive atelectasis. No focal pneumonia, consolidation, or definite pneumonia. Negative for pneumothorax.  Included upper abdomen demonstrates scattered hypodense renal cysts. Contrast refluxes into IVC and hepatic veins, suggesting right heart failure.  Diffuse degenerative changes of thoracic spine with increased thoracic kyphosis. Motion artifact noted across sternum.  Review  of the MIP images confirms the above findings.  IMPRESSION: Negative for significant acute pulmonary embolus by CTA.  Aortic atherosclerosis and tortuosity.  Cardiomegaly without CHF  Small hiatal hernia  Trace pleural effusions and bibasilar compressive atelectasis.   Electronically Signed   By: Ruel Favors M.D.   On: 08/12/2014 15:27   Dg Chest Port 1 View  08/11/2014   CLINICAL DATA:  Initial encounter for shortness of breath. Recent right humerus fracture.  EXAM: PORTABLE CHEST - 1 VIEW  COMPARISON:  08/09/2014  FINDINGS: Claudie Fisherman overlies the apices. Reversed apical lordotic positioning. Moderate cardiomegaly with a tortuous atherosclerotic aorta. No definite pleural fluid. No pneumothorax. Diminished lung volumes. Suspect mild pulmonary venous congestion, without overt congestive failure. Developing bibasilar opacities.  IMPRESSION: Cardiomegaly with suspicion of mild pulmonary venous congestion.  Bibasilar opacities which most likely represent atelectasis (or less likely overlying breast tissue). Consider short term radiographic followup to exclude early pneumonia.   Electronically Signed   By: Jeronimo Greaves M.D.   On: 08/11/2014 16:41    Scheduled Meds: . acetaminophen  650 mg Oral TID  . ALPRAZolam  0.5 mg Oral QHS  . amLODipine  5 mg Oral Daily  . calcium carbonate  1,250 mg Oral Q breakfast  . carvedilol  3.125 mg  Oral BID  . Chlorhexidine Gluconate Cloth  6 each Topical Q0600  . docusate sodium  100 mg Oral BID  . enoxaparin (LOVENOX) injection  30 mg Subcutaneous Q24H  . feeding supplement (GLUCERNA SHAKE)  237 mL Oral TID BM  . ferrous sulfate  325 mg Oral BID WC  . levofloxacin (LEVAQUIN) IV  750 mg Intravenous Q48H  . loratadine  10 mg Oral Daily  . multivitamin with minerals  1 tablet Oral Daily  . mupirocin ointment  1 application Nasal BID  . pantoprazole  40 mg Oral Daily  . sodium chloride  3 mL Intravenous Q12H  . [START ON 08/16/2014] Vitamin D (Ergocalciferol)  50,000 Units  Oral Q7 days   Continuous Infusions: . sodium chloride      Active Problems:   HTN (hypertension)   Protein-calorie malnutrition, severe   Humerus fracture   Hypoxia   Thrombocytopenia   Elevated troponin   Nausea with vomiting   HCAP (healthcare-associated pneumonia)   Closed right humeral fracture    Time spent: 35 mintues    [facility] M  [facility] Pager [phone]. If 7PM-7AM, please contact night-coverage at [email or web address], password [facility] 08/13/2014, 10:44 AM  LOS: 2 days

## 2014-08-13 NOTE — Clinical Social Work Note (Signed)
CSW presented bed offers and [facility]. [facility] Anticipate d/c tomorrow per MD.  Derenda FennelKara Jaimon Bugaj, LCSW [phone]

## 2014-08-13 NOTE — Clinical Social Work Placement (Signed)
Clinical Social Work Department CLINICAL SOCIAL WORK PLACEMENT NOTE 08/13/2014  Patient:  Briana Schultz,Briana Schultz  Account Number:  [id number] Admit date:  08/11/2014  Clinical Social Worker:  Robin SearingJANET CALDWELL, LCSWA  Date/time:  08/12/2014 02:00 PM  Clinical Social Work is seeking post-discharge placement for this patient at the following level of care:   SKILLED NURSING   (*CSW will update this form in Epic as items are completed)   08/12/2014  Patient/family provided with [facility] Department of Clinical Social Work's list of facilities offering this level of care within the geographic area requested by the patient (or if unable, by the patient's family).  08/12/2014  Patient/family informed of their freedom to choose among providers that offer the needed level of care, that participate in Medicare, Medicaid or managed care program needed by the patient, have an available bed and are willing to accept the patient.  08/12/2014  Patient/family informed of [facility]' ownership interest in [facility], as well as of the fact that they are under no obligation to receive care at this facility.  PASARR submitted to EDS on 08/12/2014 PASARR number received on 08/12/2014  FL2 transmitted to all facilities in geographic area requested by pt/family on  08/12/2014 FL2 transmitted to all facilities within larger geographic area on   Patient informed that his/her managed care company has contracts with or will negotiate with  certain facilities, including the following:     Patient/family informed of bed offers received:  08/13/2014 [facility] Physician recommends and patient chooses bed at    Patient to be transferred to  on   Patient to be transferred to facility by  Patient and family notified of transfer on  Name of family member notified:    The following physician request were entered in Epic:   Additional Comments:  Derenda FennelKara Angelli Baruch,  LCSW [phone]

## 2014-08-14 ENCOUNTER — Inpatient Hospital Stay
Admission: RE | Admit: 2014-08-14 | Discharge: 2014-09-06 | Disposition: A | Discharge status: Home / Self Care | Payer: Medicare Other | Source: Ambulatory Visit | Attending: Internal Medicine | Admitting: Internal Medicine

## 2014-08-14 LAB — BASIC METABOLIC PANEL
Anion gap: 7 (ref 5–15)
BUN: 32 mg/dL — ABNORMAL HIGH (ref 6–23)
CALCIUM: 8.7 mg/dL (ref 8.4–10.5)
CO2: 38 mmol/L — ABNORMAL HIGH (ref 19–32)
Chloride: 97 mmol/L (ref 96–112)
Creatinine, Ser: 0.62 mg/dL (ref 0.50–1.10)
GFR calc Af Amer: 84 mL/min — ABNORMAL LOW (ref >90)
GFR calc non Af Amer: 72 mL/min — ABNORMAL LOW (ref >90)
Glucose, Bld: 105 mg/dL — ABNORMAL HIGH (ref 70–99)
Potassium: 3.8 mmol/L (ref 3.5–5.1)
SODIUM: 142 mmol/L (ref 135–145)

## 2014-08-14 LAB — CBC
HEMATOCRIT: 28.7 % — AB (ref 36.0–46.0)
Hemoglobin: 9.3 g/dL — ABNORMAL LOW (ref 12.0–15.0)
MCH: 32.5 pg (ref 26.0–34.0)
MCHC: 32.4 g/dL (ref 30.0–36.0)
MCV: 100.3 fL — ABNORMAL HIGH (ref 78.0–100.0)
Platelets: 172 10*3/uL (ref 150–400)
RBC: 2.86 MIL/uL — ABNORMAL LOW (ref 3.87–5.11)
RDW: 13.8 % (ref 11.5–15.5)
WBC: 7 10*3/uL (ref 4.0–10.5)

## 2014-08-14 MED ORDER — ALPRAZOLAM 0.5 MG PO TABS: ORAL_TABLET | ORAL | Status: DC

## 2014-08-14 MED ORDER — FENTANYL CITRATE 0.05 MG/ML IJ SOLN
25.0000 ug | Freq: Once | INTRAMUSCULAR | Status: AC
  Administered 2014-08-14: 25 ug via INTRAVENOUS
  Filled 2014-08-14: qty 2

## 2014-08-14 MED ORDER — ALPRAZOLAM 0.25 MG PO TABS
0.2500 mg | ORAL_TABLET | Freq: Once | ORAL | Status: AC
  Administered 2014-08-14: 0.25 mg via ORAL
  Filled 2014-08-14: qty 1

## 2014-08-14 NOTE — Clinical Social Work Placement (Signed)
Clinical Social Work Department CLINICAL SOCIAL WORK PLACEMENT NOTE 08/14/2014  Patient:  Briana Schultz,[place] M  Account Number:  [id number] Admit date:  08/11/2014  Clinical Social Worker:  Robin SearingJANET CALDWELL, LCSWA  Date/time:  08/12/2014 02:00 PM  Clinical Social Work is seeking post-discharge placement for this patient at the following level of care:   SKILLED NURSING   (*CSW will update this form in Epic as items are completed)   08/12/2014  Patient/family provided with [facility] Department of Clinical Social Work's list of facilities offering this level of care within the geographic area requested by the patient (or if unable, by the patient's family).  08/12/2014  Patient/family informed of their freedom to choose among providers that offer the needed level of care, that participate in Medicare, Medicaid or managed care program needed by the patient, have an available bed and are willing to accept the patient.  08/12/2014  Patient/family informed of [facility] ownership interest [facility], as well as of the fact that they are under no obligation to receive care at this facility.  PASARR submitted to EDS on 08/12/2014 PASARR number received on 08/12/2014  FL2 transmitted to all facilities in geographic area requested by pt/family on  08/12/2014 FL2 transmitted to all facilities within larger geographic area on   Patient informed that his/her managed care company has contracts with or will negotiate with  certain facilities, including the following:     Patient/family informed of bed offers received:  08/13/2014 [facility] Physician recommends and patient chooses bed at    [facility] on  08/14/2014 Patient to be transferred to facility by RN Patient and family notified of transfer on 08/14/2014 Name of family member notified:  Rivka BarbaraGlenda- daughter  The following physician request were entered in  Epic:   Additional Comments:  Derenda FennelKara Billyjack Trompeter, [place] [phone]

## 2014-08-14 NOTE — Clinical Social Work Note (Signed)
[facility] Pt's daughter and facility aware and agreeable. D/C summary faxed. Pt to transfer with RN.  Derenda FennelKara Eustacio Ellen, [place] [phone]

## 2014-08-14 NOTE — Progress Notes (Signed)
Briana Schultz [facility] per MD order.  Report called to receiving RN Marchelle FolksAmanda at [phone].     Medication List    TAKE these medications        acetaminophen 325 MG tablet  Commonly known as:  TYLENOL  Take 2 tablets (650 mg total) by mouth 3 (three) times daily.     ALPRAZolam 0.5 MG tablet  Commonly known as:  XANAX  Take one tablet by mouth at bedtime to help rest     alum & mag hydroxide-simeth 200-200-20 MG/5ML suspension  Commonly known as:  MAALOX/MYLANTA  Take 30 mLs by mouth 2 (two) times daily as needed for indigestion.     amLODipine 5 MG tablet  Commonly known as:  NORVASC  Take 5 mg by mouth daily.     calcium carbonate 600 MG Tabs tablet  Commonly known as:  OS-CAL  Take 1,200 mg by mouth daily with breakfast.     carvedilol 3.125 MG tablet  Commonly known as:  COREG  Take 1 tablet (3.125 mg total) by mouth 2 (two) times daily.     docusate sodium 100 MG capsule  Commonly known as:  COLACE  Take 1 capsule (100 mg total) by mouth 2 (two) times daily. Continue this while taking narcotics to help with bowel movements     esomeprazole 40 MG capsule  Commonly known as:  NEXIUM  Take 40 mg by mouth daily.     feeding supplement (GLUCERNA SHAKE) Liqd  Take 237 mLs by mouth 3 (three) times daily between meals.     ferrous sulfate 325 (65 FE) MG tablet  Take 1 tablet (325 mg total) by mouth 2 (two) times daily with a meal.     loratadine 10 MG tablet  Commonly known as:  CLARITIN  Take 10 mg by mouth daily.     meloxicam 7.5 MG tablet  Commonly known as:  MOBIC  Take 1 tablet by mouth 2 (two) times daily.     multivitamin with minerals Tabs tablet  Take 1 tablet by mouth daily.     promethazine 25 MG tablet  Commonly known as:  PHENERGAN  Take 0.5 tablets (12.5 mg total) by mouth every 4 (four) hours as needed for nausea or vomiting.     Vitamin D (Ergocalciferol) 50000 UNITS Caps capsule  Commonly known as:  DRISDOL  Take 50,000  Units by mouth every 7 (seven) days. Take on Friday.         IV site discontinued and catheter remains intact. Site without signs and symptoms of complications. Dressing and pressure applied.  [facility] by [place], RN,  no distress noted upon discharge.  Briana Schultz, Briana Schultz 08/14/2014 4:09 PM

## 2014-08-14 NOTE — Discharge Summary (Signed)
Physician Discharge Summary  Briana Schultz:[id number] DOB: [birth year] DOA: 08/11/2014  PCP: Pamelia Hoit, MD  Admit date: 08/11/2014 Discharge date: 08/14/2014  Time spent: 40 minutes  Recommendations for Outpatient Follow-up:  1. Follow up with PCP 1-2 weeks for post hospital  2. Left shoulder immobilized x 3 weeks and follow up with Dr Romeo Apple 3 weeks 3. Discharge to [facility] nursing center 4. Of note opiate pain medications cause nausea and vomiting 5. Patient needs nasal cannula at 2L/m. Recommend humidifying for comfort  Discharge Diagnoses:  Active Problems:   HTN (hypertension)   Protein-calorie malnutrition, severe   Humerus fracture   Hypoxia   Thrombocytopenia   Elevated troponin   Nausea with vomiting   HCAP (healthcare-associated pneumonia)   Closed right humeral fracture   Alkalosis   Dyspnea   Discharge Condition: stable  Diet recommendation: regular needs assist with feeds  Filed Weights   08/11/14 1912 08/13/14 0515 08/14/14 0545  Weight: 44.906 kg (99 lb) 37.694 kg (83 lb 1.6 oz) 41.549 kg (91 lb 9.6 oz)    History of present illness:  Briana Schultz  81 year old lady who was discharged from the hospital 08/10/14. She had been admitted with hypoxia which was felt to be due to atelectasis and also had a humerus fracture. She was discharged to skilled nursing facility. She returned 08/11/14 because of worsening hypoxia and shallow respirations. The patient denied cough or fever. There was no nausea, vomiting, abdominal pain, chest pain, palpitations. Evaluation in the emergency room showed a chest x-ray which was consistent with either pulmonary edema or or atelectasis. There was a suggestion of a possibility of infective source.   Hospital Course:  1.Hypoxia. At baseline at discharge. Likely multifactorial i.e. Atelectasis and possible early pna. WBC 18 on admission quickly normalized. She remained afebrile and non-toxic appearing.CT chest negative for  PE. Of note, patient recently discharged from hospital on oxygen. It appeared some confusion at facility with regards to need for oxygen and/or the staff's knowledge of her needing oxygen. She received antibiotics for 3 days. Encourage IS.   2. Nausea with vomiting. Chart review indicates history of same with narcotics. No vomiting this hospitalization. Tolerated 60% breakfast without problem.   2. Comminuted fracture of the proximal humerus. Stable. Sling in place. Follow with orthopedics as an outpatient in 3 weeks. . 3. Chronic diastolic congestive heart failure, clinically not decompensated.   4. Moderate protein calorie malnutrition. BMI 19.4 Nutritional supplement. 5. Chronic atrial fibrillation, not on anticoagulation therapy. She remained in sinus rhythm.  6. Elevated troponin, mild and stable x 3. Denied chest pain.  7. Thrombocytopenia. Mild. Hx of intermittent. No s/sx bleeding. monitor 8. Frequent falls: recently placed snf. Patient also takes phenergan and xanax which could be contributing 9. Metabolic alkalosis: improving at discharge. Related to decreased po intak and shallow respirations in setting of atelectasis resulting in chronic hypoxia.    Procedures:  none  Consultations:  none  Discharge Exam: Filed Vitals:   08/14/14 0545  BP: 150/49  Pulse: 81  Temp: 97.7 F (36.5 C)  Resp: 20    General: thin frail appears comfortable Cardiovascular: RRR No MGR No LE edema Respiratory: normal effort BS distant but clear  Discharge Instructions    Current Discharge Medication List    CONTINUE these medications which have NOT CHANGED   Details  acetaminophen (TYLENOL) 325 MG tablet Take 2 tablets (650 mg total) by mouth 3 (three) times daily.    ALPRAZolam (XANAX) 0.5 MG  tablet Take one tablet by mouth at bedtime to help rest Qty: 30 tablet, Refills: 0    alum & mag hydroxide-simeth (MAALOX/MYLANTA) 200-200-20 MG/5ML suspension Take 30 mLs by mouth 2 (two)  times daily as needed for indigestion.    amLODipine (NORVASC) 5 MG tablet Take 5 mg by mouth daily.    calcium carbonate (OS-CAL) 600 MG TABS Take 1,200 mg by mouth daily with breakfast.     carvedilol (COREG) 3.125 MG tablet Take 1 tablet (3.125 mg total) by mouth 2 (two) times daily. Qty: 180 tablet, Refills: 3    docusate sodium (COLACE) 100 MG capsule Take 1 capsule (100 mg total) by mouth 2 (two) times daily. Continue this while taking narcotics to help with bowel movements Qty: 30 capsule, Refills: 1    esomeprazole (NEXIUM) 40 MG capsule Take 40 mg by mouth daily.     feeding supplement (GLUCERNA SHAKE) LIQD Take 237 mLs by mouth 3 (three) times daily between meals.     ferrous sulfate 325 (65 FE) MG tablet Take 1 tablet (325 mg total) by mouth 2 (two) times daily with a meal. Refills: 3    loratadine (CLARITIN) 10 MG tablet Take 10 mg by mouth daily.    Multiple Vitamin (MULTIVITAMIN WITH MINERALS) TABS Take 1 tablet by mouth daily.    Vitamin D, Ergocalciferol, (DRISDOL) 50000 UNITS CAPS capsule Take 50,000 Units by mouth every 7 (seven) days. Take on Friday.    meloxicam (MOBIC) 7.5 MG tablet Take 1 tablet by mouth 2 (two) times daily. Refills: 4    promethazine (PHENERGAN) 25 MG tablet Take 0.5 tablets (12.5 mg total) by mouth every 4 (four) hours as needed for nausea or vomiting. Qty: 30 tablet, Refills: 0       Allergies  Allergen Reactions  . Amoxicillin Other (See Comments)    unknown  . Doxycycline Other (See Comments)    unknown  . Fosamax [Alendronate] Other (See Comments)    unknown  . Septra [Sulfamethoxazole-Trimethoprim] Other (See Comments)    unknown  . Tramadol     disoriented   Follow-up Information    Follow up with Pamelia HoitWILSON,FRED HENRY, MD. Schedule an appointment as soon as possible for a visit in 2 weeks.   Specialty:  Family Medicine   Contact information:   [address] [place] [id number] [phone]        The results of  significant diagnostics from this hospitalization (including imaging, microbiology, ancillary and laboratory) are listed below for reference.    Significant Diagnostic Studies: Dg Shoulder Right  08/09/2014   CLINICAL DATA:  Pain following fall getting out of bed  EXAM: RIGHT SHOULDER - 2+ VIEW  COMPARISON:  None.  FINDINGS: Frontal, Y scapular, and oblique views were obtained. There is a comminuted fracture of the proximal humeral metaphysis with impaction at the fracture site. There is avulsion and greater tuberosity region. No other fractures. No dislocation. Moderate generalized osteoarthritic change present.  IMPRESSION: Comminuted fracture proximal humeral metaphysis with impaction. Avulsion greater tuberosity. No dislocation.   Electronically Signed   By: Bretta BangWilliam  Woodruff M.D.   On: 08/09/2014 07:01   Ct Angio Chest Pe W/cm &/or Wo Cm  08/12/2014   CLINICAL DATA:  Acute hypoxia, shortness of breath  EXAM: CT ANGIOGRAPHY CHEST WITH CONTRAST  TECHNIQUE: Multidetector CT imaging of the chest was performed using the standard protocol during bolus administration of intravenous contrast. Multiplanar CT image reconstructions and MIPs were obtained to evaluate the vascular  anatomy.  CONTRAST:  OMNIPAQUE IOHEXOL 350 MG/ML SOLN  COMPARISON:  01/02/2014  FINDINGS: Pulmonary arteries are well visualized. No significant filling defect or pulmonary embolus demonstrated by CTA. Atherosclerosis noted of the ectatic tortuous thoracic aorta. Mild cardiomegaly evident. No pericardial effusion. Small hiatal hernia noted. Negative for adenopathy. small pleural effusions noted, slightly larger on the right.  The lung windows demonstrate chronic apical pleural parenchymal scarring. Mild hyperinflation. Minor dependent bibasilar subsegmental compressive atelectasis. No focal pneumonia, consolidation, or definite pneumonia. Negative for pneumothorax.  Included upper abdomen demonstrates scattered hypodense renal cysts.  Contrast refluxes into IVC and hepatic veins, suggesting right heart failure.  Diffuse degenerative changes of thoracic spine with increased thoracic kyphosis. Motion artifact noted across sternum.  Review of the MIP images confirms the above findings.  IMPRESSION: Negative for significant acute pulmonary embolus by CTA.  Aortic atherosclerosis and tortuosity.  Cardiomegaly without CHF  Small hiatal hernia  Trace pleural effusions and bibasilar compressive atelectasis.   Electronically Signed   By: Ruel Favors M.D.   On: 08/12/2014 15:27   Dg Chest Port 1 View  08/11/2014   CLINICAL DATA:  Initial encounter for shortness of breath. Recent right humerus fracture.  EXAM: PORTABLE CHEST - 1 VIEW  COMPARISON:  08/09/2014  FINDINGS: Claudie Fisherman overlies the apices. Reversed apical lordotic positioning. Moderate cardiomegaly with a tortuous atherosclerotic aorta. No definite pleural fluid. No pneumothorax. Diminished lung volumes. Suspect mild pulmonary venous congestion, without overt congestive failure. Developing bibasilar opacities.  IMPRESSION: Cardiomegaly with suspicion of mild pulmonary venous congestion.  Bibasilar opacities which most likely represent atelectasis (or less likely overlying breast tissue). Consider short term radiographic followup to exclude early pneumonia.   Electronically Signed   By: Jeronimo Greaves M.D.   On: 08/11/2014 16:41   Dg Chest Port 1 View  08/09/2014   CLINICAL DATA:  Fall, hypertension.  EXAM: CHEST  1 VIEW  COMPARISON:  12/30/2013  FINDINGS: Cardiomegaly. No confluent airspace opacities or effusions. Hyperinflation. No edema.  There is a fracture through the right humeral neck and greater tuberosity.  IMPRESSION: Cardiomegaly, hyperinflation.  No active cardiopulmonary disease.  Right humeral neck/greater tuberosity fracture.   Electronically Signed   By: Charlett Nose M.D.   On: 08/09/2014 11:25   Dg Humerus Right  08/09/2014   CLINICAL DATA:  Pain following fall getting out of bed   EXAM: RIGHT HUMERUS - 2+ VIEW  COMPARISON:  None.  FINDINGS: Frontal and lateral views were obtained. There is a comminuted fracture of the proximal humeral metaphysis with impaction at the fracture site. There is avulsion of the greater tuberosity. No other fractures. No dislocation. Bones are osteoporotic.  IMPRESSION: Comminuted fracture proximal humeral metaphysis with avulsion of the greater tuberosity. Impaction is noted at fracture site. No dislocation. Bones osteoporotic.   Electronically Signed   By: Bretta Bang M.D.   On: 08/09/2014 07:02    Microbiology: Recent Results (from the past 240 hour(s))  MRSA PCR Screening     Status: Abnormal   Collection Time: 08/11/14  2:34 AM  Result Value Ref Range Status   MRSA by PCR POSITIVE (A) NEGATIVE Final    Comment:        The GeneXpert MRSA Assay (FDA approved for NASAL specimens only), is one component of a comprehensive MRSA colonization surveillance program. It is not intended to diagnose MRSA infection nor to guide or monitor treatment for MRSA infections. RESULT CALLED TO, READ BACK BY AND VERIFIED WITH:  MARTIN,S @  0423 ON 08/12/14 BY WOODIE,J   Urine culture     Status: None   Collection Time: 08/11/14  4:55 PM  Result Value Ref Range Status   Specimen Description URINE, CATHETERIZED  Final   Special Requests NONE  Final   Colony Count NO GROWTH Performed at [facility]   Final   Culture NO GROWTH Performed at [facility]   Final   Report Status 08/13/2014 FINAL  Final     Labs: Basic Metabolic Panel:  Recent Labs Lab 08/10/14 0637 08/11/14 1530 08/12/14 0421 08/13/14 0730 08/14/14 0526  NA 142 139 138 139 142  K 4.7 3.9 3.1* 3.7 3.8  CL 105 97 94* 97 97  CO2 32 37* 35* 40* 38*  GLUCOSE 130* 168* 151* 120* 105*  BUN 25* 26* 23 33* 32*  CREATININE 0.89 0.65 0.61 0.68 0.62  CALCIUM 8.7 9.0 8.9 9.0 8.7   Liver Function Tests:  Recent Labs Lab 08/11/14 1530 08/12/14 0421  AST 18  24  ALT 15 16  ALKPHOS 62 61  BILITOT 1.2 1.8*  PROT 6.9 6.9  ALBUMIN 3.6 3.5    Recent Labs Lab 08/11/14 1530  LIPASE 18   No results for input(s): AMMONIA in the last 168 hours. CBC:  Recent Labs Lab 08/09/14 0938 08/10/14 0637 08/11/14 1530 08/12/14 0421 08/13/14 0730 08/14/14 0526  WBC 12.6* 9.3 11.7* 18.7* 7.5 7.0  NEUTROABS 11.2*  --  10.6*  --   --   --   HGB 11.0* 9.1* 10.3* 10.9* 9.3* 9.3*  HCT 34.1* 28.7* 31.8* 33.4* 27.9* 28.7*  MCV 98.0 101.1* 99.4 96.5 98.9 100.3*  PLT 162 139* 147* 208 146* 172   Cardiac Enzymes:  Recent Labs Lab 08/11/14 1530 08/11/14 1755 08/12/14 0042 08/12/14 0421  TROPONINI 0.05* 0.05* 0.06* 0.05*   BNP: BNP (last 3 results)  Recent Labs  08/11/14 1530  BNP 879.0*    ProBNP (last 3 results)  Recent Labs  01/02/14 1212  PROBNP 456.0*    CBG:  Recent Labs Lab 08/12/14 1133  GLUCAP 205*       Signed:  BLACK,KAREN M  [facility] 08/14/2014, 11:34 AM

## 2014-08-15 ENCOUNTER — Other Ambulatory Visit: Payer: Self-pay | Admitting: *Deleted

## 2014-08-15 ENCOUNTER — Encounter: Payer: Self-pay | Admitting: Orthopedic Surgery

## 2014-08-15 DIAGNOSIS — S42201A Unspecified fracture of upper end of right humerus, initial encounter for closed fracture: Secondary | ICD-10-CM | POA: Insufficient documentation

## 2014-08-15 MED ORDER — ALPRAZOLAM 0.5 MG PO TABS: ORAL_TABLET | ORAL | Status: DC

## 2014-08-15 NOTE — Telephone Encounter (Signed)
[facility] 

## 2014-08-15 NOTE — Care Management Utilization Note (Signed)
UR completed 

## 2014-08-19 ENCOUNTER — Non-Acute Institutional Stay (SKILLED_NURSING_FACILITY): Payer: Medicare Other | Admitting: Internal Medicine

## 2014-08-19 DIAGNOSIS — W19XXXA Unspecified fall, initial encounter: Secondary | ICD-10-CM

## 2014-08-19 DIAGNOSIS — R29898 Other symptoms and signs involving the musculoskeletal system: Secondary | ICD-10-CM

## 2014-08-19 DIAGNOSIS — J9601 Acute respiratory failure with hypoxia: Secondary | ICD-10-CM

## 2014-08-19 DIAGNOSIS — D519 Vitamin B12 deficiency anemia, unspecified: Secondary | ICD-10-CM

## 2014-08-20 NOTE — Progress Notes (Addendum)
Patient ID: Briana Schultz, female   DOB: [birth year], 81 y.o.   MRN: [id number]               HISTORY & PHYSICAL  DATE:  08/19/2014              FACILITY: [facility]        LEVEL OF CARE:   SNF   CHIEF COMPLAINT:  Admission to SNF, post stay at [facility], 08/09/2014 through 08/10/2014, and then again from 08/11/2014 through 08/14/2014.                   HISTORY OF PRESENT ILLNESS:    This is a patient who lives at home, cared for by her daughter.  She was a patient here, I believe, in the summer with a right hip fracture.    Apparently 10 days ago, she got up to go to the bathroom, fell, and suffered a comminuted fracture of the right humerus.  This is managed conservatively.  She was seen by Dr. Romeo Apple.      She was discharged to a nursing home, lasted less than a day, and then she was readmitted to hospital with hypoxemia.  It was felt that this was secondary to atelectasis and/or possibly early pneumonia.  Her white count was 18 on admission, which quickly normalized.  A CT scan of the chest was negative for PE, showed a small pleural effusion and atelectasis.    She was also felt to have chronic diastolic heart failure, clinically not decompensated.  She did have an elevated troponin, which was mild and stable x3.  I do not think this was felt to be secondary to coronary artery disease.    A urine culture was done that was negative.    It is noted that there is interpretation of metabolic alkalosis, I think related to elevated CO2s on her chemical panels.  This was as high as 40 on 08/13/2014.  I think an alternative explanation for this would be that the CO2 reflects her kidney compensation for chronic respiratory acidosis.    PAST MEDICAL HISTORY/PROBLEM LIST:                         Hypertension.    Aortic valve stenosis.    Syncope and collapse.    Osteoarthritis.    Gastroesophageal reflux disease.    Osteoporosis.    History of scarlet fever.     Significant cervical and thoracic kyphosis.    TIA.      Right femur fracture.    Atrial fibrillation.  Not on anticoagulation due to fall risk.       Diastolic heart failure.    Proximal right humerus fracture.    On home O2 at 2 L.    Thrombocytopenia.     Malnutrition.    Frequent falls.    Mild dementia.    DNR status.    PAST SURGICAL HISTORY:             Cholecystectomy.    Cataract extraction.    Right femur fracture in June 2015.    CURRENT MEDICATIONS:  Discharge medications include:        Xanax 0.5 q.h.s. p.r.n., which she has apparently been on long-term.    Tylenol 650 three times daily routinely.    Norvasc 5 q.d.       Calcium carbonate 1200 daily.    Coreg 3.125 b.i.d.  Colace 100 daily.     Nexium 40 q.d.      Glucerna three times daily.      Ferrous sulfate 325 b.i.d.      Claritin 10 q.d.       Vitamin D 50,000 U every seven days.     Mobic 7.5 b.i.d.       Phenergan 12.5 every 4 hours p.r.n.       ALLERGIES/INTOLERANCES:   Allergies noted to virtually all forms of narcotics, which either cause nausea or disorientation.  Even tramadol apparently made her disorientated during her stay in June.    She also has noted allergies to:    Amoxicillin.     Doxycycline.    Septra.    Fosamax.    SOCIAL HISTORY:                         HOUSING:  The patient lives with her daughter, who is her primary caregiver.   FUNCTIONAL STATUS:  She needs assistance with basic ADLs including dressing and toileting, although she is continent.  She uses a walker at home.    FAMILY HISTORY:      FATHER:  Heart failure in her father, as well as diabetes.    REVIEW OF SYSTEMS:            CHEST/RESPIRATORY:  She is not complaining of shortness of breath.   CARDIAC:   No chest pain.    GI:  No nausea, vomiting or diarrhea.    GU:  No dysuria.    MUSCULOSKELETAL:   Complaining of severe pain in the right arm.    PHYSICAL EXAMINATION:             VITAL SIGNS:   O2 SATURATIONS:  94% on room air.   PULSE:  88 and regular.   RESPIRATIONS:  32 and shallow, but there is no accessory muscle use.   GENERAL APPEARANCE:  Frail, but alert woman in no distress.    CHEST/RESPIRATORY:  Severe thoracic kyphosis with very shallow air entry at the bases.   CARDIOVASCULAR:  CARDIAC:  Heart sounds are actually regular.  There are no murmurs.  She appears to be euvolemic.  She does not have elevation of her jugular venous pressure.   GASTROINTESTINAL:  ABDOMEN:   Soft.  No masses.   LIVER/SPLEEN/KIDNEYS:  No liver, no spleen.   GENITOURINARY:  BLADDER:   Not distended.  There is no CVA tenderness.    VASCULAR:   ARTERIAL:  Peripheral pulses are difficult to feel below the popliteal.  The popliteals are palpable.   CIRCULATION:   EDEMA/VARICOSITIES:  She has bilateral severe venous stasis.    However, no wounds are appreciated.  There is no appreciable DVT.   SKIN:  INSPECTION:  No pressure areas are seen in the usual areas.   She does have a blackened area on the tip of her left great toe.  I wonder whether this is all pressure-related versus embolic.   MUSCULOSKELETAL:   EXTREMITIES:   BILATERAL LOWER EXTREMITIES:  She has markedly unstable knees, especially the medial collateral ligament on the right greater than the left.   BILATERAL UPPER EXTREMITIES:  The right arm is in an immobilizer.   NEUROLOGICAL:    SENSATION/STRENGTH:  She does have antigravity strength bilaterally, although the left leg is a lot stronger than the right.  I suspect there is mild to moderate proximal muscle weakness bilaterally in her legs.  PSYCHIATRIC:   MENTAL STATUS:   The patient seems alert, awake.  She is aware of her surroundings.  She is listed as having mild dementia.  No significant overt delirium.  Her daughter states she is very depressed about being here.  This is, no doubt, an adjustment disorder.  I would not consider antidepressants at this  point.    ASSESSMENT/PLAN:                      Recent right humeral fracture.  This is, of course, significantly independence-threatening, especially ADLs.  Manage conservatively.  Now with a sling.  Followed by Dr. Romeo Apple.    Admission with hypoxemia, elevated white count.  A CT scan did not show a PE or pneumonia.  The white count stabilized.  I suspect that her hypoxemia is related to predominantly extrathoracic cage compression with concomitant atelectasis.  Her elevated CO2s on her chemistry panels might reflect compensation for chronic respiratory acidosis.  I am not certain about this.  However, this will need to be followed.  I do not believe this was contraction alkalosis.      Lower extremity weakness bilaterally.  Her right leg (previous operation in June 2015) is worse than the left.  She has been immobilized for 10 days.   She will need to get up, both for diffuse weakness as well as balance issues.    Hypertension.  We will monitor while she is here.    Pain issues.  Apparently, she does not tolerate narcotics, even Ultram.  She is on routine acetaminophen as well as routine NSAIDs (Mobic).  The only option I have here is to increase her acetaminophen.    Anemia.  On iron.  She was discharged with a hemoglobin of 9.3.  I note that her MCV, if anything, is elevated.  We will see if there is a B12 and folate.    History of falls, although the patient's daughter denies that this has been recently true.  She uses a walker at home.  It will not be easy for this until the arm is closer to healing.    Listed as having moderate protein calorie malnutrition.  Her albumin was 3.6 on 08/11/2014.                                              ADDENDUM:  I do not see a B12 level.  I have discussed this entire situation in depth with the daughter.

## 2014-08-22 ENCOUNTER — Telehealth: Payer: Self-pay | Admitting: Orthopedic Surgery

## 2014-08-22 NOTE — Telephone Encounter (Signed)
Consult notes including Xray reports faxed as per request to primary insurer,[employer],attention:Utilization Management, Toniann FailWendy, to fax #[phone] Per phone conversation (ph [phone])08/20/14 regarding hospital care provided by our provider Dr Romeo AppleHarrison [facility]

## 2014-08-26 ENCOUNTER — Encounter (HOSPITAL_COMMUNITY)
Admission: RE | Admit: 2014-08-26 | Discharge: 2014-08-26 | Disposition: A | Discharge status: Home / Self Care | Payer: Medicare Other | Source: Ambulatory Visit | Attending: Internal Medicine | Admitting: Internal Medicine

## 2014-08-26 LAB — CBC WITH DIFFERENTIAL/PLATELET
Basophils Absolute: 0 10*3/uL (ref 0.0–0.1)
Basophils Relative: 0 % (ref 0–1)
EOS ABS: 0.2 10*3/uL (ref 0.0–0.7)
EOS PCT: 2 % (ref 0–5)
HCT: 33.1 % — ABNORMAL LOW (ref 36.0–46.0)
HEMOGLOBIN: 10.5 g/dL — AB (ref 12.0–15.0)
LYMPHS ABS: 1 10*3/uL (ref 0.7–4.0)
Lymphocytes Relative: 14 % (ref 12–46)
MCH: 31.9 pg (ref 26.0–34.0)
MCHC: 31.7 g/dL (ref 30.0–36.0)
MCV: 100.6 fL — ABNORMAL HIGH (ref 78.0–100.0)
MONOS PCT: 6 % (ref 3–12)
Monocytes Absolute: 0.5 10*3/uL (ref 0.1–1.0)
NEUTROS PCT: 78 % — AB (ref 43–77)
Neutro Abs: 5.4 10*3/uL (ref 1.7–7.7)
PLATELETS: 235 10*3/uL (ref 150–400)
RBC: 3.29 MIL/uL — ABNORMAL LOW (ref 3.87–5.11)
RDW: 14.4 % (ref 11.5–15.5)
WBC: 7.1 10*3/uL (ref 4.0–10.5)

## 2014-08-26 LAB — COMPREHENSIVE METABOLIC PANEL
ALBUMIN: 3.2 g/dL — AB (ref 3.5–5.2)
ALK PHOS: 139 U/L — AB (ref 39–117)
ALT: 15 U/L (ref 0–35)
AST: 20 U/L (ref 0–37)
Anion gap: 7 (ref 5–15)
BUN: 17 mg/dL (ref 6–23)
CO2: 33 mmol/L — ABNORMAL HIGH (ref 19–32)
Calcium: 9 mg/dL (ref 8.4–10.5)
Chloride: 103 mmol/L (ref 96–112)
Creatinine, Ser: 0.55 mg/dL (ref 0.50–1.10)
GFR calc Af Amer: 87 mL/min — ABNORMAL LOW (ref >90)
GFR calc non Af Amer: 75 mL/min — ABNORMAL LOW (ref >90)
Glucose, Bld: 108 mg/dL — ABNORMAL HIGH (ref 70–99)
Potassium: 3.8 mmol/L (ref 3.5–5.1)
SODIUM: 143 mmol/L (ref 135–145)
TOTAL PROTEIN: 5.9 g/dL — AB (ref 6.0–8.3)
Total Bilirubin: 0.8 mg/dL (ref 0.3–1.2)

## 2014-08-26 LAB — FOLATE: Folate: >20 ng/mL

## 2014-08-26 LAB — VITAMIN B12: Vitamin B-12: 1097 pg/mL — ABNORMAL HIGH (ref 211–911)

## 2014-08-27 ENCOUNTER — Non-Acute Institutional Stay (SKILLED_NURSING_FACILITY): Payer: Medicare Other | Admitting: Internal Medicine

## 2014-08-27 ENCOUNTER — Encounter: Payer: Self-pay | Admitting: Internal Medicine

## 2014-08-27 DIAGNOSIS — D649 Anemia, unspecified: Secondary | ICD-10-CM

## 2014-08-27 DIAGNOSIS — R0902 Hypoxemia: Secondary | ICD-10-CM

## 2014-08-27 DIAGNOSIS — I10 Essential (primary) hypertension: Secondary | ICD-10-CM | POA: Diagnosis not present

## 2014-08-27 DIAGNOSIS — S42301D Unspecified fracture of shaft of humerus, right arm, subsequent encounter for fracture with routine healing: Secondary | ICD-10-CM

## 2014-08-27 NOTE — Progress Notes (Signed)
Patient ID: Briana Schultz, female   DOB: [birth year], 81 y.o.   MRN: [id number]   This is an acute visit.  Level care skilled.  Facility [facility].  CHIEF COMPLAINT:  Acute visit secondary to follow-up hypertension-.                   HISTORY OF PRESENT ILLNESS:    This is a patient who lives at home, cared for by her daughter.  She was a patient here, I believe, in the summer with a right hip fracture.    Apparently 10 days ago, she got up to go to the bathroom, fell, and suffered a comminuted fracture of the right humerus.  This is managed conservatively.  She was seen by Dr. Romeo Apple.      She was discharged to a nursing home, lasted less than a day, and then she was readmitted to hospital with hypoxemia.  It was felt that this was secondary to atelectasis and/or possibly early pneumonia.  Her white count was 18 on admission, which quickly normalized.  A CT scan of the chest was negative for PE, showed a small pleural effusion and atelectasis.    She was also felt to have chronic diastolic heart failure, clinically not decompensated.  She did have an elevated troponin, which was mild and stable x3.  I do not think this was felt to be secondary to coronary artery disease.   .    It is noted that there is interpretation of metabolic alkalosis, most likely related to elevated CO2s on her chemical panels.  This was as high as 40 on 08/13/2014.--On updated lab done yesterday this appears improved with a CO2 of 33 respiratory wise she appears to be stable.  She also has a history of hypertension she is on Norvasc 5 mg a day as well as Coreg 3.125 mg twice a day nursing staff has noted elevated systolics often in the 150s I got actually 178/60 manually today--she does not complain of any headache or dizziness or chest pain.   Marland Kitchen    PAST MEDICAL HISTORY/PROBLEM LIST:                         Hypertension.    Aortic valve stenosis.    Syncope and collapse.    Osteoarthritis.     Gastroesophageal reflux disease.    Osteoporosis.    History of scarlet fever.    Significant cervical and thoracic kyphosis.    TIA.      Right femur fracture.    Atrial fibrillation.  Not on anticoagulation due to fall risk.       Diastolic heart failure.    Proximal right humerus fracture.    On home O2 at 2 L.    Thrombocytopenia.     Malnutrition.    Frequent falls.    Mild dementia.    DNR status.    PAST SURGICAL HISTORY:             Cholecystectomy.    Cataract extraction.    Right femur fracture in June 2015.    CURRENT MEDICATIONS:  Discharge medications include:        Xanax 0.5 q.h.s. p.r.n., which she has apparently been on long-term.    Tylenol 650 three times daily routinely.    Norvasc 5 q.d.       Calcium carbonate 1200 daily.    Coreg 3.125 b.i.d.      Colace 100  daily.     Nexium 40 q.d.      Glucerna three times daily.      Ferrous sulfate 325 b.i.d.      Claritin 10 q.d.       Vitamin D 50,000 U every seven days.     Mobic 7.5 b.i.d.       Phenergan 12.5 every 4 hours p.r.n.       ALLERGIES/INTOLERANCES:   Allergies noted to virtually all forms of narcotics, which either cause nausea or disorientation.  Even tramadol apparently made her disorientated during her stay in June.    She also has noted allergies to:    Amoxicillin.     Doxycycline.    Septra.    Fosamax.    SOCIAL HISTORY:                         HOUSING:  The patient lives with her daughter, who is her primary caregiver.   FUNCTIONAL STATUS:  She needs assistance with basic ADLs including dressing and toileting, although she is continent.  She uses a walker at home.    FAMILY HISTORY:      FATHER:  Heart failure in her father, as well as diabetes.    REVIEW OF SYSTEMS:  In general no complaints of fever or chills.  Eyes does not complain of visual changes           CHEST/RESPIRATORY:  She is not complaining of shortness of breath.   CARDIAC:    No chest pain.    GI:  No nausea, vomiting or diarrhea.    GU:  No dysuria.    MUSCULOSKELETAL: Arm pain appears to be somewhat better controlled on routine Tylenol    PHYSICAL EXAMINATION:             Temperature temperature 97.4 pulse 96 respirations 20 blood pressure taken manually 178/60 appear she has frequent systolics in the 150-160s range.  Marland Kitchen   GENERAL APPEARANCE:  Frail, but alert woman in no distress.    CHEST/RESPIRATORY:  Severe thoracic kyphosis with very shallow air entry at the bases.   CARDIOVASCULAR:  CARDIAC:  Heart sounds are actually regular.  There are no murmurs.  She appears to be euvolemic.  She does not have elevation of her jugular venous pressure---.   GASTROINTESTINAL:  ABDOMEN:   Soft.  No masses.-Nontender      t.   BILATERAL UPPER EXTREMITIES:  The right arm is in a a sling  --moves her extremities otherwise at baseline has significant lower extremity weakness.  Neurologic appears grossly intact her speech is clear.  Psych she is bright and alert largely appropriate listed as having dementia which has--- this is quite mild  Labs.  2- 22nd 2016.  Sodium 143 potassium 3.8 BUN 17 creatinine 0.55 CO2 was 33.  Liver function tests within normal limits except alkaline phosphatase minimally elevated at 139 albumin 3.2.  WBC 7.1 hemoglobin 10.5 platelets 235      ASSESSMENT/PLAN:                      Recent right humeral fracture.  .  Manage conservatively.  Now with a sling.  Followed by Dr. Romeo Apple.--Routine Tylenol is helping    Admission with hypoxemia, elevated white count.  A CT scan did not show a PE or pneumonia.  The white count stabilized.  I suspect that her hypoxemia is related to predominantly extrathoracic cage compression  with concomitant atelectasis.  Her elevated CO2s on her chemistry panels might reflect compensation for chronic respiratory acidosi-- appears stable CO2 has improved to 33 most recent lab--we'll update this next  week        Hypertension.  Systolics appear to be consistently elevated we'll increase Norvasc to 10 mg a day continues on Coreg 3.125 mg twice a day monitor blood pressures every shift.          Anemia.  On iron.  She was discharged with a hemoglobin of 9.3.  I note that her MCV, if anything, is elevated. B12 on recent lab was adequate Over 1000-Foley acid also was greater than 20 MCV on recent metabolic panel continues to be mildly elevated at 100.6--11 has improved up to 10.5.    [id number]      .

## 2014-08-30 ENCOUNTER — Encounter: Payer: Self-pay | Admitting: Internal Medicine

## 2014-08-30 ENCOUNTER — Non-Acute Institutional Stay (SKILLED_NURSING_FACILITY): Payer: Medicare Other | Admitting: Internal Medicine

## 2014-08-30 DIAGNOSIS — S42201D Unspecified fracture of upper end of right humerus, subsequent encounter for fracture with routine healing: Secondary | ICD-10-CM | POA: Diagnosis not present

## 2014-08-31 ENCOUNTER — Encounter (HOSPITAL_COMMUNITY)
Admission: RE | Admit: 2014-08-31 | Discharge: 2014-08-31 | Disposition: A | Discharge status: Home / Self Care | Payer: Medicare Other | Source: Ambulatory Visit | Attending: Internal Medicine | Admitting: Internal Medicine

## 2014-08-31 LAB — URINALYSIS, ROUTINE W REFLEX MICROSCOPIC
Glucose, UA: NEGATIVE mg/dL
Nitrite: POSITIVE — AB
PH: 8.5 — AB (ref 5.0–8.0)
Specific Gravity, Urine: 1.015 (ref 1.005–1.030)
UROBILINOGEN UA: 1 mg/dL (ref 0.0–1.0)

## 2014-08-31 LAB — URINE MICROSCOPIC-ADD ON

## 2014-09-01 ENCOUNTER — Other Ambulatory Visit (HOSPITAL_COMMUNITY)
Admission: RE | Admit: 2014-09-01 | Discharge: 2014-09-01 | Disposition: A | Discharge status: Home / Self Care | Payer: Medicare Other | Source: Other Acute Inpatient Hospital | Attending: Internal Medicine | Admitting: Internal Medicine

## 2014-09-01 LAB — CBC WITH DIFFERENTIAL/PLATELET
Basophils Absolute: 0 10*3/uL (ref 0.0–0.1)
Basophils Relative: 0 % (ref 0–1)
EOS PCT: 2 % (ref 0–5)
Eosinophils Absolute: 0.1 10*3/uL (ref 0.0–0.7)
HCT: 35 % — ABNORMAL LOW (ref 36.0–46.0)
HEMOGLOBIN: 11.2 g/dL — AB (ref 12.0–15.0)
Lymphocytes Relative: 12 % (ref 12–46)
Lymphs Abs: 0.9 10*3/uL (ref 0.7–4.0)
MCH: 32.4 pg (ref 26.0–34.0)
MCHC: 32 g/dL (ref 30.0–36.0)
MCV: 101.2 fL — ABNORMAL HIGH (ref 78.0–100.0)
MONOS PCT: 8 % (ref 3–12)
Monocytes Absolute: 0.6 10*3/uL (ref 0.1–1.0)
NEUTROS PCT: 78 % — AB (ref 43–77)
Neutro Abs: 5.5 10*3/uL (ref 1.7–7.7)
PLATELETS: 185 10*3/uL (ref 150–400)
RBC: 3.46 MIL/uL — ABNORMAL LOW (ref 3.87–5.11)
RDW: 14.6 % (ref 11.5–15.5)
WBC: 7.1 10*3/uL (ref 4.0–10.5)

## 2014-09-01 LAB — BASIC METABOLIC PANEL
Anion gap: 2 — ABNORMAL LOW (ref 5–15)
BUN: 26 mg/dL — ABNORMAL HIGH (ref 6–23)
CALCIUM: 8.8 mg/dL (ref 8.4–10.5)
CO2: 30 mmol/L (ref 19–32)
Chloride: 105 mmol/L (ref 96–112)
Creatinine, Ser: 0.69 mg/dL (ref 0.50–1.10)
GFR calc Af Amer: 81 mL/min — ABNORMAL LOW (ref >90)
GFR calc non Af Amer: 70 mL/min — ABNORMAL LOW (ref >90)
Glucose, Bld: 135 mg/dL — ABNORMAL HIGH (ref 70–99)
POTASSIUM: 4.5 mmol/L (ref 3.5–5.1)
Sodium: 137 mmol/L (ref 135–145)

## 2014-09-01 NOTE — Progress Notes (Signed)
Patient ID: Briana Schultz, female   DOB: 18-Aug-1912, 81 y.o.   MRN: [id number]   his is an acute visit.  Level care skilled.  Facility [facility].  CHIEF COMPLAINT:  Acute visit secondary to pain management with history of right humeral fracture-.                   HISTORY OF PRESENT ILLNESS:    This is a patient who lives at home, cared for by her daughter.  She was a patient here, I believe, in the summer with a right hip fracture.    Most recently, she got up to go to the bathroom, fell, and suffered a comminuted fracture of the right humerus.  This is managed conservatively.  She was seen by Dr. Romeo Apple.      She was discharged to a nursing home, lasted less than a day, and then she was readmitted to hospital with hypoxemia.  It was felt that this was secondary to atelectasis and/or possibly early pneumonia.  Her white count was 18 on admission, which quickly normalized.  A CT scan of the chest was negative for PE, showed a small pleural effusion and atelectasis.     Respiratory wise she appears to be stable  -she is having some pain with her right humeral fracture she does not do well with narcotics she is currently on Tylenol thousand milligrams twice a day with orders for additional dose when necessary-she is also on Mobic 7.5 mg twice a day    .    PAST MEDICAL HISTORY/PROBLEM LIST:                         Hypertension.    Aortic valve stenosis.    Syncope and collapse.    Osteoarthritis.    Gastroesophageal reflux disease.    Osteoporosis.    History of scarlet fever.    Significant cervical and thoracic kyphosis.    TIA.      Right femur fracture.    Atrial fibrillation.  Not on anticoagulation due to fall risk.       Diastolic heart failure.    Proximal right humerus fracture.    On home O2 at 2 L.    Thrombocytopenia.     Malnutrition.    Frequent falls.    Mild dementia.    DNR status.    PAST SURGICAL HISTORY:              Cholecystectomy.    Cataract extraction.    Right femur fracture in June 2015.    CURRENT MEDICATIONS:  Discharge medications include:        Xanax 0.5 q.h.s. p.r.n., which she has apparently been on long-term.    Tylenol thousand milligrams twice a day an additional dose when necessary.    Norvasc 5 q.d.       Calcium carbonate 1200 daily.    Coreg 3.125 b.i.d.      Colace 100 daily.     Nexium 40 q.d.      Glucerna three times daily.      Ferrous sulfate 325 b.i.d.      Claritin 10 q.d.       Vitamin D 50,000 U every seven days.     Mobic 7.5 b.i.d.       Phenergan 12.5 every 4 hours p.r.n.       ALLERGIES/INTOLERANCES:   Allergies noted to virtually all forms of narcotics, which either  cause nausea or disorientation.  Even tramadol apparently made her disorientated during her stay in June.    She also has noted allergies to:    Amoxicillin.     Doxycycline.    Septra.    Fosamax.    SOCIAL HISTORY:                         HOUSING:  The patient lives with her daughter, who is her primary caregiver.   FUNCTIONAL STATUS:  She needs assistance with basic ADLs including dressing and toileting, although she is continent.  She uses a walker at home.    FAMILY HISTORY:      FATHER:  Heart failure in her father, as well as diabetes.    REVIEW OF SYSTEMS:  In general no complaints of fever or chills.  Eyes does not complain of visual changes           CHEST/RESPIRATORY:  She is not complaining of shortness of breath.   CARDIAC:   No chest pain.    GI:  No nausea, vomiting or diarrhea.    GU:  No dysuria.    MUSCULOSKELETAL: Is complaining of right arm pain especially after therapy    PHYSICAL EXAMINATION:              Temperature 98.0 pulse 90 respirations 21 blood pressure 112/68  .   GENERAL APPEARANCE:  Frail, but alert woman in no distress.    CHEST/RESPIRATORY:  Severe thoracic kyphosis with very shallow air entry at the bases.   CARDIOVASCULAR:   CARDIAC:  Heart sounds are actually regular.  There are no murmurs.  She appears to be euvolemic.  She does not have elevation of her jugular venous pressure---.   GASTROINTESTINAL:  ABDOMEN:   Soft.  No masses.-Nontender      t.   BILATERAL UPPER EXTREMITIES:  The right arm is in a a sling  --moves her extremities otherwise at baseline has significant lower extremity weakness  Radial pulse is intact on the right as well as capillary refill-I do not note any changes in her right shoulder area--no edema or increased erythema.  Neurologic appears grossly intact her speech is clear.  Psych she is bright and alert largely appropriate listed as having dementia which has--- this is quite mild  Labs.  2- 22nd 2016.  Sodium 143 potassium 3.8 BUN 17 creatinine 0.55 CO2 was 33.  Liver function tests within normal limits except alkaline phosphatase minimally elevated at 139 albumin 3.2.  WBC 7.1 hemoglobin 10.5 platelets 235      ASSESSMENT/PLAN:                      Recent right humeral fracture.  .  Manage conservatively.  Now with a sling.  Followed by Dr. Romeo AppleHarrison.--Routine Tylenol apparently has helped but she still has some significant pain-options are limited with her not doing well with narcotics or even tramadol-this was discussed with patient and her daughter at bedside-will try topical Biofreeze when necessary to see if this will give some relief and monitor    Admission with hypoxemia, elevated white count.  A CT scan did not show a PE or pneumonia.  The white count stabilized.  I suspect that her hypoxemia is related to predominantly extrathoracic cage compression with concomitant atelectasis.  Her elevated CO2s on her chemistry panels might reflect compensation for chronic respiratory acidosi-- appears stable CO2 has improved to 33 most recent  lab-update is pending    .    [id number]      .

## 2014-09-02 ENCOUNTER — Non-Acute Institutional Stay (SKILLED_NURSING_FACILITY): Payer: Medicare Other | Admitting: Internal Medicine

## 2014-09-02 DIAGNOSIS — R310 Gross hematuria: Secondary | ICD-10-CM | POA: Diagnosis not present

## 2014-09-02 LAB — URINE CULTURE: Colony Count: >=100000

## 2014-09-03 ENCOUNTER — Other Ambulatory Visit (HOSPITAL_COMMUNITY)
Admission: RE | Admit: 2014-09-03 | Discharge: 2014-09-03 | Disposition: A | Discharge status: Home / Self Care | Payer: Medicare Other | Source: Skilled Nursing Facility | Attending: Internal Medicine | Admitting: Internal Medicine

## 2014-09-03 ENCOUNTER — Non-Acute Institutional Stay (SKILLED_NURSING_FACILITY): Payer: Medicare Other | Admitting: Internal Medicine

## 2014-09-03 DIAGNOSIS — R319 Hematuria, unspecified: Secondary | ICD-10-CM

## 2014-09-03 DIAGNOSIS — S42301D Unspecified fracture of shaft of humerus, right arm, subsequent encounter for fracture with routine healing: Secondary | ICD-10-CM | POA: Diagnosis not present

## 2014-09-03 DIAGNOSIS — L03111 Cellulitis of right axilla: Secondary | ICD-10-CM | POA: Diagnosis not present

## 2014-09-03 DIAGNOSIS — L0390 Cellulitis, unspecified: Secondary | ICD-10-CM | POA: Insufficient documentation

## 2014-09-03 NOTE — Progress Notes (Signed)
Patient ID: Briana Schultz, female   DOB: [birth year], 81 y.o.   MRN: [id number]    an acute visit.  Level care skilled.  Facility [facility].  CHIEF COMPLAINT:  Acute visit secondary to well under her right axilla area-follow-up UTI-.                   HISTORY OF PRESENT ILLNESS:    This is a patient who lived at home, cared for by her daughter.  She was a patient here, I believe, in the summer with a right hip fracture.    Most recently, she got up to go to the bathroom, fell, and suffered a comminuted fracture of the right humerus.  This is managed conservatively.  She was seen by Dr. Romeo Apple.      She was discharged to a nursing home, lasted less than a day, and then she was readmitted to hospital with hypoxemia.  It was felt that this was secondary to atelectasis and/or possibly early pneumonia.  Her white count was 18 on admission, which quickly normalized.  A CT scan of the chest was negative for PE, showed a small pleural effusion and atelectasis.     Respiratory wise she appears to be stable   Most acute issue has been pain of her right humeral fracture-she does not do well with strong pain medicine she is on Tylenol thousand milligrams twice a day we also recently added Biofreeze topically when necessary apparently she does not take this very often-is would have to be encouraged.  Most acute issue recently was some bloods discovered in her urine over the week-urinalysis and culture was done this has returned showing essentially diphtheroids.--She previously is been put on Cipro empirically which she continues on  She is afebrile.  She was seen by Dr. Leanord Hawking yesterday with ordered guaiac stools 3-as well as an update a CBC tomorrow-her hemoglobin appears to be stable on lab done on February 28 at 11.2.  Today nursing staff did noted a boil cystlike area under her right axilla apparently it is draining erythematous and tender to touch    .    PAST MEDICAL HISTORY/PROBLEM  LIST:                         Hypertension.    Aortic valve stenosis.    Syncope and collapse.    Osteoarthritis.    Gastroesophageal reflux disease.    Osteoporosis.    History of scarlet fever.    Significant cervical and thoracic kyphosis.    TIA.      Right femur fracture.    Atrial fibrillation.  Not on anticoagulation due to fall risk.       Diastolic heart failure.    Proximal right humerus fracture.    On home O2 at 2 L.    Thrombocytopenia.     Malnutrition.    Frequent falls.    Mild dementia.    DNR status.    PAST SURGICAL HISTORY:             Cholecystectomy.    Cataract extraction.    Right femur fracture in June 2015.    CURRENT MEDICATIONS:  Discharge medications include:        Xanax 0.5 q.h.s. p.r.n., which she has apparently been on long-term.    Tylenol thousand milligrams twice a day an additional dose when necessary.    Norvasc 5 q.d.       Calcium  carbonate 1200 daily.    Coreg 3.125 b.i.d.      Colace 100 daily.     Nexium 40 q.d.      Glucerna three times daily.      Ferrous sulfate 325 b.i.d.      Claritin 10 q.d.       Vitamin D 50,000 U every seven days.     Mobic 7.5 b.i.d.       Phenergan 12.5 every 4 hours p.r.n.       ALLERGIES/INTOLERANCES:   Allergies noted to virtually all forms of narcotics, which either cause nausea or disorientation.  Even tramadol apparently made her disorientated during her stay in June.    She also has noted allergies to:    Amoxicillin.     Doxycycline.    Septra.    Fosamax.    SOCIAL HISTORY:                         HOUSING:  The patient lives with her daughter, who is her primary caregiver.   FUNCTIONAL STATUS:  She needs assistance with basic ADLs including dressing and toileting, although she is continent.  She uses a walker at home.    FAMILY HISTORY:      FATHER:  Heart failure in her father, as well as diabetes.    REVIEW OF SYSTEMS:  In general no  complaints of fever or chills. Skin-appears to have developed a welt-type lesion under her right axilla as noted above Eyes does not complain of visual changes           CHEST/RESPIRATORY:  She is not complaining of shortness of breath.   CARDIAC:   No chest pain.    GI:  No nausea, vomiting or diarrhea.    GU:  No dysuria.    MUSCULOSKELETAL: Continues to complain at times of some right arm pain again Biofreeze will have to be encouraged   PHYSICAL EXAMINATION:              Temperature 97.3 pulse 96 respirations 18 blood pressure 165/59-113/74-I suspect systolic elevations are cause more by anxiety since this is not persistent .   GENERAL APPEARANCE:  Frail, but alert woman in no distress Her skin is warm and dry-under her right axilla there is approximately a 1 cm in diameter raised boil cystlike lesion it is erythematous and draining a small amount of cream-colored exudates it is tender to palpation there is not really significant surrounding erythema.    CHEST/RESPIRATORY:  Severe thoracic kyphosis with very shallow air entry at the bases.   CARDIOVASCULAR:  CARDIAC:  Heart sounds are actually regular.  There are no murmurs.  She appears to be euvolemic.  She does not have elevation of her jugular venous pressure---.   GASTROINTESTINAL:  ABDOMEN:   Soft.  No masses.-Nontender      t.   BILATERAL UPPER EXTREMITIES:  The right arm is in a a sling  --moves her extremities otherwise at baseline has significant lower extremity weakness  Radial pulse is intact on the right as well as capillary refill-.  Neurologic appears grossly intact her speech is clear.  Psych she is bright and alert largely appropriate listed as having dementia which has--- this is quite mild  Labs  09/01/2014.  WBC 7.1 hemoglobin 11.2 platelets 185.  Sodium 137 potassium 4.5 BUN 26 creatinine 0.69.  2- 22nd 2016.  Sodium 143 potassium 3.8 BUN 17 creatinine 0.55 CO2 was 33.  Liver function tests within  normal limits except alkaline phosphatase minimally elevated at 139 albumin 3.2.  WBC 7.1 hemoglobin 10.5 platelets 235      ASSESSMENT/PLAN:    #1-cellulitis abscess under right axilla  New problem-this is significantly tender and draining we have obtained a culture-will start doxycycline empirically 100 mg twice a day for 10 days and monitor-also will add a probiotic.--She does have a listed allergy to numerous antibiotics including doxycycline-however per discussion with her daughter who is very familiar with her mother cannot recall any significant anaphylactic l or what appears to be true allergic reaction-she has at times with antibiotics had apparently diarrhea and abdominal discomfort  And nausea which I suspect has led to the extensive list of allergies-will start her on doxycycline and monitor for any allergic reaction again we'll start her on a probiotic as well  #2 question UTI-cultures returned growing only diphtheroids will discontinue the Cipro again she is on doxycycline for the skin issue   Or 3                   Recent right humeral fracture.  .  Manage conservatively.  Now with a sling.  Followed by Dr. Romeo Apple.--Routine Tylenol apparently has helped--we have added Biofreeze when necessary-apparently does not take this often have encouraged her to let staff know when it is hurting so they can apply this    #4-hematuria-apparently this is intermittent per nursing staff they have not noted it day-again updated lab work has been ordered. Hemoglobin per chart review appears to be stable to actually slightly improved.    [id number].                Marland Kitchen        Marland Kitchen

## 2014-09-04 ENCOUNTER — Non-Acute Institutional Stay (SKILLED_NURSING_FACILITY): Payer: Medicare Other | Admitting: Internal Medicine

## 2014-09-04 ENCOUNTER — Encounter: Payer: Self-pay | Admitting: Internal Medicine

## 2014-09-04 ENCOUNTER — Encounter (HOSPITAL_COMMUNITY)
Admission: RE | Admit: 2014-09-04 | Discharge: 2014-09-04 | Disposition: A | Discharge status: Home / Self Care | Payer: Medicare Other | Source: Skilled Nursing Facility | Attending: Internal Medicine | Admitting: Internal Medicine

## 2014-09-04 DIAGNOSIS — L03111 Cellulitis of right axilla: Secondary | ICD-10-CM | POA: Insufficient documentation

## 2014-09-04 DIAGNOSIS — R319 Hematuria, unspecified: Secondary | ICD-10-CM

## 2014-09-04 DIAGNOSIS — I10 Essential (primary) hypertension: Secondary | ICD-10-CM | POA: Diagnosis not present

## 2014-09-04 LAB — CBC WITH DIFFERENTIAL/PLATELET
BASOS ABS: 0 10*3/uL (ref 0.0–0.1)
Basophils Relative: 1 % (ref 0–1)
Eosinophils Absolute: 0.1 10*3/uL (ref 0.0–0.7)
Eosinophils Relative: 3 % (ref 0–5)
HCT: 35 % — ABNORMAL LOW (ref 36.0–46.0)
HEMOGLOBIN: 11.2 g/dL — AB (ref 12.0–15.0)
LYMPHS ABS: 0.9 10*3/uL (ref 0.7–4.0)
LYMPHS PCT: 19 % (ref 12–46)
MCH: 32.5 pg (ref 26.0–34.0)
MCHC: 32 g/dL (ref 30.0–36.0)
MCV: 101.4 fL — ABNORMAL HIGH (ref 78.0–100.0)
MONO ABS: 0.5 10*3/uL (ref 0.1–1.0)
Monocytes Relative: 9 % (ref 3–12)
NEUTROS ABS: 3.5 10*3/uL (ref 1.7–7.7)
Neutrophils Relative %: 68 % (ref 43–77)
Platelets: 166 10*3/uL (ref 150–400)
RBC: 3.45 MIL/uL — ABNORMAL LOW (ref 3.87–5.11)
RDW: 14.4 % (ref 11.5–15.5)
WBC: 5 10*3/uL (ref 4.0–10.5)

## 2014-09-04 LAB — OCCULT BLOOD X 1 CARD TO LAB, STOOL: Fecal Occult Bld: NEGATIVE

## 2014-09-04 NOTE — Progress Notes (Addendum)
Patient ID: Briana Schultz, female   DOB: 07/26/1912, 81 y.o.   MRN: [id number]                PROGRESS NOTE  DATE:  09/02/2014                   FACILITY: [facility]           LEVEL OF CARE:   SNF   Acute Visit                                      CHIEF COMPLAINT:  Blood in the toilet.       HISTORY OF PRESENT ILLNESS:  [name] is a 81 year-old lady who is here after developing a right comminuted humerus fracture.  This was managed conservatively.  She could not manage on her own and she has been admitted here.    Yesterday, apparently was noted to have a blood clot, either vaginal or from her urethra.  A urinalysis has shown large hemoglobin and she was started on antibiotics.  Her daughter informs me that last summer during her recovery from hip surgery, there was possibly something in her bladder.  This will give me a start at seeing if we know something about this issue already.    LABORATORY DATA:  Lab work from today shows a hemoglobin of 11.2.  This is stable.  Her white count is 7.1.    Basic metabolic panel shows a BUN of 26 and a creatinine of 0.69.    PHYSICAL EXAMINATION:   GASTROINTESTINAL:   ABDOMEN:  No masses.   No tenderness.   LIVER/SPLEEN/KIDNEYS:  No liver, no spleen.   GENITOURINARY:   BLADDER:  No suprapubic and no costovertebral angle tenderness.   EXTERNAL & VAGINAL:  Vaginally, her os looks normal.  There is no gross bleeding.   RECTAL:  Exam showed a scant amount of guaiac-negative stool.    ASSESSMENT/PLAN:                                      Hematuria-gross I suspect this is the issue here.  She does not complain of dysuria.  Therefore, I am not completely certain about whether she has the UTI or not.  As her daughter reports, there may be something known in [facility] about this and I will see if I can find this for my next visit on Wednesday ie perhaps a CT scan showed a bladder issue per her description. Otherwise this is an  issue which would probably require at least renal/pelvic ultrasnound, urology consult. As mentioned I see no evidence of rectal or vaginal bleeding

## 2014-09-04 NOTE — Progress Notes (Signed)
Patient ID: Briana Schultz, female   DOB: 1913-04-03, 81 y.o.   MRN: [id number]   This is an acute visit.  Level care skilled.  Facility [facility].  Chief complaint-acute visit follow-up question hematuria.--Hypertension-follow-up right axilla lesion  History of present illness.  Patient is a pleasant 81 year old female who we have seen frequently recently for multiple issues including a right humeral fracture pain management also possible GI-vaginal bleeding--as well as a right axillary abscess.  We have done occult blood testing which so far has been negative.  The urine culture only grew out diphtheroids area  She had been empirically on ciprofloxacin  For a  UTIthis was switched to doxycycline yesterday secondary to the abscess  Per nursing and family the abscess appears to be improved today.  She is complaining of less pain.  .  She does have a listed doxycycline allergy but she appears to be tolerating this well according to her daughter who is very attentive her antibiotic allergies which are numerous are largely related to GI side effects-no anaphylactic reaction noted.  Patient was found by staff today to have a small amount of dark residue in her diaper-when I assessed it found it to be very scant dried-difficult to fully ascertain although this  not appeared to be blood-it was fairly light-colored.  Daughter reports that last year at some point in the hospital they did find a "spot" on her bladder-per review of Epic I cannot really see this finding although her daughter is quite an accurate historian area  She does state her primary care provider on discharge from the hospital was aware this and suggested non-aggressive follow-up since she would be a poor candidate for any aggressive intervention-I did discuss this fairly extensively with her daughter today and expressed agreement with that opinion she is quite comfortable with this stating she does not want to put her  mother through invasive procedures  We have been monitoring her hemoglobin and this has been stable at 11.2 today-I assured her daughter that if she is losing any blood it is a very slow blood loss since her hemoglobin actually has been very stable  Also when I assessed patient today following her systolic blood pressure to be about 200 should just use the restroom-somewhat anxious and had received her anti-anxiety medication a bit prior to this.  I did recheck it later in it had gone closer to her baseline at 150/60 she has relatively variable systolics actually it was 109/59 this morning-  She is on Coreg 3.25 mg a day as well as Norvasc 5 mg a day   Past family medical history.  Reviewed per progress note 08/27/2014.  Medications have been reviewed per [facility]  Review of systems.  In general does not complaining of fever chills.  Skin right axilla lesion appears to be improved appears to be less painful.  Head ears eyes nose mouth and throat-does not complain of sore throat or visual changes.  Respiratory no shortness breath or cough.  Cardiac no chest pain.  GI does not complain of abdominal discomfort today nausea or vomiting.  Musculoskeletal continues to complain of some right upper arm pain status post humerus fracture at this appears to be somewhat better controlled than when I have seen her previously however.  Neurologic is not complaining of dizziness or headache.  Psych does have a significant history of anxiety as stated above.  Physical exam.  Temperature 97.1 pulse 90 respirations 20 blood pressure again this was somewhat involved  today initially I got 200/90-this was again after she had use the restroom later I did recheck it in the head come down to 150/60.  Again per review it appears her blood pressures are quite variable I was actually 109/59 this morning her baseline appears to be roughly in the 140 area.  In general this a frail elderly female initially  appear to be anxious but on reevaluation later appeared to be called or.  Her skin is warm and dry.--Right axilla lesion actually appears somewhat improved today it appears somewhat drier-- no surrounding erythema is less tender to palpation there is still some firmness here and erythema but again this appears to be an improvement from yesterday  Eyes pupils appear reactive to light visual acuity appears intac  t oropharynx is clear mucous membranes moist  Chest is clear to auscultation.  Heart is regular rate and rhythm she does not have significant lower extremity edema.  Abdomen soft nontender with positive bowel sounds.  Musculoskeletal she does have her right arm in a sling radial pulses intact We will refill intact this appears to be baseline.  Neurologic no lateralizing findings her speech is clear.  Psych she is grossly alert and oriented is somewhat anxious again this was better when I rechecked her.  Labs.  09/04/2014.  WBC 5.0 hemoglobin 11.2 platelets 166.  09/01/2014.  Hemoglobin was 11.2 as well.  Sodium 137 potassium 4.5 BUN 26 creatinine 0.69.  Assessment and plan.  #1-hypertension-on initial evaluation this appear to be quite significant however this may have been due to anxiety since he did come down fairly quickly on reevaluation after approximately 15 or 20 minutes at this point will monitor she has right able systolics I suspect anxiety controlled recent this at this point will continue current medications including Coreg and Norvasc.  #2-right axilla lesion-this appears to be improved-she is on doxycycline as noted she does not show allergy symptoms without rash or GI discomfort at this time this willl to be monitored  #3 history of question hematuria-this was discussed extensively with her daughter today her blood work appears to be stable with a stable hemoglobin-I also discussed this with Dr. Leanord Hawkingobson there is some suspicion this could be coming from her  bladder with a lesion in the bladder-however as stated above she is a poor candidate for aggressive intervention her daughter is comfortable with just monitoring her labs has expressed desires for no aggressive intervention or studies which I feel is --will check a CBC next week again HBG has been stable  [id number]-of note greater than 40 minutes spent assessing patient-re- assessing patient-extensive discussion with her daughter about patient's numerous issues-and coordinating and formulating a plan of care for numerous diagnoses-of note greater than 50% of time spent coordinating plan of care with family input   .     --.

## 2014-09-06 ENCOUNTER — Encounter: Payer: Self-pay | Admitting: Internal Medicine

## 2014-09-06 ENCOUNTER — Non-Acute Institutional Stay (SKILLED_NURSING_FACILITY): Payer: Medicare Other | Admitting: Internal Medicine

## 2014-09-06 ENCOUNTER — Encounter (HOSPITAL_COMMUNITY)
Admission: RE | Admit: 2014-09-06 | Discharge: 2014-09-06 | Disposition: A | Discharge status: Home / Self Care | Payer: Medicare Other | Source: Skilled Nursing Facility | Attending: Internal Medicine | Admitting: Internal Medicine

## 2014-09-06 DIAGNOSIS — I5030 Unspecified diastolic (congestive) heart failure: Secondary | ICD-10-CM

## 2014-09-06 DIAGNOSIS — L03111 Cellulitis of right axilla: Secondary | ICD-10-CM

## 2014-09-06 DIAGNOSIS — I10 Essential (primary) hypertension: Secondary | ICD-10-CM | POA: Diagnosis not present

## 2014-09-06 DIAGNOSIS — R319 Hematuria, unspecified: Secondary | ICD-10-CM | POA: Diagnosis not present

## 2014-09-06 DIAGNOSIS — S42201D Unspecified fracture of upper end of right humerus, subsequent encounter for fracture with routine healing: Secondary | ICD-10-CM

## 2014-09-06 LAB — CULTURE, ROUTINE-ABSCESS

## 2014-09-06 LAB — OCCULT BLOOD X 1 CARD TO LAB, STOOL: Fecal Occult Bld: NEGATIVE

## 2014-09-06 NOTE — Progress Notes (Signed)
Patient ID: Briana Schultz, female   DOB: 12-27-1912, 81 y.o.   MRN: [id number]   This is a discharge note  Level care skilled.  Facility [facility].  CHIEF COMPLAINT:  Discharge note  HISTORY OF PRESENT ILLNESS:    This is a patient who lived at home, cared for by her daughter.  She was a patient here, I believe, in the summer with a right hip fracture.    Most recently, she got up to go to the bathroom, fell, and suffered a comminuted fracture of the right humerus.  This is managed conservatively.  She was seen by Dr. Romeo Apple.      She was discharged to a nursing home, lasted less than a day, and then she was readmitted to hospital with hypoxemia.  It was felt that this was secondary to atelectasis and/or possibly early pneumonia.  Her white count was 18 on admission, which quickly normalized.  A CT scan of the chest was negative for PE, showed a small pleural effusion and atelectasis.     Respiratory wise she appears to be stable   M-she does not do well with strong pain medicine she is on Tylenol thousand milligrams twice a day we also recently added Biofreeze topically when necessary --she has follow-up scheduled with orthopedics--of note she also continues on Mobic  Most acute issue recently was some bloods discovered in her urine over the past week or so-urinalysis and culture was done this has returned showing essentially diphtheroids.--She empirically have been put on Cipro however this was switched to doxycycline earlier this week secondary to a small abscess she developed under her right axilla.  This appears to be significantly improved today drier less tender appears to be resolving fairly unremarkably.  Regards to her urinary issues with intermitted hematuria-I had a fairly extensive discussion with her daughter earlier this week and she does not really want aggressive workup of this secondary to her mother's advanced age and comorbidities which I feel is certainly  reasonable.--Apparently a spot was found on her bladder during her recent hospitalization although I cannot find confirmation of this-nonetheless her daughter does not really want aggressive urologic follow-up or invasive procedures  We have been monitoring her hemoglobin which appears to be stable we do have a recheck scheduled for early next week --home health will have to draw this since he is being discharged today.  She will be going home with her daughter who is very supportive        .    PAST MEDICAL HISTORY/PROBLEM LIST:                         Hypertension.    Aortic valve stenosis.    Syncope and collapse.    Osteoarthritis.    Gastroesophageal reflux disease.    Osteoporosis.    History of scarlet fever.    Significant cervical and thoracic kyphosis.    TIA.      Right femur fracture.    Atrial fibrillation.  Not on anticoagulation due to fall risk.       Diastolic heart failure.    Proximal right humerus fracture.    On home O2 at 2 L.    Thrombocytopenia.     Malnutrition.    Frequent falls.    Mild dementia.    DNR status.    PAST SURGICAL HISTORY:             Cholecystectomy.  Cataract extraction.    Right femur fracture in June 2015.    CURRENT MEDICATIONS:  Discharge medications include:        Xanax 0.5 q.h.s. p.r.n., which she has apparently been on long-term.    Tylenol thousand milligrams twice a day an additional dose when necessary.    Norvasc 510q.d.       Calcium carbonate 1200 daily.    Coreg 3.125 b.i.d.      Colace 100 daily.     Nexium 40 q.d.      Glucerna three times daily.      Ferrous sulfate 325 b.i.d.      Claritin 10 q.d.       Vitamin D 50,000 U every seven days.     Mobic 7.5 b.i.d.       Phenergan 12.5 every 4 hours p.r.n.  Doxycycline 100 mg twice a day which will end after a seven-day course early next week will extend this secondary to continued presence of the abscess although this  appears again to be significantly improved       ALLERGIES/INTOLERANCES:   Allergies noted to virtually all forms of narcotics, which either cause nausea or disorientation.  Even tramadol apparently made her disorientated during her stay in June.    She also has noted allergies to:    Amoxicillin.     Doxycycline.    Septra.    Fosamax.    Apparently mostly  allergies appear to be more intolerances with GI side effects nausea vomiting abdominal discomfort    SOCIAL HISTORY:                         HOUSING:  The patient lives with her daughter, who is her primary caregiver.   FUNCTIONAL STATUS:  She needs assistance with basic ADLs including dressing and toileting, although she is continent.  She uses a walker at home.    FAMILY HISTORY:      FATHER:  Heart failure in her father, as well as diabetes.    REVIEW OF SYSTEMS:  In general no complaints of fever or chills. Skin- No rashes or itching small abscess under right axilla appears to be resolving          CHEST/RESPIRATORY:  She is not complaining of shortness of breath.   CARDIAC:   No chest pain.    GI:  No nausea, vomiting or diarrhea today-questionable episode of vomiting last night although nursing reports this was more of a spitting up episode.    GU:  No dysuria.    MUSCULOSKELETAL: Continues to complain at times of some right arm pain again Biofreeze will have to be encouraged   Neurologic does not complain of headache or dizziness or numbness  PHYSICAL EXAMINATION:              Temperature 98.5 pulse 84 respirations 22 blood pressure 119/79 .   GENERAL APPEARANCE:  Frail, but alert woman in no distress Her skin is warm and dry-under her right axilla there is approximately a 1 cm in diameter raised boil--this is drier less tender less erythematous than when I saw her earlier this week there is no surrounding erythema     CHEST/RESPIRATORY:  Severe thoracic kyphosis with very shallow air entry at the  bases--labored breathing.   CARDIOVASCULAR:  CARDIAC:  Heart sounds are actually regular.  There are no murmurs.  She appears to be euvolemic.  She does not have elevation of her  jugular venous pressure---.   GASTROINTESTINAL:  ABDOMEN:   Soft.  No masses.-Nontender      t.   BILATERAL UPPER EXTREMITIES:  The right arm is in a a sling  --moves her extremities otherwise at baseline has significant lower extremity weakness  Radial pulse is intact on the right as well as capillary refill-.  Neurologic appears grossly intact her speech is clear.  Psych she is bright and alert largely appropriate listed as having dementia which has--- this is quite mild  Labs    09/01/2014.  WBC 7.1 hemoglobin 11.2 platelets 185.  Sodium 137 potassium 4.5 BUN 26 creatinine 0.69.  2- 22nd 2016.  Sodium 143 potassium 3.8 BUN 17 creatinine 0.55 CO2 was 33.  Liver function tests within normal limits except alkaline phosphatase minimally elevated at 139 albumin 3.2.  WBC 7.1 hemoglobin 10.5 platelets 235      ASSESSMENT/PLAN:    #1-cellulitis abscess under right axilla--this appears to be improving will extend her doxycycline for 3 more days since it is still there although again improved ll  #2 history of intermittent hematuria with suspicion of a bladder lesion-as stated above per daughter does not want aggressive workup of this we are monitoring her blood work --Home health will need to draw CBC early next week to make sure hemoglobin is stable which it has been during her stay here   3                   Recent right humeral fracture.  .  Manage conservatively.  Now with a sling.  Followed by Dr. Romeo AppleHarrison.--Routine Tylenol apparently has helped--we have added Biofreeze when necessary-- also is receiving Mobic    4--Diastolic CHF--this has been stable--she is on a beta blocker.                                                                                                                                                                         #5-history of A. fib-this appears to be rate controlled again she is on Coreg.    #6-Hypertension she is on Norvasc 10 mg a day she has somewhat variable systolics as subject's this often could be anxiety related.    #7History of anxiety she continues on Xanax this appears relatively well controlled although she does have anxious moments at times.  #8  History of anemia this has been stable she is on iron-   .    [id number]--[id number]--of note greater than 30 minutes spent on this discharge summary.                Marland Kitchen.        .Marland Kitchen

## 2014-09-07 DIAGNOSIS — S42301D Unspecified fracture of shaft of humerus, right arm, subsequent encounter for fracture with routine healing: Secondary | ICD-10-CM | POA: Diagnosis not present

## 2014-09-07 DIAGNOSIS — S80921D Unspecified superficial injury of right lower leg, subsequent encounter: Secondary | ICD-10-CM

## 2014-09-07 DIAGNOSIS — J189 Pneumonia, unspecified organism: Secondary | ICD-10-CM | POA: Diagnosis not present

## 2014-09-07 DIAGNOSIS — R509 Fever, unspecified: Secondary | ICD-10-CM

## 2014-09-10 ENCOUNTER — Ambulatory Visit (INDEPENDENT_AMBULATORY_CARE_PROVIDER_SITE_OTHER): Payer: Medicare Other

## 2014-09-10 ENCOUNTER — Ambulatory Visit (INDEPENDENT_AMBULATORY_CARE_PROVIDER_SITE_OTHER): Payer: Self-pay | Admitting: Orthopedic Surgery

## 2014-09-10 ENCOUNTER — Encounter: Payer: Self-pay | Admitting: Orthopedic Surgery

## 2014-09-10 VITALS — Resp 18 | Ht 60.0 in | Wt 91.6 lb

## 2014-09-10 DIAGNOSIS — S42301S Unspecified fracture of shaft of humerus, right arm, sequela: Secondary | ICD-10-CM

## 2014-09-10 DIAGNOSIS — S42201S Unspecified fracture of upper end of right humerus, sequela: Secondary | ICD-10-CM

## 2014-09-10 NOTE — Progress Notes (Signed)
Consult follow-up  Patient has a proximal humerus fracture on the right back in February  Chief Complaint  Patient presents with  . Follow-up    3 week follow up Rt humerus fracture, DOI 08/09/14    She was treated with sling immobilizer did well. X-rays today show varus collapse of the fracture but nothing that would make us change her treatment in this 81 year old female  Recommend physical therapy pass range of motion from follow-up with me in 2 months  Today's x-rays show proximal humerus fracture with varus collapse lateral view shows no displacement

## 2014-09-11 NOTE — Addendum Note (Signed)
Addended by: Adella HareBOOTHE, Verneda Hollopeter B on: 09/11/2014 02:20 PM   Modules accepted: Orders

## 2014-09-13 ENCOUNTER — Telehealth: Payer: Self-pay | Admitting: Orthopedic Surgery

## 2014-09-13 ENCOUNTER — Inpatient Hospital Stay
Admission: RE | Admit: 2014-09-13 | Discharge: 2014-10-01 | Disposition: A | Discharge status: Home / Self Care | Payer: Medicare Other | Source: Ambulatory Visit | Attending: Internal Medicine | Admitting: Internal Medicine

## 2014-09-13 DIAGNOSIS — R079 Chest pain, unspecified: Principal | ICD-10-CM

## 2014-09-13 DIAGNOSIS — M79672 Pain in left foot: Secondary | ICD-10-CM

## 2014-09-13 NOTE — Telephone Encounter (Signed)
Either is fine

## 2014-09-13 NOTE — Telephone Encounter (Signed)
Verlon AuLeslie with [facility] is calling asking if it is ok for Briana Schultz to be using her Cane vs her [name], Please Advise?

## 2014-09-16 ENCOUNTER — Inpatient Hospital Stay (HOSPITAL_COMMUNITY)
Admit: 2014-09-16 | Discharge: 2014-09-16 | Disposition: A | Discharge status: Home / Self Care | Payer: Medicare Other | Attending: Internal Medicine | Admitting: Internal Medicine

## 2014-09-16 ENCOUNTER — Other Ambulatory Visit: Payer: Self-pay | Admitting: *Deleted

## 2014-09-16 ENCOUNTER — Non-Acute Institutional Stay (SKILLED_NURSING_FACILITY): Payer: Medicare Other | Admitting: Internal Medicine

## 2014-09-16 DIAGNOSIS — M810 Age-related osteoporosis without current pathological fracture: Secondary | ICD-10-CM

## 2014-09-16 DIAGNOSIS — R270 Ataxia, unspecified: Secondary | ICD-10-CM

## 2014-09-16 DIAGNOSIS — R319 Hematuria, unspecified: Secondary | ICD-10-CM | POA: Diagnosis not present

## 2014-09-16 MED ORDER — ALPRAZOLAM 0.5 MG PO TABS: ORAL_TABLET | ORAL | Status: DC

## 2014-09-16 NOTE — Telephone Encounter (Signed)
Note, Left Message on voicemail for [place] with [facility]

## 2014-09-16 NOTE — Telephone Encounter (Signed)
[facility] 

## 2014-09-17 ENCOUNTER — Non-Acute Institutional Stay: Payer: Medicare Other | Admitting: Internal Medicine

## 2014-09-17 ENCOUNTER — Encounter: Payer: Self-pay | Admitting: Internal Medicine

## 2014-09-17 ENCOUNTER — Telehealth: Payer: Self-pay | Admitting: Orthopedic Surgery

## 2014-09-17 ENCOUNTER — Other Ambulatory Visit: Payer: Self-pay | Admitting: *Deleted

## 2014-09-17 ENCOUNTER — Non-Acute Institutional Stay (SKILLED_NURSING_FACILITY): Payer: Medicare Other | Admitting: Internal Medicine

## 2014-09-17 DIAGNOSIS — R319 Hematuria, unspecified: Secondary | ICD-10-CM

## 2014-09-17 DIAGNOSIS — R3129 Other microscopic hematuria: Secondary | ICD-10-CM

## 2014-09-17 DIAGNOSIS — N390 Urinary tract infection, site not specified: Secondary | ICD-10-CM

## 2014-09-17 DIAGNOSIS — S42301D Unspecified fracture of shaft of humerus, right arm, subsequent encounter for fracture with routine healing: Secondary | ICD-10-CM | POA: Diagnosis not present

## 2014-09-17 DIAGNOSIS — S42201D Unspecified fracture of upper end of right humerus, subsequent encounter for fracture with routine healing: Secondary | ICD-10-CM

## 2014-09-17 NOTE — Telephone Encounter (Signed)
Patients daughter called and stated that Ms. Briana Schultz is back at the [facility] and asked if we could please send them her Physical Therapy Order, Please Advise?

## 2014-09-17 NOTE — Progress Notes (Addendum)
Patient ID: Briana AdolphHallie M Schultz, female   DOB: 09/13/1912, 81 y.o.   MRN: [id number]                 PROGRESS NOTE  DATE:  09/16/2014               FACILITY: [facility]        LEVEL OF CARE:   SNF   Acute Visit   CHIEF COMPLAINT:  Readmission to the facility.         HISTORY OF PRESENT ILLNESS:  This is a patient who came here after suffering a comminuted fracture of her right humerus.  She had previously been here in the summer after having a right hip fracture.    She is a very frail woman.  In fact, I had recently seen her for blood in the toilet which I thought was probably of bladder origin.   In any case, she was seen by our service on 09/06/2014 with plans for her to go home.  She apparently went home, only to be readmitted to the facility due to illness of her daughter.  She is apparently not going to be returning there and is here as a long-term patient.     CURRENT MEDICATIONS:  Medication list is reviewed.  Currently on:    Amlodipine 5 q.d.       Carvedilol 3.125 twice a day.    Claritin 10 q.d.              Ensure three times a day.    Ferrous sulfate 325 b.i.d.      Maalox Advanced twice a day.    Meloxicam 7.5 b.i.d.        Nexium 40 a day.    Phenergan p.r.n.      Tylenol Extra-Strength p.r.n.        Vitamin D3, 50,000 U once weekly.    PHYSICAL EXAMINATION:   GENERAL APPEARANCE:  The patient is not in any distress.    CHEST/RESPIRATORY:  Kyphotic.  Clear air entry bilaterally.     CARDIOVASCULAR:   CARDIAC:  Heart sounds are normal.  There are no murmurs.   GASTROINTESTINAL:   ABDOMEN:  Soft, without tenderness.    ASSESSMENT/PLAN:                               Frailty with severe osteoporosis, gait ataxia, multiple fractures.  She does not look any different from her short stay at home.  There have been no medication changes.    Bleeding, which  I thought was from her bladder.  This has apparently cleared up and not returned.     On Mobic b.i.d.    This is a once-a-day drug, which I will reduce.      CPT CODE: [id number]

## 2014-09-17 NOTE — Progress Notes (Deleted)
This is an acute visit  Level care skilled.  Facility [facility].  CHIEF COMPLAINT:  Acute visit possible blood clots in commode  HISTORY OF PRESENT ILLNESS:    This is a patient who lived at home, cared for by her daughter.  She was a patient here, I believe, in the summer with a right hip fracture.    Most recently, she got up to go to the bathroom, fell, and suffered a comminuted fracture of the right humerus.  This is managed conservatively.  She was seen by Dr. Romeo AppleHarrison.      She was discharged to a nursing home, lasted less than a day, and then she was readmitted to hospital with hypoxemia.  It was felt that this was secondary to atelectasis and/or possibly early pneumonia.  Her white count was 18 on admission, which quickly normalized.  A CT scan of the chest was negative for PE, showed a small pleural effusion and atelectasis.     Respiratory wise she appears to be stable  She did go home for very short while however apparently her daughter became ill and she was returned to the facility about 5 days ago.  Her stay here has been relatively unremarkable--however today her daughter did note which she thought was possibly some blood clots in the commode apparently after patient either urinate or had a bowel movement    She did have some blood discovered in her urine before her recent discharge-urinalysis and culture was done this had returned showing essentially diphtheroids.--  Dr. Leanord Hawkingobson also ordered guaicing stools and those were negative 2 before her discharge .  Regards to her urinary issues with intermitted hematuria-I had a fairly extensive discussion with her daughter before patient's recent discharge and she does not really want aggressive workup of this secondary to her mother's advanced age and comorbidities which I feel is certainly reasonable.--Apparently a spot was found on her bladder during her recent hospitalization although I cannot find  confirmation of this-nonetheless her daughter does not really want aggressive urologic follow-up or invasive procedures  We have been monitoring her hemoglobin which appears to be stable--but will recheck this tomorrow   Patient this afternoon appears at her baseline does not complain of any increased shortness of breath weakness or dyspnea-she does have baseline very frail physical status  Of note she has completed a course of doxycycline for  abscess site right axilla area      .    PAST MEDICAL HISTORY/PROBLEM LIST:                         Hypertension.    Aortic valve stenosis.    Syncope and collapse.    Osteoarthritis.    Gastroesophageal reflux disease.    Osteoporosis.    History of scarlet fever.    Significant cervical and thoracic kyphosis.    TIA.      Right femur fracture.    Atrial fibrillation.  Not on anticoagulation due to fall risk.       Diastolic heart failure.    Proximal right humerus fracture.    On home O2 at 2 L.    Thrombocytopenia.     Malnutrition.    Frequent falls.    Mild dementia.    DNR status.    PAST SURGICAL HISTORY:             Cholecystectomy.    Cataract extraction.  Right femur fracture in June 2015.    CURRENT MEDICATIONS:          Xanax 0.5 q.h.s. p.r.n., which she has apparently been on long-term.    Tylenol thousand milligrams twice a day an additional dose when necessary.    Norvasc 5 mg QD.       Calcium carbonate 1200 daily.    Coreg 3.125 b.i.d.      Colace 100 daily.     Nexium 40 q.d.      Glucerna three times daily.      Ferrous sulfate 325 b.i.d.      Claritin 10 q.d.       Vitamin D 50,000 U every seven days.     Mobic 7.5 b.i.d.       Phenergan 12.5 every 4 hours p.r.n.           ALLERGIES/INTOLERANCES:   Allergies noted to virtually all forms of narcotics, which either cause nausea or disorientation.  Even tramadol apparently made her disorientated during her stay in  June.    She also has noted allergies to:    Amoxicillin.     Doxycycline.    Septra.    Fosamax.    Apparently mostly  allergies appear to be more intolerances with GI side effects nausea vomiting abdominal discomfort    SOCIAL HISTORY:                         HOUSING:  The patient lives with her daughter, who is her primary caregiver.   FUNCTIONAL STATUS:  She needs assistance with basic ADLs including dressing and toileting, although she is continent.  She uses a walker at home.    FAMILY HISTORY:      FATHER:  Heart failure in her father, as well as diabetes.    REVIEW OF SYSTEMS:  In general no complaints of fever or chills. Skin- No rashes or itching           CHEST/RESPIRATORY:  She is not complaining of shortness of breath.   CARDIAC:   No chest pain.    GI:  No nausea, vomiting or diarrhea today-     GU:  No dysuria.    MUSCULOSKELETAL: In the past had continued to complain of some right arm discomfort but does not really complain of that this afternoon    Neurologic does not complain of headache or dizziness or numbness  PHYSICAL EXAMINATION:              Temperature 98.3 pulse 96 respirations 20 blood pressure 141/58 .   GENERAL APPEARANCE:  Frail, but alert woman in no distress Her skin is warm and dry-      CHEST/RESPIRATORY:  Severe thoracic kyphosis with very shallow air entry at the bases-  no-labored breathing.   CARDIOVASCULAR:  CARDIAC:  Heart sounds are actually regular.  There are no murmurs.  She appears to be euvolemic.  She does not have elevation of her jugular venous pressure---.   GASTROINTESTINAL:  ABDOMEN:   Soft.  No masses.-Nontender sounds are positive     t.   BILATERAL UPPER EXTREMITIES:  The right arm is in a a sling  --moves her extremities otherwise at baseline has significant lower extremity weakness  Radial pulse is intact on the right as well as capillary refill-.  Neurologic appears grossly intact her speech is  clear.  Psych she is bright and alert largely appropriate listed as having dementia  which has--- this is quite mild  Labs 09/04/2014.  WBC 5.0 hemoglobin 11.2 platelets 166   09/01/2014.  WBC 7.1 hemoglobin 11.2 platelets 185.  Sodium 137 potassium 4.5 BUN 26 creatinine 0.69.  2- 22nd 2016.  Sodium 143 potassium 3.8 BUN 17 creatinine 0.55 CO2 was 33.  Liver function tests within normal limits except alkaline phosphatase minimally elevated at 139 albumin 3.2.  WBC 7.1 hemoglobin 10.5 platelets 235      ASSESSMENT/PLAN:      ll  #2 history of intermittent hematuria with suspicion of a bladder lesion-as stated above per daughter does not want aggressive workup of this we are monitoring her blood work --Home health will need to draw CBC early next week to make sure hemoglobin is stable which it has been during her stay here    3                .    .                .        .          : ,   : .

## 2014-09-17 NOTE — Progress Notes (Signed)
Patient ID: Briana Schultz, female   DOB: Aug 11, 1912, 81 y.o.   MRN: [id number]           This is an acute visit  Level care skilled.  Facility [facility].  CHIEF COMPLAINT:  Acute visit possible blood clots in commode  HISTORY OF PRESENT ILLNESS:    This is a patient who lived at home, cared for by her daughter.  She was a patient here, I believe, in the summer with a right hip fracture.    Most recently, she got up to go to the bathroom, fell, and suffered a comminuted fracture of the right humerus.  This is managed conservatively.  She was seen by Dr. Romeo Apple.      She was discharged to a nursing home, lasted less than a day, and then she was readmitted to hospital with hypoxemia.  It was felt that this was secondary to atelectasis and/or possibly early pneumonia.  Her white count was 18 on admission, which quickly normalized.  A CT scan of the chest was negative for PE, showed a small pleural effusion and atelectasis.     Respiratory wise she appears to be stable  She did go home for very short while however apparently her daughter became ill and she was returned to the facility about 5 days ago.  Her stay here has been relatively unremarkable--however today her daughter did note which she thought was possibly some blood clots in the commode apparently after patient had urinated    She did have some blood discovered in her urine before her recent discharge-urinalysis and culture was done this had returned showing essentially diphtheroids.--  Dr. Leanord Hawking also ordered guaicing stools and those were negative 2 before her discharge .  Regards to her urinary issues with intermitted hematuria-I had a fairly extensive discussion with her daughter before patient's recent discharge and she does not really want aggressive workup of this secondary to her mother's advanced age and comorbidities which I feel is certainly reasonable.--Apparently a spot was found on her bladder during  her recent hospitalization although I cannot find confirmation of this-nonetheless her daughter does not really want aggressive urologic follow-up or invasive procedures  We have been monitoring her hemoglobin which appears to be stable--but will recheck this tomorrow   Patient this afternoon appears at her baseline does not complain of any increased shortness of breath weakness or dyspnea-she does have baseline very frail physical status  Of note she has completed a course of doxycycline for  abscess site right axilla area      .    PAST MEDICAL HISTORY/PROBLEM LIST:                         Hypertension.    Aortic valve stenosis.    Syncope and collapse.    Osteoarthritis.    Gastroesophageal reflux disease.    Osteoporosis.    History of scarlet fever.    Significant cervical and thoracic kyphosis.    TIA.      Right femur fracture.    Atrial fibrillation.  Not on anticoagulation due to fall risk.       Diastolic heart failure.    Proximal right humerus fracture.    On home O2 at 2 L.    Thrombocytopenia.     Malnutrition.    Frequent falls.    Mild dementia.    DNR status.    PAST SURGICAL HISTORY:  Cholecystectomy.    Cataract extraction.    Right femur fracture in June 2015.    CURRENT MEDICATIONS:          Xanax 0.5 q.h.s. p.r.n., which she has apparently been on long-term.    Tylenol thousand milligrams twice a day an additional dose when necessary.    Norvasc 5 mg QD.       Calcium carbonate 1200 daily.    Coreg 3.125 b.i.d.      Colace 100 daily.     Nexium 40 q.d.      Glucerna three times daily.      Ferrous sulfate 325 b.i.d.      Claritin 10 q.d.       Vitamin D 50,000 U every seven days.     Mobic 7.5 b.i.d.       Phenergan 12.5 every 4 hours p.r.n.           ALLERGIES/INTOLERANCES:   Allergies noted to virtually all forms of narcotics, which either cause nausea or disorientation.  Even tramadol  apparently made her disorientated during her stay in June.    She also has noted allergies to:    Amoxicillin.     Doxycycline.    Septra.    Fosamax.    Apparently mostly  allergies appear to be more intolerances with GI side effects nausea vomiting abdominal discomfort    SOCIAL HISTORY:                         HOUSING:  The patient lives with her daughter, who is her primary caregiver.   FUNCTIONAL STATUS:  She needs assistance with basic ADLs including dressing and toileting, although she is continent.  She uses a walker at home.    FAMILY HISTORY:      FATHER:  Heart failure in her father, as well as diabetes.    REVIEW OF SYSTEMS:  In general no complaints of fever or chills. Skin- No rashes or itching           CHEST/RESPIRATORY:  She is not complaining of shortness of breath.   CARDIAC:   No chest pain.    GI:  No nausea, vomiting or diarrhea today does not complaining of abdominal discomfort-     GU:  No dysuria.--But apparently some hematuria has been noted    MUSCULOSKELETAL: In the past had continued to complain of some right arm discomfort but does not really complain of thattoday    Neurologic does not complain of headache or dizziness or numbness  PHYSICAL EXAMINATION:              Temperature 98.3 pulse 96 respirations 20 blood pressure 141/58 .   GENERAL APPEARANCE:  Frail, but alert woman in no distress lying comfortably in bed Her skin is warm and dry-      CHEST/RESPIRATORY:  Severe thoracic kyphosis with very shallow air entry at the bases-  no-labored breathing.   CARDIOVASCULAR:  CARDIAC:  Heart sounds are actually regular.  There are no murmurs.  She appears to be euvolemic.  She does not have elevation of her jugular venous pressure---.   GASTROINTESTINAL:  ABDOMEN:   Soft.  No masses.-Nontender sounds are positive     t.   BILATERAL UPPER EXTREMITIES:  The right arm no longer in her sling --moves her extremities o at baseline has  significant lower extremity weakness  Radial pulse is intact on the right as well as capillary refill-.  Neurologic appears grossly intact her speech is clear.  Psych she is bright and alert largely appropriate listed as having dementia which has--- this is quite mild  Labs 09/04/2014.  WBC 5.0 hemoglobin 11.2 platelets 166   09/01/2014.  WBC 7.1 hemoglobin 11.2 platelets 185.  Sodium 137 potassium 4.5 BUN 26 creatinine 0.69.  2- 22nd 2016.  Sodium 143 potassium 3.8 BUN 17 creatinine 0.55 CO2 was 33.  Liver function tests within normal limits except alkaline phosphatase minimally elevated at 139 albumin 3.2.  WBC 7.1 hemoglobin 10.5 platelets 235      ASSESSMENT/PLAN:    #1-question hematuria-as noted above-we'll order a CBC to follow-up again will be conservative here secondary to family wishes.  We will continue to guaiac stools for now-monitor for any further bleeding  Also will order a urine culture and sensitivity  [id number]                     .    .                .        .          : ,   : .

## 2014-09-17 NOTE — Telephone Encounter (Signed)
Order printed, please fax to [facility] and advise

## 2014-09-17 NOTE — Telephone Encounter (Signed)
Orders have been faxed

## 2014-09-17 NOTE — Progress Notes (Signed)
Patient ID: Briana AdolphHallie M Schultz, female   DOB: 04/25/1913, 81 y.o.   MRN: [id number]           This is an acute visit  Level care skilled.  Facility [facility].  CHIEF COMPLAINT:  Acute visit possible blood clots in commode  HISTORY OF PRESENT ILLNESS:    This is a patient who lived at home, cared for by her daughter.  She was a patient here, I believe, in the summer with a right hip fracture.    Most recently, she got up to go to the bathroom, fell, and suffered a comminuted fracture of the right humerus.  This is managed conservatively.  She was seen by Dr. Romeo AppleHarrison.      She was discharged to a nursing home, lasted less than a day, and then she was readmitted to hospital with hypoxemia.  It was felt that this was secondary to atelectasis and/or possibly early pneumonia.  Her white count was 18 on admission, which quickly normalized.  A CT scan of the chest was negative for PE, showed a small pleural effusion and atelectasis.     Respiratory wise she appears to be stable  She did go home for very short while however apparently her daughter became ill and she was returned to the facility about 5 days ago.  Her stay here has been relatively unremarkable--however today her daughter did note which she thought was possibly some blood clots in the commode apparently after patient either urinate or had a bowel movement    She did have some blood discovered in her urine before her recent discharge-urinalysis and culture was done this had returned showing essentially diphtheroids.--  Dr. Leanord Hawkingobson also ordered guaicing stools and those were negative 2 before her discharge .  Regards to her urinary issues with intermitted hematuria-I had a fairly extensive discussion with her daughter before patient's recent discharge and she does not really want aggressive workup of this secondary to her mother's advanced age and comorbidities which I feel is certainly reasonable.--Apparently a spot was  found on her bladder during her recent hospitalization although I cannot find confirmation of this-nonetheless her daughter does not really want aggressive urologic follow-up or invasive procedures  We have been monitoring her hemoglobin which appears to be stable--but will recheck this tomorrow   Patient this afternoon appears at her baseline does not complain of any increased shortness of breath weakness or dyspnea-she does have baseline very frail physical status  Of note she has completed a course of doxycycline for  abscess site right axilla area      .    PAST MEDICAL HISTORY/PROBLEM LIST:                         Hypertension.    Aortic valve stenosis.    Syncope and collapse.    Osteoarthritis.    Gastroesophageal reflux disease.    Osteoporosis.    History of scarlet fever.    Significant cervical and thoracic kyphosis.    TIA.      Right femur fracture.    Atrial fibrillation.  Not on anticoagulation due to fall risk.       Diastolic heart failure.    Proximal right humerus fracture.    On home O2 at 2 L.    Thrombocytopenia.     Malnutrition.    Frequent falls.    Mild dementia.    DNR status.    PAST SURGICAL HISTORY:  Cholecystectomy.    Cataract extraction.    Right femur fracture in June 2015.    CURRENT MEDICATIONS:          Xanax 0.5 q.h.s. p.r.n., which she has apparently been on long-term.    Tylenol thousand milligrams twice a day an additional dose when necessary.    Norvasc 5 mg QD.       Calcium carbonate 1200 daily.    Coreg 3.125 b.i.d.      Colace 100 daily.     Nexium 40 q.d.      Glucerna three times daily.      Ferrous sulfate 325 b.i.d.      Claritin 10 q.d.       Vitamin D 50,000 U every seven days.     Mobic 7.5 Qd.       Phenergan 12.5 every 4 hours p.r.n.           ALLERGIES/INTOLERANCES:   Allergies noted to virtually all forms of narcotics, which either cause nausea or  disorientation.  Even tramadol apparently made her disorientated during her stay in June.    She also has noted allergies to:    Amoxicillin.     Doxycycline.    Septra.    Fosamax.    Apparently mostly  allergies appear to be more intolerances with GI side effects nausea vomiting abdominal discomfort    SOCIAL HISTORY:                         HOUSING:  The patient lives with her daughter, who is her primary caregiver.   FUNCTIONAL STATUS:  She needs assistance with basic ADLs including dressing and toileting, although she is continent.  She uses a walker at home.    FAMILY HISTORY:      FATHER:  Heart failure in her father, as well as diabetes.    REVIEW OF SYSTEMS:  In general no complaints of fever or chills. Skin- No rashes or itching           CHEST/RESPIRATORY:  She is not complaining of shortness of breath.   CARDIAC:   No chest pain.    GI:  No nausea, vomiting or diarrhea today-     GU:  No dysuria.    MUSCULOSKELETAL: In the past had continued to complain of some right arm discomfort but does not really complain of that this afternoon    Neurologic does not complain of headache or dizziness or numbness  PHYSICAL EXAMINATION:              Temperature 98.3 pulse 96 respirations 20 blood pressure 141/58 .   GENERAL APPEARANCE:  Frail, but alert woman in no distress Her skin is warm and dry-      CHEST/RESPIRATORY:  Severe thoracic kyphosis with very shallow air entry at the bases-  no-labored breathing.   CARDIOVASCULAR:  CARDIAC:  Heart sounds are actually regular.  There are no murmurs.  She appears to be euvolemic.  She does not have elevation of her jugular venous pressure---.   GASTROINTESTINAL:  ABDOMEN:   Soft.  No masses.-Nontender sounds are positive   GU--could not appreciate any drainage or blood vaginal area  Rectal  .. Occult blood of stool  negative per digital exam    t.   BILATERAL UPPER EXTREMITIES:  The right arm  No longer in a  sling...l --moves her extremities otherwise at baseline has significant lower extremity weakness  Radial pulse is intact on the right as well as capillary refill-.  Neurologic appears grossly intact her speech is clear.  Psych she is bright and alert largely appropriate listed as having dementia which has--- this is quite mild  Labs 09/04/2014.  WBC 5.0 hemoglobin 11.2 platelets 166   09/01/2014.  WBC 7.1 hemoglobin 11.2 platelets 185.  Sodium 137 potassium 4.5 BUN 26 creatinine 0.69.  2- 22nd 2016.  Sodium 143 potassium 3.8 BUN 17 creatinine 0.55 CO2 was 33.  Liver function tests within normal limits except alkaline phosphatase minimally elevated at 139 albumin 3.2.  WBC 7.1 hemoglobin 10.5 platelets 235      ASSESSMENT/PLAN:      ll   history of intermittent hematuria with suspicion of a bladder lesion  Vs rectal bleed..--so far occult blood testing has been negative...-as stated above per daughter does not want aggressive workup of this we are monitoring her blood work --will continue to test for occult stool blood x 3..also update lab work tomorrow--check a UA c and s...clinically stable.  Right humeral fxr--ortho note reviewed --recent xray showed varus collapse with no displacement----per ortho no change in txmt plan--not taking pain med as often which is encouraging  [id number]     3                .    .                .        .          : ,   : .

## 2014-09-18 ENCOUNTER — Other Ambulatory Visit (HOSPITAL_COMMUNITY)
Admission: AD | Admit: 2014-09-18 | Discharge: 2014-09-18 | Disposition: A | Discharge status: Home / Self Care | Payer: Medicare Other | Source: Skilled Nursing Facility | Attending: Internal Medicine | Admitting: Internal Medicine

## 2014-09-18 ENCOUNTER — Other Ambulatory Visit: Payer: Self-pay

## 2014-09-18 DIAGNOSIS — R69 Illness, unspecified: Secondary | ICD-10-CM | POA: Diagnosis present

## 2014-09-18 LAB — BASIC METABOLIC PANEL
Anion gap: 7 (ref 5–15)
BUN: 22 mg/dL (ref 6–23)
CALCIUM: 8.8 mg/dL (ref 8.4–10.5)
CO2: 34 mmol/L — ABNORMAL HIGH (ref 19–32)
Chloride: 99 mmol/L (ref 96–112)
Creatinine, Ser: 0.73 mg/dL (ref 0.50–1.10)
GFR calc Af Amer: 79 mL/min — ABNORMAL LOW (ref >90)
GFR, EST NON AFRICAN AMERICAN: 68 mL/min — AB (ref >90)
Glucose, Bld: 238 mg/dL — ABNORMAL HIGH (ref 70–99)
Potassium: 3.8 mmol/L (ref 3.5–5.1)
SODIUM: 140 mmol/L (ref 135–145)

## 2014-09-18 LAB — URINALYSIS W MICROSCOPIC (NOT AT ARMC)
BILIRUBIN URINE: NEGATIVE
Glucose, UA: NEGATIVE mg/dL
Ketones, ur: NEGATIVE mg/dL
LEUKOCYTES UA: NEGATIVE
NITRITE: NEGATIVE
Protein, ur: 30 mg/dL — AB
Specific Gravity, Urine: 1.015 (ref 1.005–1.030)
Urobilinogen, UA: 1 mg/dL (ref 0.0–1.0)
pH: 7.5 (ref 5.0–8.0)

## 2014-09-18 LAB — CBC WITH DIFFERENTIAL/PLATELET
Basophils Absolute: 0 10*3/uL (ref 0.0–0.1)
Basophils Relative: 0 % (ref 0–1)
Eosinophils Absolute: 0.2 10*3/uL (ref 0.0–0.7)
Eosinophils Relative: 2 % (ref 0–5)
HEMATOCRIT: 38.9 % (ref 36.0–46.0)
Hemoglobin: 12.5 g/dL (ref 12.0–15.0)
LYMPHS PCT: 11 % — AB (ref 12–46)
Lymphs Abs: 0.9 10*3/uL (ref 0.7–4.0)
MCH: 32.6 pg (ref 26.0–34.0)
MCHC: 32.1 g/dL (ref 30.0–36.0)
MCV: 101.3 fL — ABNORMAL HIGH (ref 78.0–100.0)
MONO ABS: 0.6 10*3/uL (ref 0.1–1.0)
Monocytes Relative: 7 % (ref 3–12)
Neutro Abs: 6.9 10*3/uL (ref 1.7–7.7)
Neutrophils Relative %: 80 % — ABNORMAL HIGH (ref 43–77)
PLATELETS: 174 10*3/uL (ref 150–400)
RBC: 3.84 MIL/uL — AB (ref 3.87–5.11)
RDW: 13.9 % (ref 11.5–15.5)
WBC: 8.7 10*3/uL (ref 4.0–10.5)

## 2014-09-18 LAB — OCCULT BLOOD X 1 CARD TO LAB, STOOL: FECAL OCCULT BLD: NEGATIVE

## 2014-09-18 MED ORDER — ALPRAZOLAM 0.5 MG PO TABS: ORAL_TABLET | ORAL | Status: DC

## 2014-09-18 NOTE — Telephone Encounter (Signed)
RX faxed to [facility] @ [phone]. Phone number [phone]  

## 2014-09-19 ENCOUNTER — Telehealth: Payer: Self-pay | Admitting: Orthopedic Surgery

## 2014-09-19 NOTE — Telephone Encounter (Signed)
Routing to Dr Harrison 

## 2014-09-19 NOTE — Telephone Encounter (Signed)
Call received from director of Rehab department at [facility], Stephenie AcresJackie Clemmons, with question regarding therapy order; states patient's daughter had requested the physical therapy due to not improving rapidly; Annice PihJackie states she does not do just range of motion, and that she would need specific information/orders, and to include whether patient has any limitations, if she is weight-bearing as tolerated, probability of gaining function, or whatever can be indicated.  Her direct phone# is [phone].

## 2014-09-20 NOTE — Telephone Encounter (Signed)
Not sure what just ROM means I ordered passive range of motion as tolerated   That's all I m ordering either do that or dont do anything   Final answer

## 2014-09-21 LAB — URINE CULTURE: Colony Count: >=100000

## 2014-09-23 NOTE — Progress Notes (Signed)
This encounter was created in error - please disregard.

## 2014-09-23 NOTE — Addendum Note (Signed)
Addended by: Adalis Gatti C on: 09/23/2014 03:25 PM   Modules accepted: Level of Service, SmartSet  

## 2014-09-23 NOTE — Progress Notes (Signed)
Patient ID: Briana Schultz, female   DOB: 03/03/1913, 81 y.o.   MRN: [id number]   Addendum-this note was created in error please disregard please disregard

## 2014-09-24 ENCOUNTER — Encounter: Payer: Self-pay | Admitting: Internal Medicine

## 2014-09-24 ENCOUNTER — Non-Acute Institutional Stay (SKILLED_NURSING_FACILITY): Payer: Medicare Other | Admitting: Internal Medicine

## 2014-09-24 ENCOUNTER — Ambulatory Visit (HOSPITAL_COMMUNITY): Payer: Medicare Other | Attending: Internal Medicine

## 2014-09-24 DIAGNOSIS — R079 Chest pain, unspecified: Secondary | ICD-10-CM | POA: Diagnosis present

## 2014-09-24 DIAGNOSIS — N10 Acute tubulo-interstitial nephritis: Secondary | ICD-10-CM

## 2014-09-24 DIAGNOSIS — R52 Pain, unspecified: Secondary | ICD-10-CM | POA: Insufficient documentation

## 2014-09-24 NOTE — Telephone Encounter (Signed)
Weight bearing status? i did not see this in note

## 2014-09-24 NOTE — Telephone Encounter (Signed)
Called Annice PihJackie, left message

## 2014-09-24 NOTE — Progress Notes (Signed)
Patient ID: Briana Schultz, female   DOB: 12/03/1912, 81 y.o.   MRN: [id number]   This is an acute visit  Level care skilled.  Facility [facility].  CHIEF COMPLAINT:  Acute visit secondary to left thorax pain  HISTORY OF PRESENT ILLNESS:    This is a patient who lived at home, cared for by her daughter.  She was a patient here, I believe, in the summer with a right hip fracture.    Most recently, she got up to go to the bathroom, fell, and suffered a comminuted fracture of the right humerus.  This is managed conservatively.  She was seen by Dr. Romeo Apple.      She was discharged to a nursing home, lasted less than a day, and then she was readmitted to hospital with hypoxemia.  It was felt that this was secondary to atelectasis and/or possibly early pneumonia.  Her white count was 18 on admission, which quickly normalized.  A CT scan of the chest was negative for PE, showed a small pleural effusion and atelectasis.     Respiratory wise she appears to be stable  She did go home for very short while however apparently her daughter became ill and she was returned to the facility .   Since her return apparently there was an episode of blood in her urine that was noted by family member-a urine culture was ordered which did grow out Klebsiella she has been started on ciprofloxacin according to nursing staff they have not noted any further bleeding-blood work was unremarkable her hemoglobin was stable at 12.5 on 09/18/2014.  Apparently yesterday patient started complaining of some left mid lower thorax pain-description she gives me is apparently she was turning  In bed and felt a pain.  She is currently on Tylenol 500 mg every 6 hours when necessary pain-she does not do well with stronger pain medication apparently tramadol caused increased confusion and narcotics also are not well tolerated .  Regards to her urinary issues with intermitted hematuria-I had a fairly extensive discussion with  her daughter before patient's recent discharge and she does not really want aggressive workup of this secondary to her mother's advanced age and comorbidities which I feel is certainly reasonable.--Apparently a spot was found on her bladder during her recent hospitalization although I cannot find confirmation of this-nonetheless her daughter does not really want aggressive urologic follow-up or invasive procedures         .    PAST MEDICAL HISTORY/PROBLEM LIST:                         Hypertension.    Aortic valve stenosis.    Syncope and collapse.    Osteoarthritis.    Gastroesophageal reflux disease.    Osteoporosis.    History of scarlet fever.    Significant cervical and thoracic kyphosis.    TIA.      Right femur fracture.    Atrial fibrillation.  Not on anticoagulation due to fall risk.       Diastolic heart failure.    Proximal right humerus fracture.    On home O2 at 2 L.    Thrombocytopenia.     Malnutrition.    Frequent falls.    Mild dementia.    DNR status.    PAST SURGICAL HISTORY:             Cholecystectomy.    Cataract extraction.    Right femur fracture  in June 2015.    CURRENT MEDICATIONS:          Xanax 0.5 q.h.s. p.r.n., which she has apparently been on long-term.    Tylenol 500 mg every 6 hours when necessary.    Norvasc 5 mg QD.       Calcium carbonate 1200 daily.    Coreg 3.125 b.i.d.      Colace 100 daily.     Nexium 40 q.d.      Glucerna three times daily.      Ferrous sulfate 325 b.i.d.      Claritin 10 q.d.       Vitamin D 50,000 U every seven days.     Mobic 7.5 b.i.d.       Phenergan 12.5 every 4 hours p.r.n.           ALLERGIES/INTOLERANCES:   Allergies noted to virtually all forms of narcotics, which either cause nausea or disorientation.  Even tramadol apparently made her disorientated during her stay in June.    She also has noted allergies to:    Amoxicillin.     Doxycycline.     Septra.    Fosamax.    Apparently mostly  allergies appear to be more intolerances with GI side effects nausea vomiting abdominal discomfort    SOCIAL HISTORY:                         HOUSING:  The patient livds with her daughter, who is her primary caregiver.   FUNCTIONAL STATUS:  She needs assistance with basic ADLs including dressing and toileting, although she is continent.  She uses a walker at home.    FAMILY HISTORY:      FATHER:  Heart failure in her father, as well as diabetes.    REVIEW OF SYSTEMS:  In general no complaints of fever or chills. Skin- No rashes or itching           CHEST/RESPIRATORY:  She is not complaining of shortness of breathor increased cough.   CARDIAC:   No chest pain.    GI:  No nausea, vomiting or diarrhea today does not complaining of abdominal discomfort-     GU:  No dysuria.--Immaturity apparently has resolved-she is on Cipro for UTI    MUSCULOSKELETAL: In the past had continued to complain of some right arm discomfort but does not really complain of thattoday--she is complaining of some mid lower left thorax pain    Neurologic does not complain of headache or dizziness or numbness  PHYSICAL EXAMINATION:              Temperature 98.4 pulse 76 respirations 18 blood pressure 147/78 O2 saturation 93% on room air .   GENERAL APPEARANCE:  Frail, but alert woman lying in bed-she appears to be somewhat anxious Her skin is warm and dry-      CHEST/RESPIRATORY:  Severe thoracic kyphosis with very shallow air entry at the bases-  no-labored breathing.   CARDIOVASCULAR:  CARDIAC:  Heart sounds are actually regular.  There are no murmurs.  She appears to be euvolemic.  She does not have elevation of her jugular venous pressure---.   GASTROINTESTINAL:  ABDOMEN:   Soft.  No masses.-Nontender sounds are positive   GU-did not appear to have overt suprapubic tenderness  t.   BILATERAL UPPER EXTREMITIES:  The right arm no longer in her sling --moves  her extremities o at baseline has significant lower extremity weakness  I did palpate her left thorax area on the left mid thorax area there is tenderness to palpation I do not note any deformity   orerythema-this is in the area of her lower rib cage-.  Neurologic appears grossly intact her speech is clear.  Psych she is bright and alert largely appropriate listed as having dementia which has--- this is quite mild  Labs  09/18/2014.  WBC 8.7 hemoglobin 12.5 platelets 174.  Sodium 140 potassium 3.8 BUN 22 creatinine 0.73 09/04/2014.  WBC 5.0 hemoglobin 11.2 platelets 166   09/01/2014.  WBC 7.1 hemoglobin 11.2 platelets 185.  Sodium 137 potassium 4.5 BUN 26 creatinine 0.69.  2- 22nd 2016.  Sodium 143 potassium 3.8 BUN 17 creatinine 0.55 CO2 was 33.  Liver function tests within normal limits except alkaline phosphatase minimally elevated at 139 albumin 3.2.  WBC 7.1 hemoglobin 10.5 platelets 235      ASSESSMENT/PLAN:    #1-left thorax pain-apparently this started yesterday-we will order an x-ray of the area continue to monitor-pain control is somewhat of an issue she does have when necessary Tylenol which apparently gives her some relief .  She also has Mobic once a day-at this point will encourage staff to give Tylenol be alert for her pain issues-this is a challenging situation with her not tolerating stronger pain medication- This will have to be monitored closely.  Addendum-I have reviewed the x-ray that has been done which is listed as a chest x-ray does not show any acute changes or pneumothorax--.  I did of a re evaluate patient later in the day and she  actually was sleeping appear to be resting comfortably which is encouraging however this will have to be closely watched.  Will monitor with vital signs every shift for now and monitor  #2 UTI positive for Klebsiella she is on a course of ciprofloxacin 250  mg twice a day for 7 days currently on day  3.  [id number]                      .    Marland Kitchen.                .Marland Kitchen

## 2014-09-26 ENCOUNTER — Ambulatory Visit: Payer: Medicare Other | Admitting: Orthopedic Surgery

## 2014-09-26 NOTE — Telephone Encounter (Signed)
dont send me any more notes on this   Send copy of all the orders we have written to her daughter and to the nursing home   Call her daughter and tell her we are sending the orders that we have written

## 2014-09-26 NOTE — Telephone Encounter (Signed)
Advised daughter [facility], [facility]

## 2014-09-26 NOTE — Telephone Encounter (Signed)
Please see below, I spoke with PT Annice PihJackie and advised her of your directions PROM only

## 2014-09-26 NOTE — Telephone Encounter (Signed)
Patients daughter is calling stating that they are very upset because [facility] any kind of physical therapy orders from us and they cant start any PT on her until they hear from us, please advise?

## 2014-09-27 ENCOUNTER — Non-Acute Institutional Stay (SKILLED_NURSING_FACILITY): Payer: Medicare Other | Admitting: Internal Medicine

## 2014-09-27 DIAGNOSIS — N10 Acute tubulo-interstitial nephritis: Secondary | ICD-10-CM

## 2014-09-27 DIAGNOSIS — L03119 Cellulitis of unspecified part of limb: Secondary | ICD-10-CM

## 2014-09-28 ENCOUNTER — Encounter: Payer: Self-pay | Admitting: Internal Medicine

## 2014-09-28 NOTE — Progress Notes (Signed)
Patient ID: Briana Schultz, female   DOB: 05/25/1913, 81 y.o.   MRN: [id number]   .  Date is 09/27/2014.  Facility [facility].  Level of care skilled.  This is an acute visit     CHIEF COMPLAINT:  Acute visit secondary recurrent right axilla lesion-  HISTORY OF PRESENT ILLNESS:    This is a patient who lived at home, cared for by her daughter.  She was a patient here, I believe, in the summer with a right hip fracture.    Most recently, she got up to go to the bathroom, fell, and suffered a comminuted fracture of the right humerus.  This is managed conservatively.  She was seen by Dr. Romeo AppleHarrison.      She was discharged to a nursing home, lasted less than a day, and then she was readmitted to hospital with hypoxemia.  It was felt that this was secondary to atelectasis and/or possibly early pneumonia.  Her white count was 18 on admission, which quickly normalized.  A CT scan of the chest was negative for PE, showed a small pleural effusion and atelectasis.     Respiratory wise she appears to be stable  She did go home for very short while however apparently her daughter became ill and she was returned to the facility .    During her initial stay she received a course of doxycycline for what appeared to be a small abscess under her right axilla-apparently this resolved unremarkably  but now has reoccurred-- Note she is completing a course of Cipro for Klebsiella UTI she appears to be relatively asymptomatic is not complaining of burning today--initial hematuria appears resolved       PAST MEDICAL HISTORY/PROBLEM LIST:                         Hypertension.    Aortic valve stenosis.    Syncope and collapse.    Osteoarthritis.    Gastroesophageal reflux disease.    Osteoporosis.    History of scarlet fever.    Significant cervical and thoracic kyphosis.    TIA.      Right femur fracture.    Atrial fibrillation.  Not on anticoagulation due to fall risk.        Diastolic heart failure.    Proximal right humerus fracture.    On home O2 at 2 L.    Thrombocytopenia.     Malnutrition.    Frequent falls.    Mild dementia.    DNR status.    PAST SURGICAL HISTORY:             Cholecystectomy.    Cataract extraction.    Right femur fracture in June 2015.    CURRENT MEDICATIONS:          Xanax 0.5 q.h.s. p.r.n., which she has apparently been on long-term.    Tylenol 500 mg every 6 hours when necessary.    Norvasc 5 mg QD.       Calcium carbonate 1200 daily.    Coreg 3.125 b.i.d.      Colace 100 daily.     Nexium 40 q.d.      Glucerna three times daily.      Ferrous sulfate 325 b.i.d.      Claritin 10 q.d.       Vitamin D 50,000 U every seven days.     Mobic 7.5 b.i.d.       Phenergan 12.5 every 4  hours p.r.n.           ALLERGIES/INTOLERANCES:   Allergies noted to virtually all forms of narcotics, which either cause nausea or disorientation.  Even tramadol apparently made her disorientated during her stay in June.    She also has noted allergies to:    Amoxicillin.     Doxycycline.    Septra.    Fosamax.    Apparently mostly  allergies appear to be more intolerances with GI side effects nausea vomiting abdominal discomfort    SOCIAL HISTORY:                         HOUSING:  The patient lives with her daughter, who is her primary caregiver.   FUNCTIONAL STATUS:  She needs assistance with basic ADLs including dressing and toileting, although she is continent.  She uses a walker at home.    FAMILY HISTORY:      FATHER:  Heart failure in her father, as well as diabetes.    REVIEW OF SYSTEMS:  In general no complaints of fever or chills. Skin- No rashes or itching--but she has what appears to be a recurrent abscess right axilla           CHEST/RESPIRATORY:  She is not complaining of shortness of breathor increased cough.   CARDIAC:   No chest pain.    GI:  No nausea, vomiting or diarrhea today does  not complaining of abdominal discomfort-     GU:  No dysuria.--Hematuria appears to have resolved --she is on Cipro for UTI    MUSCULOSKELETAL: In the past had continued to complain of some right arm discomfort but does not really complain of thattoday--had complained of some mid lower left thorax pain--but is not complaining of that today    Neurologic does not complain of headache or dizziness or numbness  PHYSICAL EXAMINATION:              Temperature 96.8 pulse 84 respirations 18 blood pressure 124/63 .   GENERAL APPEARANCE:  Frail, but alert woman lying in bed-she appears comfortable today-     Her skin is warm and dry do note under right axilla there appears to be a small raised erythematous area with a crusted center--it is movable firm-this appears similar to previous lesion several weeks ago-however appears to be less tender although does appear to have some warmth CHEST/RESPIRATORY:  Severe thoracic kyphosis with very shallow air entry at the bases- no-labored breathing.   CARDIOVASCULAR:  CARDIAC:  Heart sounds are actually regular.  There are no murmurs.  She appears to be euvolemic.  She does not have elevation of her jugular venous pressure---.   GASTROINTESTINAL:  ABDOMEN:   Soft.  No masses.-Nontender sounds are positive   GU-did not appear to have overt suprapubic tenderness  t.   BILATERAL UPPER EXTREMITIES:  The right arm no longer in her sling --moves her extremities o at baseline has significant lower extremity weakness  -.  Neurologic appears grossly intact her speech is clear.  Psych she is bright and alert largely appropriate listed as having dementia which has--- this is quite mild  Labs  09/18/2014.  Sodium 140 potassium 3.8 BUN 22 creatinine 0.73.  WBC 8.7 hemoglobin 12.5 platelets 174  09/18/2014.  WBC 8.7 hemoglobin 12.5 platelets 174.  Sodium 140 potassium 3.8 BUN 22 creatinine 0.73 09/04/2014.  WBC 5.0 hemoglobin 11.2 platelets  166   09/01/2014.  WBC 7.1 hemoglobin 11.2 platelets  185.  Sodium 137 potassium 4.5 BUN 26 creatinine 0.69.  2- 22nd 2016.  Sodium 143 potassium 3.8 BUN 17 creatinine 0.55 CO2 was 33.  Liver function tests within normal limits except alkaline phosphatase minimally elevated at 139 albumin 3.2.  WBC 7.1 hemoglobin 10.5 platelets 235      ASSESSMENT/PLAN:  #1-history of left axilla abscess-this appears to have returned Will treat her with a course of doxycycline 100 mg twice a day for 10 days-she may need a dermatology consult here secondary to reoccurrence--at this point will monitor she did respond well to doxycycline last time-she does have a listed.doxycycline allergy but she has tolerated this will monitor.  Also warm compresses twice a day to area.        #2 UTI positive for Klebsiella she is on a course of ciprofloxacin 250  mg twice a day for 7 days she will complete this tomorrow.  [id number]                      .    Marland Kitchen

## 2014-09-30 ENCOUNTER — Encounter (HOSPITAL_COMMUNITY)
Admission: RE | Admit: 2014-09-30 | Discharge: 2014-09-30 | Disposition: A | Discharge status: Home / Self Care | Payer: Medicare Other | Source: Skilled Nursing Facility | Attending: Internal Medicine | Admitting: Internal Medicine

## 2014-09-30 DIAGNOSIS — I10 Essential (primary) hypertension: Secondary | ICD-10-CM | POA: Diagnosis not present

## 2014-09-30 DIAGNOSIS — L03111 Cellulitis of right axilla: Secondary | ICD-10-CM | POA: Diagnosis not present

## 2014-09-30 DIAGNOSIS — R319 Hematuria, unspecified: Secondary | ICD-10-CM | POA: Diagnosis present

## 2014-09-30 LAB — CBC WITH DIFFERENTIAL/PLATELET
Basophils Absolute: 0.1 10*3/uL (ref 0.0–0.1)
Basophils Relative: 1 % (ref 0–1)
EOS ABS: 0.2 10*3/uL (ref 0.0–0.7)
Eosinophils Relative: 4 % (ref 0–5)
HCT: 38.7 % (ref 36.0–46.0)
Hemoglobin: 12.4 g/dL (ref 12.0–15.0)
LYMPHS ABS: 1.1 10*3/uL (ref 0.7–4.0)
LYMPHS PCT: 22 % (ref 12–46)
MCH: 32.1 pg (ref 26.0–34.0)
MCHC: 32 g/dL (ref 30.0–36.0)
MCV: 100.3 fL — ABNORMAL HIGH (ref 78.0–100.0)
MONOS PCT: 11 % (ref 3–12)
Monocytes Absolute: 0.6 10*3/uL (ref 0.1–1.0)
Neutro Abs: 3.3 10*3/uL (ref 1.7–7.7)
Neutrophils Relative %: 62 % (ref 43–77)
Platelets: 205 10*3/uL (ref 150–400)
RBC: 3.86 MIL/uL — ABNORMAL LOW (ref 3.87–5.11)
RDW: 13.5 % (ref 11.5–15.5)
WBC: 5.2 10*3/uL (ref 4.0–10.5)

## 2014-09-30 LAB — BASIC METABOLIC PANEL
Anion gap: 7 (ref 5–15)
BUN: 22 mg/dL (ref 6–23)
CHLORIDE: 99 mmol/L (ref 96–112)
CO2: 35 mmol/L — AB (ref 19–32)
Calcium: 9.2 mg/dL (ref 8.4–10.5)
Creatinine, Ser: 0.69 mg/dL (ref 0.50–1.10)
GFR calc Af Amer: 81 mL/min — ABNORMAL LOW (ref >90)
GFR calc non Af Amer: 70 mL/min — ABNORMAL LOW (ref >90)
GLUCOSE: 111 mg/dL — AB (ref 70–99)
POTASSIUM: 4 mmol/L (ref 3.5–5.1)
SODIUM: 141 mmol/L (ref 135–145)

## 2014-10-01 ENCOUNTER — Non-Acute Institutional Stay (SKILLED_NURSING_FACILITY): Payer: Medicare Other | Admitting: Internal Medicine

## 2014-10-01 DIAGNOSIS — I10 Essential (primary) hypertension: Secondary | ICD-10-CM

## 2014-10-01 DIAGNOSIS — S42301D Unspecified fracture of shaft of humerus, right arm, subsequent encounter for fracture with routine healing: Secondary | ICD-10-CM

## 2014-10-01 DIAGNOSIS — L03111 Cellulitis of right axilla: Secondary | ICD-10-CM

## 2014-10-01 DIAGNOSIS — D649 Anemia, unspecified: Secondary | ICD-10-CM | POA: Diagnosis not present

## 2014-10-01 DIAGNOSIS — K219 Gastro-esophageal reflux disease without esophagitis: Secondary | ICD-10-CM

## 2014-10-02 ENCOUNTER — Encounter: Payer: Self-pay | Admitting: Internal Medicine

## 2014-10-02 NOTE — Progress Notes (Signed)
Patient ID: Briana Schultz, female   DOB: 01/26/1913, 81 y.o.   MRN: [id number]   This is a discharge note t  Level care skilled.  Facility [facility].  CHIEF COMPLAINT:  Discharge note  HISTORY OF PRESENT ILLNESS:    This is a patient who lived at home, cared for by her daughter.  She was a patient here, I believe, in the summer with a right hip fracture.    Most recently, she got up to go to the bathroom, fell, and suffered a comminuted fracture of the right humerus.  This is managed conservatively.  She was seen by Dr. Romeo AppleHarrison.      She was discharged to a nursing home, lasted less than a day, and then she was readmitted to hospital with hypoxemia.  It was felt that this was secondary to atelectasis and/or possibly early pneumonia.  Her white count was 18 on admission, which quickly normalized.  A CT scan of the chest was negative for PE, showed a small pleural effusion and atelectasis.     Respiratory wise she appears to be stable  She did go home for very short while however apparently her daughter became ill and she  returned to the facility .   Since her return apparently there was an episode of blood in her urine that was noted by family member-a urine culture was ordered which did grow out Klebsiella she has completed a course of ciprofloxacin according to nursing staff they have not noted any further bleeding-blood work was unremarkable her hemoglobin was stable at 12.5 on 09/18/2014--and this is stable on lab done on March 28 as well with a hemoglobin of 12.4.    .  Regards to her urinary issues with intermitted hematuria-I had a fairly extensive discussion with her daughter before patient's  Most   recent discharge and she does not really want aggressive workup of this secondary to her mother's advanced age and comorbidities which I feel is certainly reasonable.--Apparently a spot was found on her bladder during her recent hospitalization although I cannot find  confirmation of this-nonetheless her daughter does not really want aggressive urologic follow-up or invasive procedures  I recent saw patient for a recurrent lesion under her right axilla-this did responded to a course of doxycycline previous time-today it appears to be improved somewhat drier although with his persistence I think this would warrant a dermatology consult-and wound care will be trying to arrange this before patient is discharged.  Currently she has no acute complaints at times her blood pressure is somewhat elevated but since she is about to go home will not be real aggressive addressing this will defer to primary care provider--she is on Coreg 3.125 mg twice a day as well as Norvasc 5 mg a day.  Her pain appears to be under somewhat better control on Tylenol she does not do well with other pain medications.   She will be going back home with her daughter who apparently is feeling better and she is in the room with her today         .    PAST MEDICAL HISTORY/PROBLEM LIST:                         Hypertension.    Aortic valve stenosis.    Syncope and collapse.    Osteoarthritis.    Gastroesophageal reflux disease.    Osteoporosis.    History of scarlet fever.  Significant cervical and thoracic kyphosis.    TIA.      Right femur fracture.    Atrial fibrillation.  Not on anticoagulation due to fall risk.       Diastolic heart failure.    Proximal right humerus fracture.    On home O2 at 2 L.    Thrombocytopenia.     Malnutrition.    Frequent falls.    Mild dementia.    DNR status.    PAST SURGICAL HISTORY:             Cholecystectomy.    Cataract extraction.    Right femur fracture in June 2015.    CURRENT MEDICATIONS:          Xanax 0.5 q.h.s. p.r.n., which she has apparently been on long-term.    Tylenol 500 mg every 6 hours when necessary.    Norvasc 5 mg QD.       Calcium carbonate 1200 daily.    Coreg 3.125 b.i.d.        Colace 100 daily.     Nexium 40 q.d.      Glucerna three times daily.      Ferrous sulfate 325 b.i.d.      Claritin 10 q.d.       Vitamin D 50,000 U every seven days.     Mobic 7.5 b.i.d.       Phenergan 12.5 every 4 hours p.r.n.           ALLERGIES/INTOLERANCES:   Allergies noted to virtually all forms of narcotics, which either cause nausea or disorientation.  Even tramadol apparently made her disorientated during her stay in June.    She also has noted allergies to:    Amoxicillin.     Doxycycline.    Septra.    Fosamax.    Apparently mostly  allergies appear to be more intolerances with GI side effects nausea vomiting abdominal discomfort    SOCIAL HISTORY:                         HOUSING:  The patient livds with her daughter, who is her primary caregiver.   FUNCTIONAL STATUS:  She needs assistance with basic ADLs including dressing and toileting, although she is continent.  She uses a walker at home.    FAMILY HISTORY:      FATHER:  Heart failure in her father, as well as diabetes.    REVIEW OF SYSTEMS:  In general no complaints of fever or chills. Skin- No rashes or itching           CHEST/RESPIRATORY:  She is not complaining of shortness of breathor increased cough.   CARDIAC:   No chest pain.    GI:  No nausea, vomiting or diarrhea today does not complaining of abdominal discomfort-     GU:  No dysuria.--Hematuria apparently has resolved-she has finished  Cipro for UTI    MUSCULOSKELETAL: In the past had continued to complain of some right arm discomfort but does not really complain of thattoday-- left thorax pain also improved    Neurologic does not complain of headache or dizziness or numbness  PHYSICAL EXAMINATION:              Temperature 98.2 pulse 100 respirations 20 blood pressure 149/81. Variable blood pressures from 98/58-151/69 I do not see persistent elevations .   GENERAL APPEARANCE:  Pleasant frail elderly female sitting in her  wheelchair comfortably Her skin is  warm and dry-lesion under her right axilla appears to be improved from previous exam there is still a hardened raised erythematous area but not tender there is crusting in the middle      CHEST/RESPIRATORY:  Severe thoracic kyphosis with very shallow air entry at the bases-  no-labored breathing.   CARDIOVASCULAR:  CARDIAC:  Heart sounds are actually regular.  There are no murmurs.  She appears to be euvolemic.  She does not have elevation of her jugular venous pressure---.   GASTROINTESTINAL:  ABDOMEN:   Soft.  No masses.-Nontender sounds are positive    t.   BILATERAL UPPER EXTREMITIES: --Moves her extremities with limited range of motion at the shoulders-this is baseline exam      -.  Neurologic appears grossly intact her speech is clear.  Psych she is bright and alert largely appropriate listed as having dementia which has--- this is quite mild  Labs  09/30/2014.  Sodium 141 potassium 4 BUN 22 creatinine 0.69 CO2 35.  CBC 5.2 hemoglobin 12.4 platelets 205  09/18/2014.  WBC 8.7 hemoglobin 12.5 platelets 174.  Sodium 140 potassium 3.8 BUN 22 creatinine 0.73 09/04/2014.  WBC 5.0 hemoglobin 11.2 platelets 166   09/01/2014.  WBC 7.1 hemoglobin 11.2 platelets 185.  Sodium 137 potassium 4.5 BUN 26 creatinine 0.69.  2- 22nd 2016.  Sodium 143 potassium 3.8 BUN 17 creatinine 0.55 CO2 was 33.  Liver function tests within normal limits except alkaline phosphatase minimally elevated at 139 albumin 3.2.  WBC 7.1 hemoglobin 10.5 platelets 235      ASSESSMENT/PLAN:    #1 history of right humerus fracture-she is followed by orthopedics she is now out of the sling appears to be doing relatively well with this follow-up with orthopedics as needed she is receiving Tylenol as well as Mobic for pain again she does not do well with stronger pain medicine--she needs PT and OT at home-as well as nursing support and CNA to help with activities of  daily living and significant weakness she has all her needed equipment at home.  #2--2 right axilla lesion-she is completing course of doxycycline this looks better-I feel she would benefit from a dermatology consult as noted above.  #3 history hypertension-again she is on Coreg as well as Norvasc she has variable readings will defer to primary care provider  #4-history of anemia-she is on iron and this appears to be stable.  #5-history of hematuria this appears to be resolving has completed treatment for a UTI with Cipro  #6-atrial fibrillation-she is not on anticoagulation secondary to fall risk again she is on Coreg for rate control.  #7-history of dementia-this appears to be quite mild.  #8-history of GERD she is on Nexium this appears to be stable.  #9-history of anxiety she continues on Xanax apparently with good relief appears to tolerate this fairly well.  #10-question history of hyperglycemia-blood sugars appear to be stable largely in the 90s-low 100s area  #10 history of diastolic CHF-this does not appear to be overtly apparent at this point continue to monitor again she is on a beta blocker  [id number] of note greater than 30 minutes spent on this discharge summary.   .                      .    .

## 2014-10-22 ENCOUNTER — Other Ambulatory Visit: Payer: Self-pay | Admitting: Internal Medicine

## 2014-11-08 ENCOUNTER — Encounter (HOSPITAL_COMMUNITY): Payer: Self-pay | Admitting: *Deleted

## 2014-11-08 ENCOUNTER — Inpatient Hospital Stay (HOSPITAL_COMMUNITY)
Admission: EM | Admit: 2014-11-08 | Discharge: 2014-11-15 | DRG: 291 | Disposition: A | Discharge status: Skilled Nursing Facility | Payer: Medicare Other | Attending: Internal Medicine | Admitting: Internal Medicine

## 2014-11-08 ENCOUNTER — Emergency Department (HOSPITAL_COMMUNITY): Payer: Medicare Other

## 2014-11-08 DIAGNOSIS — M1990 Unspecified osteoarthritis, unspecified site: Secondary | ICD-10-CM | POA: Diagnosis present

## 2014-11-08 DIAGNOSIS — J9601 Acute respiratory failure with hypoxia: Secondary | ICD-10-CM | POA: Diagnosis present

## 2014-11-08 DIAGNOSIS — I509 Heart failure, unspecified: Secondary | ICD-10-CM | POA: Diagnosis not present

## 2014-11-08 DIAGNOSIS — I10 Essential (primary) hypertension: Secondary | ICD-10-CM | POA: Diagnosis present

## 2014-11-08 DIAGNOSIS — K5900 Constipation, unspecified: Secondary | ICD-10-CM | POA: Diagnosis present

## 2014-11-08 DIAGNOSIS — Z87891 Personal history of nicotine dependence: Secondary | ICD-10-CM | POA: Diagnosis not present

## 2014-11-08 DIAGNOSIS — Z66 Do not resuscitate: Secondary | ICD-10-CM | POA: Diagnosis present

## 2014-11-08 DIAGNOSIS — R4702 Dysphasia: Secondary | ICD-10-CM | POA: Diagnosis present

## 2014-11-08 DIAGNOSIS — N390 Urinary tract infection, site not specified: Secondary | ICD-10-CM | POA: Diagnosis present

## 2014-11-08 DIAGNOSIS — F0390 Unspecified dementia without behavioral disturbance: Secondary | ICD-10-CM | POA: Diagnosis present

## 2014-11-08 DIAGNOSIS — T17908A Unspecified foreign body in respiratory tract, part unspecified causing other injury, initial encounter: Secondary | ICD-10-CM

## 2014-11-08 DIAGNOSIS — J9621 Acute and chronic respiratory failure with hypoxia: Secondary | ICD-10-CM | POA: Diagnosis present

## 2014-11-08 DIAGNOSIS — R0902 Hypoxemia: Secondary | ICD-10-CM | POA: Diagnosis not present

## 2014-11-08 DIAGNOSIS — M810 Age-related osteoporosis without current pathological fracture: Secondary | ICD-10-CM | POA: Diagnosis present

## 2014-11-08 DIAGNOSIS — I482 Chronic atrial fibrillation, unspecified: Secondary | ICD-10-CM

## 2014-11-08 DIAGNOSIS — Z8249 Family history of ischemic heart disease and other diseases of the circulatory system: Secondary | ICD-10-CM | POA: Diagnosis not present

## 2014-11-08 DIAGNOSIS — Z833 Family history of diabetes mellitus: Secondary | ICD-10-CM | POA: Diagnosis not present

## 2014-11-08 DIAGNOSIS — N179 Acute kidney failure, unspecified: Secondary | ICD-10-CM | POA: Diagnosis present

## 2014-11-08 DIAGNOSIS — I4891 Unspecified atrial fibrillation: Secondary | ICD-10-CM | POA: Diagnosis present

## 2014-11-08 DIAGNOSIS — E46 Unspecified protein-calorie malnutrition: Secondary | ICD-10-CM | POA: Diagnosis not present

## 2014-11-08 DIAGNOSIS — Z9981 Dependence on supplemental oxygen: Secondary | ICD-10-CM

## 2014-11-08 DIAGNOSIS — K219 Gastro-esophageal reflux disease without esophagitis: Secondary | ICD-10-CM | POA: Diagnosis present

## 2014-11-08 DIAGNOSIS — G9341 Metabolic encephalopathy: Secondary | ICD-10-CM | POA: Diagnosis present

## 2014-11-08 DIAGNOSIS — E43 Unspecified severe protein-calorie malnutrition: Secondary | ICD-10-CM | POA: Diagnosis present

## 2014-11-08 DIAGNOSIS — Z8673 Personal history of transient ischemic attack (TIA), and cerebral infarction without residual deficits: Secondary | ICD-10-CM

## 2014-11-08 DIAGNOSIS — R0602 Shortness of breath: Secondary | ICD-10-CM | POA: Diagnosis present

## 2014-11-08 DIAGNOSIS — I248 Other forms of acute ischemic heart disease: Secondary | ICD-10-CM | POA: Diagnosis present

## 2014-11-08 DIAGNOSIS — E876 Hypokalemia: Secondary | ICD-10-CM | POA: Diagnosis present

## 2014-11-08 DIAGNOSIS — R739 Hyperglycemia, unspecified: Secondary | ICD-10-CM | POA: Diagnosis present

## 2014-11-08 DIAGNOSIS — R7989 Other specified abnormal findings of blood chemistry: Secondary | ICD-10-CM | POA: Diagnosis present

## 2014-11-08 DIAGNOSIS — G9340 Encephalopathy, unspecified: Secondary | ICD-10-CM

## 2014-11-08 DIAGNOSIS — M419 Scoliosis, unspecified: Secondary | ICD-10-CM | POA: Diagnosis present

## 2014-11-08 DIAGNOSIS — Z681 Body mass index (BMI) 19 or less, adult: Secondary | ICD-10-CM | POA: Diagnosis not present

## 2014-11-08 DIAGNOSIS — R778 Other specified abnormalities of plasma proteins: Secondary | ICD-10-CM | POA: Diagnosis present

## 2014-11-08 DIAGNOSIS — R627 Adult failure to thrive: Secondary | ICD-10-CM | POA: Diagnosis present

## 2014-11-08 DIAGNOSIS — R112 Nausea with vomiting, unspecified: Secondary | ICD-10-CM | POA: Insufficient documentation

## 2014-11-08 DIAGNOSIS — I5033 Acute on chronic diastolic (congestive) heart failure: Secondary | ICD-10-CM | POA: Diagnosis present

## 2014-11-08 DIAGNOSIS — J690 Pneumonitis due to inhalation of food and vomit: Secondary | ICD-10-CM | POA: Diagnosis present

## 2014-11-08 DIAGNOSIS — T68XXXA Hypothermia, initial encounter: Secondary | ICD-10-CM | POA: Diagnosis present

## 2014-11-08 LAB — URINALYSIS, ROUTINE W REFLEX MICROSCOPIC
BILIRUBIN URINE: NEGATIVE
Glucose, UA: NEGATIVE mg/dL
Ketones, ur: NEGATIVE mg/dL
Leukocytes, UA: NEGATIVE
Nitrite: NEGATIVE
Protein, ur: 100 mg/dL — AB
Urobilinogen, UA: 0.2 mg/dL (ref 0.0–1.0)
pH: 5 (ref 5.0–8.0)

## 2014-11-08 LAB — URINE MICROSCOPIC-ADD ON

## 2014-11-08 LAB — COMPREHENSIVE METABOLIC PANEL
ALT: 44 U/L (ref 14–54)
AST: 84 U/L — ABNORMAL HIGH (ref 15–41)
Albumin: 4 g/dL (ref 3.5–5.0)
Alkaline Phosphatase: 90 U/L (ref 38–126)
Anion gap: 12 (ref 5–15)
BUN: 19 mg/dL (ref 6–20)
CHLORIDE: 98 mmol/L — AB (ref 101–111)
CO2: 34 mmol/L — ABNORMAL HIGH (ref 22–32)
Calcium: 9.6 mg/dL (ref 8.9–10.3)
Creatinine, Ser: 1.48 mg/dL — ABNORMAL HIGH (ref 0.44–1.00)
GFR calc Af Amer: 32 mL/min — ABNORMAL LOW (ref >60)
GFR, EST NON AFRICAN AMERICAN: 28 mL/min — AB (ref >60)
GLUCOSE: 191 mg/dL — AB (ref 70–99)
Potassium: 4.8 mmol/L (ref 3.5–5.1)
SODIUM: 144 mmol/L (ref 135–145)
Total Bilirubin: 1.1 mg/dL (ref 0.3–1.2)
Total Protein: 6.8 g/dL (ref 6.5–8.1)

## 2014-11-08 LAB — GLUCOSE, CAPILLARY
GLUCOSE-CAPILLARY: 110 mg/dL — AB (ref 70–99)
Glucose-Capillary: 203 mg/dL — ABNORMAL HIGH (ref 70–99)

## 2014-11-08 LAB — CBC WITH DIFFERENTIAL/PLATELET
BASOS ABS: 0 10*3/uL (ref 0.0–0.1)
BASOS PCT: 0 % (ref 0–1)
Eosinophils Absolute: 0 10*3/uL (ref 0.0–0.7)
Eosinophils Relative: 0 % (ref 0–5)
HCT: 42 % (ref 36.0–46.0)
HEMOGLOBIN: 13.2 g/dL (ref 12.0–15.0)
Lymphocytes Relative: 5 % — ABNORMAL LOW (ref 12–46)
Lymphs Abs: 0.6 10*3/uL — ABNORMAL LOW (ref 0.7–4.0)
MCH: 32.4 pg (ref 26.0–34.0)
MCHC: 31.4 g/dL (ref 30.0–36.0)
MCV: 102.9 fL — ABNORMAL HIGH (ref 78.0–100.0)
MONOS PCT: 7 % (ref 3–12)
Monocytes Absolute: 0.8 10*3/uL (ref 0.1–1.0)
NEUTROS ABS: 11.4 10*3/uL — AB (ref 1.7–7.7)
NEUTROS PCT: 88 % — AB (ref 43–77)
Platelets: 153 10*3/uL (ref 150–400)
RBC: 4.08 MIL/uL (ref 3.87–5.11)
RDW: 14.3 % (ref 11.5–15.5)
WBC: 12.9 10*3/uL — AB (ref 4.0–10.5)

## 2014-11-08 LAB — BLOOD GAS, ARTERIAL
ACID-BASE EXCESS: 8.9 mmol/L — AB (ref 0.0–2.0)
ACID-BASE EXCESS: 8.4 mmol/L — AB (ref 0.0–2.0)
BICARBONATE: 36.5 meq/L — AB (ref 20.0–24.0)
Bicarbonate: 35.7 mEq/L — ABNORMAL HIGH (ref 20.0–24.0)
DRAWN BY: 382351
Drawn by: 105551
FIO2: 0.32 %
FIO2: 0.28 %
O2 SAT: 88.1 %
O2 Saturation: 94.2 %
PATIENT TEMPERATURE: 37
PO2 ART: 62.8 mmHg — AB (ref 80.0–100.0)
Patient temperature: 37
TCO2: 27.9 mmol/L (ref 0–100)
TCO2: 33.6 mmol/L (ref 0–100)
pCO2 arterial: 86.4 mmHg (ref 35.0–45.0)
pCO2 arterial: 92.9 mmHg (ref 35.0–45.0)
pH, Arterial: 7.219 — ABNORMAL LOW (ref 7.350–7.450)
pH, Arterial: 7.239 — ABNORMAL LOW (ref 7.350–7.450)
pO2, Arterial: 81.7 mmHg (ref 80.0–100.0)

## 2014-11-08 LAB — MRSA PCR SCREENING: MRSA by PCR: NEGATIVE

## 2014-11-08 LAB — TROPONIN I
TROPONIN I: 0.39 ng/mL — AB (ref <0.031)
Troponin I: 0.63 ng/mL (ref <0.031)

## 2014-11-08 LAB — I-STAT CG4 LACTIC ACID, ED: LACTIC ACID, VENOUS: 6.26 mmol/L — AB (ref 0.5–2.0)

## 2014-11-08 LAB — LACTIC ACID, PLASMA: Lactic Acid, Venous: 4.9 mmol/L (ref 0.5–2.0)

## 2014-11-08 LAB — BRAIN NATRIURETIC PEPTIDE: B Natriuretic Peptide: 1330 pg/mL — ABNORMAL HIGH (ref 0.0–100.0)

## 2014-11-08 MED ORDER — ONDANSETRON HCL 4 MG PO TABS
4.0000 mg | ORAL_TABLET | Freq: Four times a day (QID) | ORAL | Status: DC | PRN
  Administered 2014-11-09: 4 mg via ORAL
  Filled 2014-11-08: qty 1

## 2014-11-08 MED ORDER — SODIUM CHLORIDE 0.9 % IJ SOLN: 3.0000 mL | INTRAMUSCULAR | Status: DC | PRN

## 2014-11-08 MED ORDER — SODIUM CHLORIDE 0.9 % IJ SOLN
3.0000 mL | Freq: Two times a day (BID) | INTRAMUSCULAR | Status: DC
  Administered 2014-11-08 – 2014-11-15 (×14): 3 mL via INTRAVENOUS

## 2014-11-08 MED ORDER — MAGNESIUM CITRATE PO SOLN: 1.0000 | Freq: Once | ORAL | Status: AC | PRN

## 2014-11-08 MED ORDER — SODIUM CHLORIDE 0.9 % IV SOLN: 250.0000 mL | INTRAVENOUS | Status: DC | PRN

## 2014-11-08 MED ORDER — PANTOPRAZOLE SODIUM 40 MG PO TBEC
40.0000 mg | DELAYED_RELEASE_TABLET | Freq: Every day | ORAL | Status: DC
  Administered 2014-11-09: 40 mg via ORAL
  Filled 2014-11-08: qty 1

## 2014-11-08 MED ORDER — IMIPENEM-CILASTATIN 250 MG IV SOLR
250.0000 mg | Freq: Once | INTRAVENOUS | Status: AC
  Administered 2014-11-08: 250 mg via INTRAVENOUS
  Filled 2014-11-08: qty 250

## 2014-11-08 MED ORDER — CARVEDILOL 3.125 MG PO TABS
3.1250 mg | ORAL_TABLET | Freq: Two times a day (BID) | ORAL | Status: DC
  Administered 2014-11-08 – 2014-11-15 (×14): 3.125 mg via ORAL
  Filled 2014-11-08 (×15): qty 1

## 2014-11-08 MED ORDER — FUROSEMIDE 10 MG/ML IJ SOLN
40.0000 mg | Freq: Two times a day (BID) | INTRAMUSCULAR | Status: DC
  Administered 2014-11-09 – 2014-11-11 (×5): 40 mg via INTRAVENOUS
  Filled 2014-11-08 (×6): qty 4

## 2014-11-08 MED ORDER — INSULIN ASPART 100 UNIT/ML ~~LOC~~ SOLN
0.0000 [IU] | Freq: Every day | SUBCUTANEOUS | Status: DC
  Administered 2014-11-08 – 2014-11-14 (×2): 2 [IU] via SUBCUTANEOUS

## 2014-11-08 MED ORDER — BISACODYL 10 MG RE SUPP
10.0000 mg | Freq: Every day | RECTAL | Status: DC | PRN
  Administered 2014-11-15: 10 mg via RECTAL
  Filled 2014-11-08: qty 1

## 2014-11-08 MED ORDER — SODIUM CHLORIDE 0.9 % IV SOLN
250.0000 mg | Freq: Two times a day (BID) | INTRAVENOUS | Status: DC
  Administered 2014-11-09 – 2014-11-12 (×8): 250 mg via INTRAVENOUS
  Filled 2014-11-08 (×14): qty 250

## 2014-11-08 MED ORDER — ACETAMINOPHEN 650 MG RE SUPP: 650.0000 mg | Freq: Four times a day (QID) | RECTAL | Status: DC | PRN

## 2014-11-08 MED ORDER — CETYLPYRIDINIUM CHLORIDE 0.05 % MT LIQD
7.0000 mL | Freq: Two times a day (BID) | OROMUCOSAL | Status: DC
  Administered 2014-11-08 – 2014-11-15 (×13): 7 mL via OROMUCOSAL

## 2014-11-08 MED ORDER — ACETAMINOPHEN 500 MG PO TABS: 500.0000 mg | ORAL_TABLET | Freq: Four times a day (QID) | ORAL | Status: DC | PRN

## 2014-11-08 MED ORDER — FUROSEMIDE 10 MG/ML IJ SOLN: 20.0000 mg | Freq: Every day | INTRAMUSCULAR | Status: DC

## 2014-11-08 MED ORDER — CHLORHEXIDINE GLUCONATE 0.12 % MT SOLN
15.0000 mL | Freq: Two times a day (BID) | OROMUCOSAL | Status: DC
  Administered 2014-11-08 – 2014-11-15 (×14): 15 mL via OROMUCOSAL
  Filled 2014-11-08 (×14): qty 15

## 2014-11-08 MED ORDER — INSULIN ASPART 100 UNIT/ML ~~LOC~~ SOLN
0.0000 [IU] | Freq: Three times a day (TID) | SUBCUTANEOUS | Status: DC
  Administered 2014-11-09 (×2): 1 [IU] via SUBCUTANEOUS
  Administered 2014-11-10: 2 [IU] via SUBCUTANEOUS
  Administered 2014-11-11: 1 [IU] via SUBCUTANEOUS
  Administered 2014-11-11 (×2): 2 [IU] via SUBCUTANEOUS
  Administered 2014-11-12: 1 [IU] via SUBCUTANEOUS
  Administered 2014-11-12: 2 [IU] via SUBCUTANEOUS
  Administered 2014-11-13: 1 [IU] via SUBCUTANEOUS
  Administered 2014-11-13: 3 [IU] via SUBCUTANEOUS
  Administered 2014-11-13: 2 [IU] via SUBCUTANEOUS
  Administered 2014-11-14: 1 [IU] via SUBCUTANEOUS
  Administered 2014-11-14: 5 [IU] via SUBCUTANEOUS
  Administered 2014-11-15 (×2): 1 [IU] via SUBCUTANEOUS

## 2014-11-08 MED ORDER — ENOXAPARIN SODIUM 30 MG/0.3ML ~~LOC~~ SOLN
30.0000 mg | SUBCUTANEOUS | Status: DC
  Administered 2014-11-08 – 2014-11-14 (×7): 30 mg via SUBCUTANEOUS
  Filled 2014-11-08 (×7): qty 0.3

## 2014-11-08 MED ORDER — ONDANSETRON HCL 4 MG/2ML IJ SOLN: 4.0000 mg | Freq: Four times a day (QID) | INTRAMUSCULAR | Status: DC | PRN

## 2014-11-08 MED ORDER — ALUM & MAG HYDROXIDE-SIMETH 200-200-20 MG/5ML PO SUSP
30.0000 mL | Freq: Four times a day (QID) | ORAL | Status: DC | PRN
  Administered 2014-11-11: 30 mL via ORAL
  Filled 2014-11-08: qty 30

## 2014-11-08 MED ORDER — GUAIFENESIN ER 600 MG PO TB12
1200.0000 mg | ORAL_TABLET | Freq: Two times a day (BID) | ORAL | Status: DC
  Administered 2014-11-08 – 2014-11-15 (×14): 1200 mg via ORAL
  Filled 2014-11-08 (×14): qty 2

## 2014-11-08 MED ORDER — DOCUSATE SODIUM 100 MG PO CAPS
100.0000 mg | ORAL_CAPSULE | Freq: Two times a day (BID) | ORAL | Status: DC
  Administered 2014-11-09 – 2014-11-12 (×7): 100 mg via ORAL
  Filled 2014-11-08 (×7): qty 1

## 2014-11-08 MED ORDER — FUROSEMIDE 10 MG/ML IJ SOLN
20.0000 mg | Freq: Once | INTRAMUSCULAR | Status: AC
  Administered 2014-11-08: 20 mg via INTRAVENOUS
  Filled 2014-11-08: qty 2

## 2014-11-08 MED ORDER — GLUCERNA SHAKE PO LIQD
237.0000 mL | Freq: Three times a day (TID) | ORAL | Status: DC
  Administered 2014-11-09 – 2014-11-15 (×15): 237 mL via ORAL

## 2014-11-08 MED ORDER — ACETAMINOPHEN 325 MG PO TABS
650.0000 mg | ORAL_TABLET | Freq: Four times a day (QID) | ORAL | Status: DC | PRN
  Administered 2014-11-09: 650 mg via ORAL
  Filled 2014-11-08 (×2): qty 2

## 2014-11-08 MED ORDER — LORATADINE 10 MG PO TABS
10.0000 mg | ORAL_TABLET | Freq: Every day | ORAL | Status: DC
  Administered 2014-11-09 – 2014-11-15 (×7): 10 mg via ORAL
  Filled 2014-11-08 (×7): qty 1

## 2014-11-08 MED ORDER — SODIUM CHLORIDE 0.9 % IV SOLN: 250.0000 mL | INTRAVENOUS | Status: AC | PRN

## 2014-11-08 MED ORDER — ALUM & MAG HYDROXIDE-SIMETH 200-200-20 MG/5ML PO SUSP: 30.0000 mL | Freq: Two times a day (BID) | ORAL | Status: DC | PRN

## 2014-11-08 NOTE — Progress Notes (Signed)
ANTIBIOTIC CONSULT NOTE  Pharmacy Consult for Primaxin Indication: pneumonia  Allergies  Allergen Reactions  . Amoxicillin Other (See Comments)    unknown  . Doxycycline Other (See Comments)    unknown  . Fosamax [Alendronate] Other (See Comments)    unknown  . Septra [Sulfamethoxazole-Trimethoprim] Other (See Comments)    unknown  . Tramadol     disoriented    Patient Measurements: Height: 5' (152.4 cm) Weight: 90 lb (40.824 kg) IBW/kg (Calculated) : 45.5  Vital Signs: Temp: 97.2 F (36.2 C) (05/06 0955) Temp Source: Rectal (05/06 0955) BP: 110/49 mmHg (05/06 1130) Pulse Rate: 84 (05/06 1130) Intake/Output from previous day:   Intake/Output from this shift: Total I/O In: -  Out: 13 [Urine:13]  Labs:  Recent Labs  11/08/14 1012  WBC 12.9*  HGB 13.2  PLT 153  CREATININE 1.48*   Estimated Creatinine Clearance: 13.3 mL/min (by C-G formula based on Cr of 1.48). No results for input(s): VANCOTROUGH, VANCOPEAK, VANCORANDOM, GENTTROUGH, GENTPEAK, GENTRANDOM, TOBRATROUGH, TOBRAPEAK, TOBRARND, AMIKACINPEAK, AMIKACINTROU, AMIKACIN in the last 72 hours.   Microbiology: No results found for this or any previous visit (from the past 720 hour(s)).  Anti-infectives    Start     Dose/Rate Route Frequency Ordered Stop   11/08/14 1230  imipenem-cilastatin (PRIMAXIN) 250 mg in sodium chloride 0.9 % 100 mL IVPB     250 mg 200 mL/hr over 30 Minutes Intravenous  Once 11/08/14 1216        Assessment: 81 yo F who presented with vomiting & low O2 sats.   CXR + superimposed PNA.  Concern for aspiration PNA vs HCAP (she was hospitalized/nursing facility stay <60 days ago). She is afebrile, but WBC and lactic acid elevated on admission.  Scr elevated- estimated CrCl ~ 10-2315ml/min.  Multiple antibiotic drug allergies noted.  No seizure history.  Levaquin per pharmacy initially ordered, however discussed with Dr Blinda LeatherwoodPollina since this will not provide anaerobic coverage.  Changed to  Primaxin per pharmacy dosing for aspiration & HCAP coverage.   Goal of Therapy:  Eradicate infection.  Plan:  Primaxin 250mg  IV q12h Monitor renal function and cx data  Duration of therapy per MD- de-escalate as clinically appropriate  Neliah Cuyler, Mercy RidingAndrea Michelle 11/08/2014,12:21 PM

## 2014-11-08 NOTE — ED Notes (Signed)
Per EMS, pt's daughter stated that the pt had emesis episode during the night and has been weak, low SPO2 on RA upper 80's.

## 2014-11-08 NOTE — ED Provider Notes (Addendum)
CSN: [id number]     Arrival date & time 11/08/14  [id number] History   First MD Initiated Contact with Patient 11/08/14 0945     Chief Complaint  Patient presents with  . Fatigue  . Emesis     (Consider location/radiation/quality/duration/timing/severity/associated sxs/prior Treatment) Patient is a 81 y.o. female presenting with vomiting. The history is provided by the EMS personnel. No language interpreter was used.  Emesis   This chart was scribed for Gilda Creasehristopher J Avery Eustice, MD by Andrew Auaven Small, ED Scribe. This patient was seen in room APA01/APA01 and the patient's care was started at 11:21 AM.  HPI Comments:  Briana Schultz is a 81 y.o. female who present to the Emergency Department complaining of Emesis. Per EMS pt came from home and was called by her daughter. States pt had 1 possible episode of emesis over night, low SAT upon arrival and pt unable to take a deep breath. Pt is not on oxygen at home but has been on oxygen since arrival. .   Daughter reports that patient was not acting like her normal self yesterday. She was weaker than usual, but able to help transfers from wheelchair to chair, etc. She reports that the patient slept through the night which is unusual. She checked on her every hour starting at 4 AM and the patient was sleeping. Sometime between 6 and 7 AM she vomited. When the daughter tried to wake her up this morning, she seemed confused, "speech was not normal". She was unable to stand at all. Daughter tried to get her up and had to lie her down on the floor.   Past Medical History  Diagnosis Date  . Fall     secondary to weakness as of  [place] OV  03/26/10  . H/O orthostatic hypotension   . Chest discomfort     anterior  . Hypertension   . Aortic valve stenosis     mild to moderate  . Syncope and collapse 2006  . Arthritis   . GERD (gastroesophageal reflux disease)   . Osteoporosis   . Abnormal EKG   . Scarlet fever   . Scoliosis   . TIA (transient ischemic  attack) 1999  . Fracture, humerus     2016  . A-fib     02/2014, not on anticoagulation   . Diastolic CHF 01/30/2014  . Proximal humerus fracture   . On home O2 08/2014    2L N/C  . Anemia   . Thrombocytopenia   . DNR (do not resuscitate) 08/2014  . Malnutrition   . Frequent falls   . Mild dementia    Past Surgical History  Procedure Laterality Date  . Cholecystectomy    . Cataract extraction Bilateral   . Femur im nail Right 12/17/2013    Procedure: INTRAMEDULLARY (IM) NAIL FEMORAL;  Surgeon: Sheral Apleyimothy D Murphy, MD;  Location: [facility] OR;  Service: Orthopedics;  Laterality: Right;   Family History  Problem Relation Age of Onset  . Pneumonia Mother   . Heart failure Father   . Diabetes Father   . Heart disease Sister    History  Substance Use Topics  . Smoking status: Former Smoker    Quit date: 01/16/1981  . Smokeless tobacco: Never Used  . Alcohol Use: No   OB History    No data available     Review of Systems  Constitutional: Positive for fatigue.  Gastrointestinal: Positive for vomiting.  Psychiatric/Behavioral: Positive for confusion.  All other systems reviewed and are  negative.  Allergies  Amoxicillin; Doxycycline; Fosamax; Septra; and Tramadol  Home Medications   Prior to Admission medications   Medication Sig Start Date End Date Taking? Authorizing Provider  acetaminophen (TYLENOL) 650 MG CR tablet Take 650 mg by mouth 3 (three) times daily.   Yes Historical Provider, MD  ALPRAZolam Prudy Feeler(XANAX) 0.5 MG tablet Take one tablet by mouth at bedtime to help rest 09/18/14  Yes Kirt BoysMonica Carter, DO  alum & mag hydroxide-simeth (MAALOX/MYLANTA) 200-200-20 MG/5ML suspension Take 30 mLs by mouth 2 (two) times daily as needed for indigestion.   Yes Historical Provider, MD  amLODipine (NORVASC) 5 MG tablet Take 10 mg by mouth daily.    Yes Historical Provider, MD  calcium carbonate (OS-CAL) 600 MG TABS Take 1,200 mg by mouth daily with breakfast.    Yes Historical Provider, MD   carvedilol (COREG) 3.125 MG tablet Take 1 tablet (3.125 mg total) by mouth 2 (two) times daily. 02/04/14  Yes Antoine PocheJonathan F Branch, MD  docusate sodium (COLACE) 100 MG capsule Take 1 capsule (100 mg total) by mouth 2 (two) times daily. Continue this while taking narcotics to help with bowel movements 12/17/13  Yes Sheral Apleyimothy D Murphy, MD  esomeprazole (NEXIUM) 40 MG capsule Take 40 mg by mouth daily at 12 noon.   Yes Historical Provider, MD  feeding supplement (GLUCERNA SHAKE) LIQD Take 237 mLs by mouth 3 (three) times daily between meals.  01/19/12  Yes Vassie Lollarlos Madera, MD  ferrous sulfate 325 (65 FE) MG tablet Take 1 tablet (325 mg total) by mouth 2 (two) times daily with a meal. 12/19/13  Yes Calvert CantorSaima Rizwan, MD  loratadine (CLARITIN) 10 MG tablet Take 10 mg by mouth daily.   Yes Historical Provider, MD  meloxicam (MOBIC) 7.5 MG tablet Take 1 tablet by mouth 2 (two) times daily. 07/12/14  Yes Historical Provider, MD  Multiple Vitamin (MULTIVITAMIN WITH MINERALS) TABS Take 1 tablet by mouth daily.   Yes Historical Provider, MD  promethazine (PHENERGAN) 25 MG tablet Take 0.5 tablets (12.5 mg total) by mouth every 4 (four) hours as needed for nausea or vomiting. 08/10/14  Yes Elliot Cousinenise Fisher, MD  Vitamin D, Ergocalciferol, (DRISDOL) 50000 UNITS CAPS capsule Take 50,000 Units by mouth every 7 (seven) days. Take on Friday.   Yes Historical Provider, MD  acetaminophen (TYLENOL) 500 MG tablet Take 500 mg by mouth every 6 (six) hours as needed.    Historical Provider, MD   BP 140/51 mmHg  Pulse 86  Temp(Src) 97.2 F (36.2 C) (Rectal)  Resp 26  Ht 5' (1.524 m)  Wt 90 lb (40.824 kg)  BMI 17.58 kg/m2  SpO2 91% Physical Exam  Constitutional: She appears well-developed. No distress.  HENT:  Head: Normocephalic and atraumatic.  Right Ear: Hearing normal.  Left Ear: Hearing normal.  Nose: Nose normal.  Mouth/Throat: Oropharynx is clear and moist and mucous membranes are normal.  Eyes: Conjunctivae and EOM are normal.  Pupils are equal, round, and reactive to light.  Neck: Normal range of motion. Neck supple.  Cardiovascular: Regular rhythm, S1 normal and S2 normal.  Exam reveals no gallop and no friction rub.   No murmur heard. Pulmonary/Chest: Effort normal. Tachypnea noted. No respiratory distress. She has decreased breath sounds. She exhibits no tenderness.  Abdominal: Soft. Normal appearance and bowel sounds are normal. There is no hepatosplenomegaly. There is no tenderness. There is no rebound, no guarding, no tenderness at McBurney's point and negative Murphy's sign. No hernia.  Musculoskeletal: Normal range of motion.  Neurological: She is alert. She has normal strength. She is disoriented. No cranial nerve deficit or sensory deficit. Coordination normal. GCS eye subscore is 4. GCS verbal subscore is 4. GCS motor subscore is 6.  Skin: Skin is warm, dry and intact. No rash noted. No cyanosis.  Psychiatric: She has a normal mood and affect. Her speech is normal and behavior is normal. Thought content normal.  Nursing note and vitals reviewed.   ED Course  Procedures (including critical care time) DIAGNOSTIC STUDIES: Oxygen Saturation is 92% on RA, adequate by my interpretation.    COORDINATION OF CARE:   Labs Review Labs Reviewed  CBC WITH DIFFERENTIAL/PLATELET - Abnormal; Notable for the following:    WBC 12.9 (*)    MCV 102.9 (*)    Neutrophils Relative % 88 (*)    Neutro Abs 11.4 (*)    Lymphocytes Relative 5 (*)    Lymphs Abs 0.6 (*)    All other components within normal limits  COMPREHENSIVE METABOLIC PANEL - Abnormal; Notable for the following:    Chloride 98 (*)    CO2 34 (*)    Glucose, Bld 191 (*)    Creatinine, Ser 1.48 (*)    AST 84 (*)    GFR calc non Af Amer 28 (*)    GFR calc Af Amer 32 (*)    All other components within normal limits  URINALYSIS, ROUTINE W REFLEX MICROSCOPIC - Abnormal; Notable for the following:    Specific Gravity, Urine >1.030 (*)    Hgb urine  dipstick TRACE (*)    Protein, ur 100 (*)    All other components within normal limits  TROPONIN I - Abnormal; Notable for the following:    Troponin I 0.39 (*)    All other components within normal limits  BRAIN NATRIURETIC PEPTIDE - Abnormal; Notable for the following:    B Natriuretic Peptide 1330.0 (*)    All other components within normal limits  URINE MICROSCOPIC-ADD ON - Abnormal; Notable for the following:    Bacteria, UA MANY (*)    All other components within normal limits  I-STAT CG4 LACTIC ACID, ED - Abnormal; Notable for the following:    Lactic Acid, Venous 6.26 (*)    All other components within normal limits  URINE CULTURE  CULTURE, BLOOD (ROUTINE X 2)  CULTURE, BLOOD (ROUTINE X 2)    Imaging Review Dg Chest 1 View  11/08/2014   CLINICAL DATA:  Shortness of Breath  EXAM: CHEST  1 VIEW  COMPARISON:  September 24, 2014  FINDINGS: There is consolidation in the medial right base. There is cardiomegaly with pulmonary venous hypertension and mild interstitial edema. No adenopathy.  IMPRESSION: Evidence of a degree of congestive heart failure. Suspect superimposed pneumonia medial right base.   Electronically Signed   By: Bretta Bang III M.D.   On: 11/08/2014 10:13     EKG Interpretation   Date/Time:  Friday Nov 08 2014 09:56:55 EDT Ventricular Rate:  86 PR Interval:  233 QRS Duration: 106 QT Interval:  432 QTC Calculation: 517 R Axis:   -59 Text Interpretation:  Sinus rhythm Atrial premature complex Prolonged PR  interval LVH with secondary repolarization abnormality Inferior infarct,  old Probable anterior infarct, age indeterminate Prolonged QT interval No  significant change since last tracing Confirmed by Luzmaria Devaux  MD,  Siddh Vandeventer [phone]) on 11/08/2014 10:03:39 AM      MDM   Final diagnoses:  Shortness of breath  Acute congestive heart failure, unspecified congestive  heart failure type  Aspiration pneumonia, unspecified aspiration pneumonia type     Patient presents to the emergency department for evaluation of generalized weakness, mental status changes, vomiting. Patient was weaker than usual yesterday during the day, but otherwise was her normal self. Overnight, however, daughter noticed that she slept longer than usual. She had an episode of emesis earlier today and then was extremely weak when she woke up. Patient unable to ambulate or even hold her weight up, which is unusual for her.  Patient noted to be mildly hypoxic. Oxygen saturations in the high 80s. She does not normally use oxygen at home, although daughter reports that she has been on oxygen in the past. She has chest x-ray consistent with volume overload and elevated BNP. Reviewing her records reveals a history of diastolic heart failure, not currently on any diuretics. Patient given Lasix 20 mg IV.  We, chest x-ray shows evidence of infiltrate in the right base, suspect aspiration pneumonia. Patient will require hospitalization for further management.   I personally performed the services described in this documentation, which was scribed in my presence. The recorded information has been reviewed and is accurate.   Addendum: Consultation with hospital pharmacist makes recommendation for Primaxin. Levaquin does not have adequate coverage for aspiration, also patient has recent hospitalization in February, and rehabilitation stay at nursing home afterwards, would technically be healthcare associated pneumonia is not aspiration.   Gilda Crease, MD 11/08/14 1124  Gilda Crease, MD 11/08/14 1215

## 2014-11-08 NOTE — Progress Notes (Signed)
UR chart review completed.  

## 2014-11-08 NOTE — ED Notes (Signed)
Pt's daughter states the pt has not urinated since 2000 yesterday, noticed vomitus on pt's chest today, pt was sleeping on her back, pt is 90% on 4L [place].

## 2014-11-08 NOTE — Progress Notes (Signed)
CRITICAL VALUE ALERT  Critical value received:  ABG: PH 7.219 PCO2 92.9  And Troponin 0.63  Date of notification:  11/08/2014    Time of notification:  1914 & 1929  Critical value read back: yes  Nurse who received alert:  Zannie Kehramika Cheney Gosch  MD notified (1st page):  Dr. Konrad DoloresMerrell  Via page paged   Time of first page:  1944  MD notified (2nd page):Dr. Connye Burkittallohan  Time of second page:  Responding MD:  Both above  Time MD responded:  2015   MD was notified that the patient was not given any of her pills due to the fact she was lethargic.  But now she was awake and I feed her her dinner but she threw it up.  New orders were given and followed.

## 2014-11-08 NOTE — Progress Notes (Signed)
Patient unable to tolerate BIPAP at this time.  Patient becomes nauseous and gags every time the mask is placed back on her. Patient is resting comfortably on 2 lpm and her O2 sat is 97% at this time. ABG fixing to be drawn.

## 2014-11-08 NOTE — Progress Notes (Signed)
Notified [name] the patients daughter of the change in the patients condition.  Voiced to her that I would be transferring her to step down ICU.  She verbalized understanding.  She asked if she should come I told her it was up to her that she did not have too but, but I wanted to let her know that there was a change in the patients condition.  She verbalized understanding.

## 2014-11-08 NOTE — ED Notes (Signed)
CRITICAL VALUE ALERT  Critical value received:  Lactic acid 4.9  Date of notification:  11/08/14  Time of notification:  1216  Critical value read back:Yes.    Nurse who received alert: Lawernce IonGreg Meadows, RN  MD notified (1st page):  Dr. Blinda LeatherwoodPollina  Time of first page:  1216  MD notified (2nd page):  Time of second page:  Responding MD:  Dr. Blinda LeatherwoodPollina  Time MD responded:  1216

## 2014-11-08 NOTE — H&P (Signed)
[facility] History and Physical  Briana AdolphHallie M Chmiel ZOX:[id number] DOB: 10/23/1912 DOA: 11/08/2014  Referring physician: Blinda Leatherwoodpollina  ED MD PCP: Pamelia HoitWILSON,FRED HENRY, MD   Chief Complaint: lethargy encephalopathy emesis  HPI: Briana Schultz is a 81 y.o. female with past medical history of chronic diastolic heart failure, A. fib, hypertension, hypoxia, anemia, recent right hip fracture she's been home from the facility approximately 5 weeks presents to the emergency department with chief complaint of acute encephalopathy recent emesis generalized weakness. Initial evaluation in the emergency department includes chest x-ray concerning for aspiration pneumonia, acute renal failure, leukocytosis, hypoxia.  Information obtained from the daughter his the primary caregiver and is at the bedside. She states over the last 3 days patient has "not been herself". She has not been eating and drinking her normal amount and has had less energy. Last night daughter noticed patient did not awaken frequently and require assistance to the bathroom as per her normal. Reports checking on her hourly she remained dry. 7 AM daughter noticed a foul odor in the room and discovered emesis on patient chest. She denies any coffee ground emesis. He states she attempted to arouse the patient and help her to the bathroom patient was unable to bear weight and was not as communicative as she usually is. In addition daughter reports patient's oxygen saturation level on room air was mid to high 80s. She reported this to her PCP and they were in the process of having home oxygen arranged. This morning the daughter reports oxygen saturation level 65%. There is been no report of recent nausea abdominal pain diarrhea in fact she has a propensity for constipation. No complaints of chest pain palpitation headache syncope or near-syncope. No dysuria hematuria frequency or urgency. No fever chills recent travel or sick contacts.  Workup in the  emergency department reveals elevated BNP and elevated lactic acid mild leukocytosis creatinine of 1.48 serum glucose of 191 and initial troponin 0.39. EKG with sinus rhythm and PACs prolonged PR and LVH In the emergency department she has a rectal temp of 97.2, stable blood pressure heart rate 84 slightly to Oxygen saturation level 90% on 4 L. She is provided with 20 mg of Lasix and Primaxin.  Review of Systems:  10 point review of systems complete with patient's primary caregiver. All systems are negative except as indicated in the history of present illness  Past Medical History  Diagnosis Date  . Fall     secondary to weakness as of  [place] OV  03/26/10  . H/O orthostatic hypotension   . Chest discomfort     anterior  . Hypertension   . Aortic valve stenosis     mild to moderate  . Syncope and collapse 2006  . Arthritis   . GERD (gastroesophageal reflux disease)   . Osteoporosis   . Abnormal EKG   . Scarlet fever   . Scoliosis   . TIA (transient ischemic attack) 1999  . Fracture, humerus     2016  . A-fib     02/2014, not on anticoagulation   . Diastolic CHF 01/30/2014  . Proximal humerus fracture   . On home O2 08/2014    2L N/C  . Anemia   . Thrombocytopenia   . DNR (do not resuscitate) 08/2014  . Malnutrition   . Frequent falls   . Mild dementia    Past Surgical History  Procedure Laterality Date  . Cholecystectomy    . Cataract extraction Bilateral   . Femur  im nail Right 12/17/2013    Procedure: INTRAMEDULLARY (IM) NAIL FEMORAL;  Surgeon: Sheral Apleyimothy D Murphy, MD;  Location: [facility] OR;  Service: Orthopedics;  Laterality: Right;   Social History:  reports that she quit smoking about 33 years ago. She has never used smokeless tobacco. She reports that she does not drink alcohol or use illicit drugs. She lives at home with her daughter minimal assist with ADLs recent fall with right hip fracture. Allergies  Allergen Reactions  . Amoxicillin Other (See Comments)    unknown    . Doxycycline Other (See Comments)    unknown  . Fosamax [Alendronate] Other (See Comments)    unknown  . Septra [Sulfamethoxazole-Trimethoprim] Other (See Comments)    unknown  . Tramadol     disoriented    Family History  Problem Relation Age of Onset  . Pneumonia Mother   . Heart failure Father   . Diabetes Father   . Heart disease Sister      Prior to Admission medications   Medication Sig Start Date End Date Taking? Authorizing Provider  acetaminophen (TYLENOL) 650 MG CR tablet Take 650 mg by mouth 3 (three) times daily.   Yes Historical Provider, MD  ALPRAZolam Prudy Feeler(XANAX) 0.5 MG tablet Take one tablet by mouth at bedtime to help rest 09/18/14  Yes Kirt BoysMonica Carter, DO  alum & mag hydroxide-simeth (MAALOX/MYLANTA) 200-200-20 MG/5ML suspension Take 30 mLs by mouth 2 (two) times daily as needed for indigestion.   Yes Historical Provider, MD  amLODipine (NORVASC) 5 MG tablet Take 10 mg by mouth daily.    Yes Historical Provider, MD  calcium carbonate (OS-CAL) 600 MG TABS Take 1,200 mg by mouth daily with breakfast.    Yes Historical Provider, MD  carvedilol (COREG) 3.125 MG tablet Take 1 tablet (3.125 mg total) by mouth 2 (two) times daily. 02/04/14  Yes Antoine PocheJonathan F Branch, MD  docusate sodium (COLACE) 100 MG capsule Take 1 capsule (100 mg total) by mouth 2 (two) times daily. Continue this while taking narcotics to help with bowel movements 12/17/13  Yes Sheral Apleyimothy D Murphy, MD  esomeprazole (NEXIUM) 40 MG capsule Take 40 mg by mouth daily at 12 noon.   Yes Historical Provider, MD  feeding supplement (GLUCERNA SHAKE) LIQD Take 237 mLs by mouth 3 (three) times daily between meals.  01/19/12  Yes Vassie Lollarlos Madera, MD  ferrous sulfate 325 (65 FE) MG tablet Take 1 tablet (325 mg total) by mouth 2 (two) times daily with a meal. 12/19/13  Yes Calvert CantorSaima Rizwan, MD  loratadine (CLARITIN) 10 MG tablet Take 10 mg by mouth daily.   Yes Historical Provider, MD  meloxicam (MOBIC) 7.5 MG tablet Take 1 tablet by mouth  2 (two) times daily. 07/12/14  Yes Historical Provider, MD  Multiple Vitamin (MULTIVITAMIN WITH MINERALS) TABS Take 1 tablet by mouth daily.   Yes Historical Provider, MD  promethazine (PHENERGAN) 25 MG tablet Take 0.5 tablets (12.5 mg total) by mouth every 4 (four) hours as needed for nausea or vomiting. 08/10/14  Yes Elliot Cousinenise Fisher, MD  Vitamin D, Ergocalciferol, (DRISDOL) 50000 UNITS CAPS capsule Take 50,000 Units by mouth every 7 (seven) days. Take on Friday.   Yes Historical Provider, MD  acetaminophen (TYLENOL) 500 MG tablet Take 500 mg by mouth every 6 (six) hours as needed.    Historical Provider, MD   Physical Exam: Filed Vitals:   11/08/14 0955 11/08/14 0958 11/08/14 1100 11/08/14 1130  BP:   103/52 110/49  Pulse:  86 81 84  Temp: 97.2 F (36.2 C)     TempSrc: Rectal     Resp:  Height:      Weight:      SpO2:  91% 100% 97%    Wt Readings from Last 3 Encounters:  11/08/14 40.824 kg (90 lb)  09/10/14 41.549 kg (91 lb 9.6 oz)  08/14/14 41.549 kg (91 lb 9.6 oz)    General:  Appears thin frail lethargic Eyes: PERRL, normal lids, irises & conjunctiva ENT: grossly normal hearing, his membranes of her mouth are dry slightly pale Neck: no LAD, masses or thyromegaly Cardiovascular: RRR, no m/r/g. No LE edema.  Respiratory:  Normal respiratory effort but shallow. Breath sounds diminished throughout. I hear no wheeze or crackles Abdomen: soft, ntnd Skin: no rash or induration seen on limited exam Musculoskeletal: grossly normal tone BUE/BLE Psychiatric: grossly normal mood and affect, speech fluent and appropriate Neurologic: Patient attempts to arouse to verbal stimuli. Attempts to follow commands. Attempts to answer questions. Lateral grip is 2 out of 5.           Labs on Admission:  Basic Metabolic Panel:  Recent Labs Lab 11/08/14 1012  NA 144  K 4.8  CL 98*  CO2 34*  GLUCOSE 191*  BUN 19  CREATININE 1.48*  CALCIUM 9.6   Liver Function Tests:  Recent  Labs Lab 11/08/14 1012  AST 84*  ALT 44  ALKPHOS 90  BILITOT 1.1  PROT 6.8  ALBUMIN 4.0   No results for input(s): LIPASE, AMYLASE in the last 168 hours. No results for input(s): AMMONIA in the last 168 hours. CBC:  Recent Labs Lab 11/08/14 1012  WBC 12.9*  NEUTROABS 11.4*  HGB 13.2  HCT 42.0  MCV 102.9*  PLT 153   Cardiac Enzymes:  Recent Labs Lab 11/08/14 1012  TROPONINI 0.39*    BNP (last 3 results)  Recent Labs  08/11/14 1530 11/08/14 1012  BNP 879.0* 1330.0*    ProBNP (last 3 results)  Recent Labs  01/02/14 1212  PROBNP 456.0*    CBG: No results for input(s): GLUCAP in the last 168 hours.  Radiological Exams on Admission: Dg Chest 1 View  11/08/2014   CLINICAL DATA:  Shortness of Breath  EXAM: CHEST  1 VIEW  COMPARISON:  September 24, 2014  FINDINGS: There is consolidation in the medial right base. There is cardiomegaly with pulmonary venous hypertension and mild interstitial edema. No adenopathy.  IMPRESSION: Evidence of a degree of congestive heart failure. Suspect superimposed pneumonia medial right base.   Electronically Signed   By: Bretta Bang III M.D.   On: 11/08/2014 10:13    EKG: With sinus rhythm, PACs, LVH  Assessment/Plan Principal Problem:   Hypoxia: Likely acute on chronic secondary to healthcare acquired pneumonia/aspiration pneumonia/CHF exacerbation in the setting of history of same since recent hospitalization about to be placed on home oxygen. Continue oxygen supplementation.  Primaxin per pharmacy. Continue low-dose daily Lasix. Monitor intake and output and obtain daily weight Active Problems:  Diastolic dysfunction with acute on chronic heart failure: Received 20 mg of Lasix IV in the emergency department. Daughter reports there is been no urine output since 8 PM last evening. ProBNP 1330. Echo done in June 2015 yields an EF of 60% and mild LVH. Home medications include Coreg which we will continue. Monitor intake and output  obtain daily weights   Aspiration pneumonia: Prior chest x-ray. Patient was in nursing facility less than 60 days ago technically healthcare  associated pneumonia. Primaxin per pharmacy. Lactic acid elevated. See #1.  ARF (acute renal failure): Likely related to above. Hold nephrotoxins. Monitor urine output.   Elevated troponin; no complaints of chest pain. May be related to demand in setting of acute renal failure. We'll cycle troponins. EKG without acute changes.    Hyperglycemia: Obtain a hemoglobin A1c use sliding scale insulin for optimal control. No documented history of diabetes    Hypothermia: Likely related to #3. Bear hugger    Acute encephalopathy: Likely related to #1, 2 and 3. See treatments for these problems. If no improvement consider CT of the head    HTN (hypertension): Blood pressure stable. Home medications include Norvasc, Coreg. I will hold the Norvasc and continue the Coreg.    Protein calorie malnutrition: BMI 17.6    UTI: Urine culture pending      Code Status: DNR DVT Prophylaxis: Family Communication: daughter at bedside Disposition Plan: home when ready  Time spent: 10 minutes  [facility] Pager [phone]

## 2014-11-09 ENCOUNTER — Inpatient Hospital Stay (HOSPITAL_COMMUNITY): Payer: Medicare Other

## 2014-11-09 DIAGNOSIS — R7989 Other specified abnormal findings of blood chemistry: Secondary | ICD-10-CM

## 2014-11-09 DIAGNOSIS — I5033 Acute on chronic diastolic (congestive) heart failure: Principal | ICD-10-CM

## 2014-11-09 DIAGNOSIS — J9601 Acute respiratory failure with hypoxia: Secondary | ICD-10-CM

## 2014-11-09 LAB — BLOOD GAS, ARTERIAL
Acid-Base Excess: 12.2 mmol/L — ABNORMAL HIGH (ref 0.0–2.0)
BICARBONATE: 38.4 meq/L — AB (ref 20.0–24.0)
Drawn by: 23534
O2 Content: 2 L/min
O2 Saturation: 93.5 %
PATIENT TEMPERATURE: 37
PH ART: 7.337 — AB (ref 7.350–7.450)
PO2 ART: 72 mmHg — AB (ref 80.0–100.0)
TCO2: 35.3 mmol/L (ref 0–100)
pCO2 arterial: 73.7 mmHg (ref 35.0–45.0)

## 2014-11-09 LAB — GLUCOSE, CAPILLARY
GLUCOSE-CAPILLARY: 125 mg/dL — AB (ref 70–99)
GLUCOSE-CAPILLARY: 112 mg/dL — AB (ref 70–99)
Glucose-Capillary: 126 mg/dL — ABNORMAL HIGH (ref 70–99)
Glucose-Capillary: 186 mg/dL — ABNORMAL HIGH (ref 70–99)

## 2014-11-09 LAB — COMPREHENSIVE METABOLIC PANEL
ALBUMIN: 3.7 g/dL (ref 3.5–5.0)
ALT: 43 U/L (ref 14–54)
ANION GAP: 10 (ref 5–15)
AST: 50 U/L — AB (ref 15–41)
Alkaline Phosphatase: 79 U/L (ref 38–126)
BUN: 28 mg/dL — AB (ref 6–20)
CO2: 34 mmol/L — ABNORMAL HIGH (ref 22–32)
CREATININE: 1.34 mg/dL — AB (ref 0.44–1.00)
Calcium: 8.7 mg/dL — ABNORMAL LOW (ref 8.9–10.3)
Chloride: 95 mmol/L — ABNORMAL LOW (ref 101–111)
GFR calc Af Amer: 37 mL/min — ABNORMAL LOW (ref >60)
GFR calc non Af Amer: 32 mL/min — ABNORMAL LOW (ref >60)
Glucose, Bld: 103 mg/dL — ABNORMAL HIGH (ref 70–99)
Potassium: 4.8 mmol/L (ref 3.5–5.1)
Sodium: 139 mmol/L (ref 135–145)
TOTAL PROTEIN: 6.3 g/dL — AB (ref 6.5–8.1)
Total Bilirubin: 0.9 mg/dL (ref 0.3–1.2)

## 2014-11-09 LAB — CBC
HCT: 37.1 % (ref 36.0–46.0)
Hemoglobin: 11.8 g/dL — ABNORMAL LOW (ref 12.0–15.0)
MCH: 32.3 pg (ref 26.0–34.0)
MCHC: 31.8 g/dL (ref 30.0–36.0)
MCV: 101.6 fL — ABNORMAL HIGH (ref 78.0–100.0)
Platelets: 145 10*3/uL — ABNORMAL LOW (ref 150–400)
RBC: 3.65 MIL/uL — ABNORMAL LOW (ref 3.87–5.11)
RDW: 14.1 % (ref 11.5–15.5)
WBC: 12 10*3/uL — ABNORMAL HIGH (ref 4.0–10.5)

## 2014-11-09 LAB — TROPONIN I: Troponin I: 0.59 ng/mL (ref <0.031)

## 2014-11-09 MED ORDER — SODIUM CHLORIDE 0.9 % IV SOLN
INTRAVENOUS | Status: AC
  Filled 2014-11-09: qty 250

## 2014-11-09 MED ORDER — MORPHINE SULFATE 2 MG/ML IJ SOLN
1.0000 mg | INTRAMUSCULAR | Status: DC | PRN
  Administered 2014-11-11: 1 mg via INTRAVENOUS
  Filled 2014-11-09: qty 1

## 2014-11-09 MED ORDER — IPRATROPIUM-ALBUTEROL 0.5-2.5 (3) MG/3ML IN SOLN
3.0000 mL | RESPIRATORY_TRACT | Status: DC
  Administered 2014-11-09 – 2014-11-10 (×6): 3 mL via RESPIRATORY_TRACT
  Filled 2014-11-09 (×6): qty 3

## 2014-11-09 MED ORDER — PANTOPRAZOLE SODIUM 40 MG IV SOLR
40.0000 mg | Freq: Two times a day (BID) | INTRAVENOUS | Status: DC
  Administered 2014-11-09 – 2014-11-11 (×4): 40 mg via INTRAVENOUS
  Filled 2014-11-09 (×4): qty 40

## 2014-11-09 MED ORDER — ONDANSETRON HCL 4 MG/2ML IJ SOLN
4.0000 mg | Freq: Four times a day (QID) | INTRAMUSCULAR | Status: DC
  Administered 2014-11-09 – 2014-11-15 (×14): 4 mg via INTRAVENOUS
  Filled 2014-11-09 (×16): qty 2

## 2014-11-09 MED ORDER — ALPRAZOLAM 0.5 MG PO TABS
0.5000 mg | ORAL_TABLET | Freq: Two times a day (BID) | ORAL | Status: DC | PRN
  Administered 2014-11-09 – 2014-11-12 (×3): 0.5 mg via ORAL
  Filled 2014-11-09 (×4): qty 1

## 2014-11-09 MED ORDER — ONDANSETRON HCL 4 MG PO TABS
4.0000 mg | ORAL_TABLET | Freq: Four times a day (QID) | ORAL | Status: DC
  Administered 2014-11-10 – 2014-11-14 (×9): 4 mg via ORAL
  Filled 2014-11-09 (×9): qty 1

## 2014-11-09 NOTE — Progress Notes (Signed)
Patient resting, placed back on Bipap at 35% after critical ABG results; no complaints at this time; will continue to monitor

## 2014-11-09 NOTE — Progress Notes (Signed)
Patient not tolerating Bipap at this time, gagging and coughing, patient placed on nasal cannula at 3L; will continue to monitor

## 2014-11-09 NOTE — Progress Notes (Signed)
CRITICAL VALUE ALERT  Critical value received:  ABG results  Date of notification:  11/08/2014  Time of notification:  2355  Critical value read back:Yes.    Nurse who received alert:  Guilford Shi Mcleod, RN  MD notified (1st page):  Benedetto Coons Callahan, NP  Time of first page:  2357  MD notified (2nd page):  Time of second page:  Responding MD:  Benedetto Coons Callahan, NP  Time MD responded:  0000  No new orders received, continue Bipap as patient tolerates

## 2014-11-09 NOTE — Progress Notes (Signed)
RN placed patient back on BIPAP at 00:00. Patient seems to be tolerating so far and is resting well.

## 2014-11-09 NOTE — Progress Notes (Addendum)
INITIAL NUTRITION ASSESSMENT  DOCUMENTATION CODES Per approved criteria  -Not Applicable   INTERVENTION: Recommend Swallow evaluation before advancement of diet  Magic cup BID each supplement provides 290 kcal and 9 grams of protein  Recommend MVI with minerals  NUTRITION DIAGNOSIS: Inadequate oral intake related to acute illness as evidenced by a reported poor intake over last few days.   Goal: Pt to meet >/= 90% of their estimated nutrition needs   Monitor:  Oral intake, swallow eval, fluid status, labs  Reason for Assessment: c/s assess status  81 y.o. female  Admitting Dx: Acute respiratory failure with hypoxia  ASSESSMENT: 81 y.o. female PMHx CHF, A. fib, HTN, hypoxia, anemia and recent hip fracture. Presents to ED w/ chief complaint of acute encephalopathy, emesis, generalized weakness. Initial evaluation in the ED includes chest x-ray concerning for aspiration pna, acute renal failure, leukocytosis, hypoxia.  Per notes: Family reports that her by mouth intake has been poor over last few days  Pts weight has been stable over the past 2 months. Her long term trend of weight loss not relevant to this admission  Height: Ht Readings from Last 1 Encounters:  11/09/14 4\' 8"  (1.422 m)    Weight: Wt Readings from Last 1 Encounters:  11/09/14 97 lb 3.6 oz (44.1 kg)    Ideal Body Weight: 93 lbs  % Ideal Body Weight: 97%  Wt Readings from Last 10 Encounters:  11/09/14 97 lb 3.6 oz (44.1 kg)  09/10/14 91 lb 9.6 oz (41.549 kg)  08/14/14 91 lb 9.6 oz (41.549 kg)  08/09/14 97 lb 3.6 oz (44.1 kg)  02/06/14 95 lb 6.4 oz (43.273 kg)  02/04/14 102 lb (46.267 kg)  01/21/14 96 lb (43.545 kg)  01/02/14 96 lb (43.545 kg)  12/22/13 96 lb (43.545 kg)  01/22/12 102 lb 15.3 oz (46.7 kg)  Admit weight 90 lbs (41 kg)  Usual Body Weight: 96-97 lbs  % Usual Body Weight: 94%  BMI:  Body mass index is 21.81 kg/(m^2).  Estimated Nutritional Needs: Kcal: 1300-1500 (32-37  kca//kg) Protein: >62 (1.5 g/kg) Fluid: 1.2 liters  Skin: Dry, ecchymosis  Diet Order: DIET - DYS 1 Room service appropriate?: Yes; Fluid consistency:: Thin  EDUCATION NEEDS: -No education needs identified at this time   Intake/Output Summary (Last 24 hours) at 11/09/14 1225 Last data filed at 11/09/14 1100  Gross per 24 hour  Intake    418 ml  Output    900 ml  Net   -482 ml    Last BM: 5/6  Labs:   Recent Labs Lab 11/08/14 1012 11/09/14 0457  NA 144 139  K 4.8 4.8  CL 98* 95*  CO2 34* 34*  BUN 19 28*  CREATININE 1.48* 1.34*  CALCIUM 9.6 8.7*  GLUCOSE 191* 103*    CBG (last 3)   Recent Labs  11/08/14 2109 11/09/14 0720 11/09/14 1113  GLUCAP 203* 125* 112*    Scheduled Meds: . antiseptic oral rinse  7 mL Mouth Rinse q12n4p  . carvedilol  3.125 mg Oral BID  . chlorhexidine  15 mL Mouth Rinse BID  . docusate sodium  100 mg Oral BID  . enoxaparin (LOVENOX) injection  30 mg Subcutaneous Q24H  . feeding supplement (GLUCERNA SHAKE)  237 mL Oral TID BM  . furosemide  40 mg Intravenous BID  . guaiFENesin  1,200 mg Oral BID  . imipenem-cilastatin  250 mg Intravenous Q12H  . insulin aspart  0-5 Units Subcutaneous QHS  . insulin aspart  0-9 Units Subcutaneous TID WC  . ipratropium-albuterol  3 mL Nebulization Q4H  . loratadine  10 mg Oral Daily  . ondansetron (ZOFRAN) IV  4 mg Intravenous Q6H   Or  . ondansetron  4 mg Oral Q6H  . pantoprazole (PROTONIX) IV  40 mg Intravenous Q12H  . sodium chloride  3 mL Intravenous Q12H    Continuous Infusions:   Past Medical History  Diagnosis Date  . Fall     secondary to weakness as of  [place] OV  03/26/10  . H/O orthostatic hypotension   . Chest discomfort     anterior  . Hypertension   . Aortic valve stenosis     mild to moderate  . Syncope and collapse 2006  . Arthritis   . GERD (gastroesophageal reflux disease)   . Osteoporosis   . Abnormal EKG   . Scarlet fever   . Scoliosis   . TIA (transient  ischemic attack) 1999  . Fracture, humerus     2016  . A-fib     02/2014, not on anticoagulation   . Diastolic CHF 01/30/2014  . Proximal humerus fracture   . On home O2 08/2014    2L N/C  . Anemia   . Thrombocytopenia   . DNR (do not resuscitate) 08/2014  . Malnutrition   . Frequent falls   . Mild dementia     Past Surgical History  Procedure Laterality Date  . Cholecystectomy    . Cataract extraction Bilateral   . Femur im nail Right 12/17/2013    Procedure: INTRAMEDULLARY (IM) NAIL FEMORAL;  Surgeon: Sheral Apleyimothy D Murphy, MD;  Location: [facility] OR;  Service: Orthopedics;  Laterality: Right;    Christophe LouisNathan Alejandrina Raimer RD, LDN Nutrition Pager: [phone] 11/09/2014 12:25 PM

## 2014-11-09 NOTE — Progress Notes (Signed)
BIPAP is in standby mode at this time.

## 2014-11-09 NOTE — Progress Notes (Signed)
[facility] PROGRESS NOTE  Briana Schultz ZOX:[id number] DOB: 04/19/1913 DOA: 11/08/2014 PCP: Pamelia HoitWILSON,FRED HENRY, MD  Assessment/Plan: 1. Acute respiratory failure with hypercapnia. Patient required to be transferred to the stepdown unit and started on BiPAP. Likely related to congestive heart failure. Her PCO2 is improving with therapy. She is now on nasal cannula. Will repeat ABG this morning. Repeat chest x-ray this morning. Continue current treatments. 2. Metabolic encephalopathy related to hypercapnia. Appears to be improving. 3. Aspiration pneumonia. Patient had vomited prior to admission. Her family reports that she often complains of difficulty eating/swallowing. We'll place her on pureed diet for now. May need swallowing evaluation. Continue intravenous antibiotics for now. 4. Acute on chronic diastolic congestive heart failure. Currently on intravenous Lasix. Continue to monitor intake and output. Repeat chest x-ray today. 5. Elevated troponin. Likely related to demand ischemia/troponin leak in the setting of CHF. EKG is nonacute and she is not having any chest pain. Continue to monitor. 6. Hypertension. Blood pressure is currently stable. Continue current treatments. 7. Severe protein calorie malnutrition. Nutrition consult  Code Status: DNR Family Communication: discussed with grand daughter at the bedside Disposition Plan: pending hospital course   Consultants:    Procedures:    Antibiotics:  primaxin 5/6>>  HPI/Subjective: Patient was moved to step down unit overnight for elevated pCO2 and need for bipap. She became agitated overnight. She is not coughing. Her family reports that she was having difficulty swallowing bread this morning. She is much more alert this morning.  Objective: Filed Vitals:   11/09/14 0827  BP: 130/59  Pulse: 75  Temp:   Resp:     Intake/Output Summary (Last 24 hours) at 11/09/14 0902 Last data filed at 11/09/14 0500  Gross per 24  hour  Intake    268 ml  Output    163 ml  Net    105 ml   Filed Weights   11/08/14 1243 11/08/14 2100 11/09/14 0500  Weight: 41.9 kg (92 lb 6 oz) 43.2 kg (95 lb 3.8 oz) 44.1 kg (97 lb 3.6 oz)    Exam:   General:  NAD  Cardiovascular: S1, s2, rrr  Respiratory: crackles at bases, diminished breath sounds  Abdomen: soft, nt, nd, bs+  Musculoskeletal: 1+ edema b/l   Data Reviewed: Basic Metabolic Panel:  Recent Labs Lab 11/08/14 1012 11/09/14 0457  NA 144 139  K 4.8 4.8  CL 98* 95*  CO2 34* 34*  GLUCOSE 191* 103*  BUN 19 28*  CREATININE 1.48* 1.34*  CALCIUM 9.6 8.7*   Liver Function Tests:  Recent Labs Lab 11/08/14 1012 11/09/14 0457  AST 84* 50*  ALT 44 43  ALKPHOS 90 79  BILITOT 1.1 0.9  PROT 6.8 6.3*  ALBUMIN 4.0 3.7   No results for input(s): LIPASE, AMYLASE in the last 168 hours. No results for input(s): AMMONIA in the last 168 hours. CBC:  Recent Labs Lab 11/08/14 1012 11/09/14 0457  WBC 12.9* 12.0*  NEUTROABS 11.4*  --   HGB 13.2 11.8*  HCT 42.0 37.1  MCV 102.9* 101.6*  PLT 153 145*   Cardiac Enzymes:  Recent Labs Lab 11/08/14 1012 11/08/14 1806 11/09/14 0457  TROPONINI 0.39* 0.63* 0.59*   BNP (last 3 results)  Recent Labs  08/11/14 1530 11/08/14 1012  BNP 879.0* 1330.0*    ProBNP (last 3 results)  Recent Labs  01/02/14 1212  PROBNP 456.0*    CBG:  Recent Labs Lab 11/08/14 1710 11/08/14 2109 11/09/14 0720  GLUCAP 110* 203*  125*    Recent Results (from the past 240 hour(s))  MRSA PCR Screening     Status: None   Collection Time: 11/08/14  8:36 PM  Result Value Ref Range Status   MRSA by PCR NEGATIVE NEGATIVE Final    Comment:        The GeneXpert MRSA Assay (FDA approved for NASAL specimens only), is one component of a comprehensive MRSA colonization surveillance program. It is not intended to diagnose MRSA infection nor to guide or monitor treatment for MRSA infections.      Studies: Dg Chest  1 View  11/08/2014   CLINICAL DATA:  Shortness of Breath  EXAM: CHEST  1 VIEW  COMPARISON:  September 24, 2014  FINDINGS: There is consolidation in the medial right base. There is cardiomegaly with pulmonary venous hypertension and mild interstitial edema. No adenopathy.  IMPRESSION: Evidence of a degree of congestive heart failure. Suspect superimposed pneumonia medial right base.   Electronically Signed   By: Bretta BangWilliam  Woodruff III M.D.   On: 11/08/2014 10:13    Scheduled Meds: . antiseptic oral rinse  7 mL Mouth Rinse q12n4p  . carvedilol  3.125 mg Oral BID  . chlorhexidine  15 mL Mouth Rinse BID  . docusate sodium  100 mg Oral BID  . enoxaparin (LOVENOX) injection  30 mg Subcutaneous Q24H  . feeding supplement (GLUCERNA SHAKE)  237 mL Oral TID BM  . furosemide  40 mg Intravenous BID  . guaiFENesin  1,200 mg Oral BID  . imipenem-cilastatin  250 mg Intravenous Q12H  . insulin aspart  0-5 Units Subcutaneous QHS  . insulin aspart  0-9 Units Subcutaneous TID WC  . ipratropium-albuterol  3 mL Nebulization Q4H  . loratadine  10 mg Oral Daily  . pantoprazole  40 mg Oral Daily  . sodium chloride  3 mL Intravenous Q12H   Continuous Infusions:   Principal Problem:   Acute respiratory failure with hypoxia Active Problems:   HTN (hypertension)   Protein calorie malnutrition   ARF (acute renal failure)   Elevated troponin   CHF (congestive heart failure)   Aspiration pneumonia   Hyperglycemia   Hypothermia   Diastolic dysfunction with acute on chronic heart failure   Acute encephalopathy   UTI (urinary tract infection)   Congestive heart disease    Time spent: 30mins    MEMON,JEHANZEB  [facility] Pager [phone]. If 7PM-7AM, please contact night-coverage at [email or web address], password [email or web address] 11/09/2014, 9:02 AM  LOS: 1 day

## 2014-11-09 NOTE — Progress Notes (Signed)
Patient woke up agitated and confused. Taken off BIPAP for a little while.

## 2014-11-09 NOTE — Progress Notes (Signed)
Patient transferred from 300 to ICU room 2; connected to bedside cardiac monitor, placed on Bipap, oxygen titrated up to keep O2 sats greater than 90%, CHG bath completed; assessment completed see main flow sheet for details; will continue to monitor and document any changes

## 2014-11-10 LAB — GLUCOSE, CAPILLARY
GLUCOSE-CAPILLARY: 104 mg/dL — AB (ref 70–99)
Glucose-Capillary: 176 mg/dL — ABNORMAL HIGH (ref 70–99)
Glucose-Capillary: 170 mg/dL — ABNORMAL HIGH (ref 70–99)
Glucose-Capillary: 106 mg/dL — ABNORMAL HIGH (ref 70–99)
Glucose-Capillary: 139 mg/dL — ABNORMAL HIGH (ref 70–99)

## 2014-11-10 LAB — CBC
HEMATOCRIT: 33.8 % — AB (ref 36.0–46.0)
Hemoglobin: 10.9 g/dL — ABNORMAL LOW (ref 12.0–15.0)
MCH: 32.6 pg (ref 26.0–34.0)
MCHC: 32.2 g/dL (ref 30.0–36.0)
MCV: 101.2 fL — AB (ref 78.0–100.0)
Platelets: 118 10*3/uL — ABNORMAL LOW (ref 150–400)
RBC: 3.34 MIL/uL — AB (ref 3.87–5.11)
RDW: 13.9 % (ref 11.5–15.5)
WBC: 7.5 10*3/uL (ref 4.0–10.5)

## 2014-11-10 LAB — URINE CULTURE
CULTURE: NO GROWTH
Colony Count: NO GROWTH

## 2014-11-10 LAB — BASIC METABOLIC PANEL
Anion gap: 6 (ref 5–15)
BUN: 31 mg/dL — ABNORMAL HIGH (ref 6–20)
CALCIUM: 8.3 mg/dL — AB (ref 8.9–10.3)
CO2: 43 mmol/L — AB (ref 22–32)
CREATININE: 0.84 mg/dL (ref 0.44–1.00)
Chloride: 92 mmol/L — ABNORMAL LOW (ref 101–111)
GFR calc Af Amer: >60 mL/min (ref >60)
GFR, EST NON AFRICAN AMERICAN: 56 mL/min — AB (ref >60)
GLUCOSE: 103 mg/dL — AB (ref 70–99)
Potassium: 3.4 mmol/L — ABNORMAL LOW (ref 3.5–5.1)
SODIUM: 141 mmol/L (ref 135–145)

## 2014-11-10 LAB — BLOOD GAS, ARTERIAL
ACID-BASE EXCESS: 18.6 mmol/L — AB (ref 0.0–2.0)
Bicarbonate: 44.8 mEq/L — ABNORMAL HIGH (ref 20.0–24.0)
DRAWN BY: 382351
FIO2: 0.28 %
O2 SAT: 95.1 %
PH ART: 7.38 (ref 7.350–7.450)
PO2 ART: 78.6 mmHg — AB (ref 80.0–100.0)
Patient temperature: 37
TCO2: 41.1 mmol/L (ref 0–100)
pCO2 arterial: 77.5 mmHg (ref 35.0–45.0)

## 2014-11-10 MED ORDER — IPRATROPIUM-ALBUTEROL 0.5-2.5 (3) MG/3ML IN SOLN
3.0000 mL | Freq: Four times a day (QID) | RESPIRATORY_TRACT | Status: DC
  Administered 2014-11-10 – 2014-11-12 (×7): 3 mL via RESPIRATORY_TRACT
  Filled 2014-11-10 (×8): qty 3

## 2014-11-10 NOTE — Progress Notes (Signed)
[facility] PROGRESS NOTE  [name] ZOX:[id number] DOB: 04/09/1913 DOA: 11/08/2014 PCP: Pamelia HoitWILSON,FRED HENRY, MD  Assessment/Plan: 1. Acute respiratory failure with hypercapnia. Patient required to be transferred to the stepdown unit and started on BiPAP. Likely related to congestive heart failure. Her PCO2 began improving with bipap therapy. She is now on nasal cannula. ABG done this morning shows persistently elevated pCO2 with normal range pH, indicating an element of chronic respiratory failure. Continue current treatments. 2. Metabolic encephalopathy related to hypercapnia. Appears to be improving. 3. Aspiration pneumonia. Patient had vomited prior to admission. Her family reports that she often complains of difficulty eating/swallowing. We'll place her on pureed diet for now. May need swallowing evaluation. Continue intravenous antibiotics for now. 4. GERD. Continue on PPI. 5. Acute on chronic diastolic congestive heart failure. Currently on intravenous Lasix. Urine output is improving. Chest xray done 5/7 shows some improvement. Continue current treatments.. 6. Elevated troponin. Likely related to demand ischemia/troponin leak in the setting of CHF. EKG is nonacute and she is not having any chest pain. Continue to monitor. 7. Hypertension. Blood pressure is currently stable. Continue current treatments. 8. Severe protein calorie malnutrition. Nutrition consult  Code Status: DNR Family Communication: discussed with grand daughter at the bedside Disposition Plan: pending hospital course   Consultants:    Procedures:    Antibiotics:  primaxin 5/6>>  HPI/Subjective: Patient did not tolerate bipap overnight. She still feels short of breath. She is not coughing. No complaints of pain at present  Objective: Filed Vitals:   11/10/14 0829  BP:   Pulse: 69  Temp:   Resp:     Intake/Output Summary (Last 24 hours) at 11/10/14 0931 Last data filed at 11/10/14 0848  Gross per 24 hour  Intake    533 ml  Output   2400 ml  Net  -1867 ml   Filed Weights   11/08/14 2100 11/09/14 0500 11/10/14 0500  Weight: 43.2 kg (95 lb 3.8 oz) 44.1 kg (97 lb 3.6 oz) 44.5 kg (98 lb 1.7 oz)    Exam:   General:  NAD, sitting up in bed  Cardiovascular: S1, s2, rrr  Respiratory: crackles at bases, no wheezing, poor inspiratory effort  Abdomen: soft, nt, nd, bs+  Musculoskeletal: no edema b/l   Data Reviewed: Basic Metabolic Panel:  Recent Labs Lab 11/08/14 1012 11/09/14 0457 11/10/14 0427  NA 144 139 141  K 4.8 4.8 3.4*  CL 98* 95* 92*  CO2 34* 34* 43*  GLUCOSE 191* 103* 103*  BUN 19 28* 31*  CREATININE 1.48* 1.34* 0.84  CALCIUM 9.6 8.7* 8.3*   Liver Function Tests:  Recent Labs Lab 11/08/14 1012 11/09/14 0457  AST 84* 50*  ALT 44 43  ALKPHOS 90 79  BILITOT 1.1 0.9  PROT 6.8 6.3*  ALBUMIN 4.0 3.7   No results for input(s): LIPASE, AMYLASE in the last 168 hours. No results for input(s): AMMONIA in the last 168 hours. CBC:  Recent Labs Lab 11/08/14 1012 11/09/14 0457 11/10/14 0427  WBC 12.9* 12.0* 7.5  NEUTROABS 11.4*  --   --   HGB 13.2 11.8* 10.9*  HCT 42.0 37.1 33.8*  MCV 102.9* 101.6* 101.2*  PLT 153 145* 118*   Cardiac Enzymes:  Recent Labs Lab 11/08/14 1012 11/08/14 1806 11/09/14 0457  TROPONINI 0.39* 0.63* 0.59*   BNP (last 3 results)  Recent Labs  08/11/14 1530 11/08/14 1012  BNP 879.0* 1330.0*    ProBNP (last 3 results)  Recent Labs  01/02/14 1212  PROBNP 456.0*    CBG:  Recent Labs Lab 11/09/14 0720 11/09/14 1113 11/09/14 1628 11/09/14 2056 11/10/14 0717  GLUCAP 125* 112* 126* 186* 104*    Recent Results (from the past 240 hour(s))  Culture, blood (routine x 2)     Status: None (Preliminary result)   Collection Time: 11/08/14 10:26 AM  Result Value Ref Range Status   Specimen Description BLOOD RIGHT HAND  Final   Special Requests BOTTLES DRAWN AEROBIC AND ANAEROBIC 6CC  Final    Culture NO GROWTH 1 DAY  Final   Report Status PENDING  Incomplete  Culture, blood (routine x 2)     Status: None (Preliminary result)   Collection Time: 11/08/14 10:26 AM  Result Value Ref Range Status   Specimen Description RIGHT ANTECUBITAL  Final   Special Requests BOTTLES DRAWN AEROBIC AND ANAEROBIC 6CC  Final   Culture NO GROWTH 1 DAY  Final   Report Status PENDING  Incomplete  MRSA PCR Screening     Status: None   Collection Time: 11/08/14  8:36 PM  Result Value Ref Range Status   MRSA by PCR NEGATIVE NEGATIVE Final    Comment:        The GeneXpert MRSA Assay (FDA approved for NASAL specimens only), is one component of a comprehensive MRSA colonization surveillance program. It is not intended to diagnose MRSA infection nor to guide or monitor treatment for MRSA infections.      Studies: Dg Chest 1 View  11/08/2014   CLINICAL DATA:  Shortness of Breath  EXAM: CHEST  1 VIEW  COMPARISON:  September 24, 2014  FINDINGS: There is consolidation in the medial right base. There is cardiomegaly with pulmonary venous hypertension and mild interstitial edema. No adenopathy.  IMPRESSION: Evidence of a degree of congestive heart failure. Suspect superimposed pneumonia medial right base.   Electronically Signed   By: Bretta Bang III M.D.   On: 11/08/2014 10:13   Dg Chest Port 1 View  11/09/2014   CLINICAL DATA:  81 year old female with a history of cough.  EXAM: PORTABLE CHEST - 1 VIEW  COMPARISON:  Plain film 11/08/2014, 09/24/2014, CT 08/12/2014  FINDINGS: Cardiomediastinal silhouette likely unchanged though right rotation somewhat limits evaluation. Tortuosity of the thoracic aorta.  No evidence of pulmonary vascular congestion.  Improving interstitial opacities bilaterally.  Blunting of the left costophrenic angle.  Persisting interstitial/airspace opacities of the right upper lobe. Right apex not well evaluated secondary to the technique.  Atherosclerosis.  Osteopenia.  Configuration of  posttraumatic deformity of the right shoulder is unchanged from comparison plain films.  IMPRESSION: Improving interstitial/airspace opacities, persisting at the right apex, compatible with improving infection given the history.  Small left pleural effusion.  Healing fractures of lower left and right ribs with callus formation, appear to have been present on the comparison CT.  Signed,  Yvone Neu. Loreta Ave, DO  [facility]  [facility]   Electronically Signed   By: Gilmer Mor D.O.   On: 11/09/2014 09:58    Scheduled Meds: . antiseptic oral rinse  7 mL Mouth Rinse q12n4p  . carvedilol  3.125 mg Oral BID  . chlorhexidine  15 mL Mouth Rinse BID  . docusate sodium  100 mg Oral BID  . enoxaparin (LOVENOX) injection  30 mg Subcutaneous Q24H  . feeding supplement (GLUCERNA SHAKE)  237 mL Oral TID BM  . furosemide  40 mg Intravenous BID  . guaiFENesin  1,200 mg Oral BID  .  imipenem-cilastatin  250 mg Intravenous Q12H  . insulin aspart  0-5 Units Subcutaneous QHS  . insulin aspart  0-9 Units Subcutaneous TID WC  . ipratropium-albuterol  3 mL Nebulization Q6H  . loratadine  10 mg Oral Daily  . ondansetron (ZOFRAN) IV  4 mg Intravenous Q6H   Or  . ondansetron  4 mg Oral Q6H  . pantoprazole (PROTONIX) IV  40 mg Intravenous Q12H  . sodium chloride  3 mL Intravenous Q12H   Continuous Infusions:   Principal Problem:   Acute respiratory failure with hypoxia Active Problems:   HTN (hypertension)   Protein calorie malnutrition   ARF (acute renal failure)   Elevated troponin   CHF (congestive heart failure)   Aspiration pneumonia   Hyperglycemia   Hypothermia   Diastolic dysfunction with acute on chronic heart failure   Acute encephalopathy   UTI (urinary tract infection)   Congestive heart disease    Time spent: 30mins    Kynan Peasley  [facility] Pager [phone]. If 7PM-7AM, please contact night-coverage at [email or web address], password  [email or web address] 11/10/2014, 9:31 AM  LOS: 2 days

## 2014-11-10 NOTE — Progress Notes (Signed)
Patient has been off and on BIPAP all night, mostly off. ABG about to be drawn.

## 2014-11-10 NOTE — Progress Notes (Signed)
Patient woke up pulling at bipap mask and yelling she was unable to breath that she was not getting any air; bipap removed and placed in standby mode while patient was placed on 2l [place]; O2 sats 99%; no longer yelling or voicing concern about not being able to breath; will continue to monitor and document any changes in patient condition

## 2014-11-11 ENCOUNTER — Inpatient Hospital Stay (HOSPITAL_COMMUNITY): Payer: Medicare Other

## 2014-11-11 DIAGNOSIS — I482 Chronic atrial fibrillation: Secondary | ICD-10-CM

## 2014-11-11 DIAGNOSIS — I509 Heart failure, unspecified: Secondary | ICD-10-CM

## 2014-11-11 LAB — MAGNESIUM: MAGNESIUM: 1.6 mg/dL — AB (ref 1.7–2.4)

## 2014-11-11 LAB — BASIC METABOLIC PANEL
ANION GAP: 10 (ref 5–15)
BUN: 26 mg/dL — ABNORMAL HIGH (ref 6–20)
CALCIUM: 8.4 mg/dL — AB (ref 8.9–10.3)
CO2: 46 mmol/L — ABNORMAL HIGH (ref 22–32)
Chloride: 84 mmol/L — ABNORMAL LOW (ref 101–111)
Creatinine, Ser: 0.78 mg/dL (ref 0.44–1.00)
GFR calc Af Amer: >60 mL/min (ref >60)
GFR calc non Af Amer: >60 mL/min (ref >60)
GLUCOSE: 138 mg/dL — AB (ref 70–99)
POTASSIUM: 3.3 mmol/L — AB (ref 3.5–5.1)
SODIUM: 140 mmol/L (ref 135–145)

## 2014-11-11 LAB — CBC
HCT: 39.7 % (ref 36.0–46.0)
HEMOGLOBIN: 12.8 g/dL (ref 12.0–15.0)
MCH: 32 pg (ref 26.0–34.0)
MCHC: 32.2 g/dL (ref 30.0–36.0)
MCV: 99.3 fL (ref 78.0–100.0)
PLATELETS: 134 10*3/uL — AB (ref 150–400)
RBC: 4 MIL/uL (ref 3.87–5.11)
RDW: 13.6 % (ref 11.5–15.5)
WBC: 9.9 10*3/uL (ref 4.0–10.5)

## 2014-11-11 LAB — LIPASE, BLOOD: Lipase: 30 U/L (ref 22–51)

## 2014-11-11 LAB — GLUCOSE, CAPILLARY
GLUCOSE-CAPILLARY: 160 mg/dL — AB (ref 70–99)
GLUCOSE-CAPILLARY: 162 mg/dL — AB (ref 70–99)
Glucose-Capillary: 133 mg/dL — ABNORMAL HIGH (ref 70–99)
Glucose-Capillary: 194 mg/dL — ABNORMAL HIGH (ref 70–99)

## 2014-11-11 MED ORDER — PANTOPRAZOLE SODIUM 40 MG PO TBEC
40.0000 mg | DELAYED_RELEASE_TABLET | Freq: Two times a day (BID) | ORAL | Status: DC
  Administered 2014-11-11 – 2014-11-15 (×8): 40 mg via ORAL
  Filled 2014-11-11 (×8): qty 1

## 2014-11-11 MED ORDER — HYDRALAZINE HCL 20 MG/ML IJ SOLN: 5.0000 mg | INTRAMUSCULAR | Status: DC | PRN

## 2014-11-11 MED ORDER — PANTOPRAZOLE SODIUM 40 MG PO TBEC: 40.0000 mg | DELAYED_RELEASE_TABLET | Freq: Every day | ORAL | Status: DC

## 2014-11-11 MED ORDER — MAGNESIUM SULFATE 2 GM/50ML IV SOLN
2.0000 g | Freq: Once | INTRAVENOUS | Status: AC
  Administered 2014-11-11: 2 g via INTRAVENOUS
  Filled 2014-11-11: qty 50

## 2014-11-11 MED ORDER — FUROSEMIDE 40 MG PO TABS
40.0000 mg | ORAL_TABLET | Freq: Every day | ORAL | Status: DC
  Administered 2014-11-12: 40 mg via ORAL
  Filled 2014-11-11: qty 1

## 2014-11-11 MED ORDER — POTASSIUM CHLORIDE CRYS ER 20 MEQ PO TBCR
40.0000 meq | EXTENDED_RELEASE_TABLET | Freq: Once | ORAL | Status: AC
  Administered 2014-11-11: 40 meq via ORAL
  Filled 2014-11-11: qty 2

## 2014-11-11 NOTE — Progress Notes (Signed)
[facility] PROGRESS NOTE  Briana AdolphHallie M Schultz ZOX:[id number] DOB: 10/08/1912 DOA: 11/08/2014 PCP: Pamelia HoitWILSON,FRED HENRY, MD  Assessment/Plan: 1. Acute respiratory failure with hypercapnia. Continues to improve.  Likely related to congestive heart failure. Her PCO2 began improving with bipap therapy. She remains on nasal cannula. ABG 11/10/14 showed persistently elevated pCO2 with normal range pH, indicating an element of chronic respiratory failure. Will transition lasix to oral. Transfer to tele.  2. Metabolic encephalopathy related to hypercapnia. Alert and pleasantly confused. 3. Aspiration pneumonia. Patient had vomited prior to admission. Her family reported that she often complained of difficulty eating/swallowing. requesting swallowing evaluation. primaxin day #4. She is afebrile and non-toxic appearing.  Continue intravenous antibiotics for now. 4. GERD. Continue on PPI. 5. Acute on chronic diastolic congestive heart failure. Volume status -4.5L. Weight 39.9kg from 44.5kg.  Chest xray done 5/7 shows some improvement. Will transition to oral lasix. 6. Elevated troponin. Likely related to demand ischemia/troponin leak in the setting of CHF. EKG is nonacute and she remains pain free. Continue to monitor. 7. Hypertension. Blood pressure stable. Home norvasc on hold for now.  Continue home BB 8. Severe protein calorie malnutrition. BMI 21.8 Nutrition consult 9. Hypokalemia: related to diuresis. Will replete. Check mag level.   Code Status: DNR Family Communication: none present Disposition Plan: may need snf. PT eval today.hopefully discharge 24 hours   Consultants:  none  Procedures:  none  Antibiotics:  Primaxin 11/08/14>>  HPI/Subjective: Awake alert. Denies pain/discomfort.  Objective: Filed Vitals:   11/11/14 0800  BP:   Pulse:   Temp: 97.3 F (36.3 C)  Resp:     Intake/Output Summary (Last 24 hours) at 11/11/14 0903 Last data filed at 11/11/14 0100  Gross per 24 hour   Intake    340 ml  Output   3250 ml  Net  -2910 ml   Filed Weights   11/09/14 0500 11/10/14 0500 11/11/14 0500  Weight: 44.1 kg (97 lb 3.6 oz) 44.5 kg (98 lb 1.7 oz) 39.9 kg (87 lb 15.4 oz)    Exam:   General:  Thin frail appears comfortable  Cardiovascular: RRR no MGR no LE edema  Respiratory: normal effort. BS with fine crackles bilateral bases, no wheeze somewhat shallow  Abdomen: flat sofr +BS non-tender no guarding  Musculoskeletal: no clubbing or cyanosis   Data Reviewed: Basic Metabolic Panel:  Recent Labs Lab 11/08/14 1012 11/09/14 0457 11/10/14 0427 11/11/14 0551  NA 144 139 141 140  K 4.8 4.8 3.4* 3.3*  CL 98* 95* 92* 84*  CO2 34* 34* 43* 46*  GLUCOSE 191* 103* 103* 138*  BUN 19 28* 31* 26*  CREATININE 1.48* 1.34* 0.84 0.78  CALCIUM 9.6 8.7* 8.3* 8.4*   Liver Function Tests:  Recent Labs Lab 11/08/14 1012 11/09/14 0457  AST 84* 50*  ALT 44 43  ALKPHOS 90 79  BILITOT 1.1 0.9  PROT 6.8 6.3*  ALBUMIN 4.0 3.7   No results for input(s): LIPASE, AMYLASE in the last 168 hours. No results for input(s): AMMONIA in the last 168 hours. CBC:  Recent Labs Lab 11/08/14 1012 11/09/14 0457 11/10/14 0427 11/11/14 0551  WBC 12.9* 12.0* 7.5 9.9  NEUTROABS 11.4*  --   --   --   HGB 13.2 11.8* 10.9* 12.8  HCT 42.0 37.1 33.8* 39.7  MCV 102.9* 101.6* 101.2* 99.3  PLT 153 145* 118* 134*   Cardiac Enzymes:  Recent Labs Lab 11/08/14 1012 11/08/14 1806 11/09/14 0457  TROPONINI 0.39* 0.63* 0.59*   BNP (  last 3 results)  Recent Labs  08/11/14 1530 11/08/14 1012  BNP 879.0* 1330.0*    ProBNP (last 3 results)  Recent Labs  01/02/14 1212  PROBNP 456.0*    CBG:  Recent Labs Lab 11/10/14 1120 11/10/14 1440 11/10/14 1616 11/10/14 2123 11/11/14 0741  GLUCAP 176* 170* 106* 139* 133*    Recent Results (from the past 240 hour(s))  Urine culture     Status: None   Collection Time: 11/08/14 10:10 AM  Result Value Ref Range Status    Specimen Description URINE, CATHETERIZED  Final   Special Requests NONE  Final   Colony Count NO GROWTH [facility]   Final   Culture NO GROWTH Performed at [facility]   Final   Report Status 11/10/2014 FINAL  Final  Culture, blood (routine x 2)     Status: None (Preliminary result)   Collection Time: 11/08/14 10:26 AM  Result Value Ref Range Status   Specimen Description BLOOD RIGHT HAND  Final   Special Requests BOTTLES DRAWN AEROBIC AND ANAEROBIC 6CC  Final   Culture NO GROWTH 2 DAYS  Final   Report Status PENDING  Incomplete  Culture, blood (routine x 2)     Status: None (Preliminary result)   Collection Time: 11/08/14 10:26 AM  Result Value Ref Range Status   Specimen Description RIGHT ANTECUBITAL  Final   Special Requests BOTTLES DRAWN AEROBIC AND ANAEROBIC 6CC  Final   Culture NO GROWTH 2 DAYS  Final   Report Status PENDING  Incomplete  MRSA PCR Screening     Status: None   Collection Time: 11/08/14  8:36 PM  Result Value Ref Range Status   MRSA by PCR NEGATIVE NEGATIVE Final    Comment:        The GeneXpert MRSA Assay (FDA approved for NASAL specimens only), is one component of a comprehensive MRSA colonization surveillance program. It is not intended to diagnose MRSA infection nor to guide or monitor treatment for MRSA infections.      Studies: Dg Chest Port 1 View  11/09/2014   CLINICAL DATA:  81 year old female with a history of cough.  EXAM: PORTABLE CHEST - 1 VIEW  COMPARISON:  Plain film 11/08/2014, 09/24/2014, CT 08/12/2014  FINDINGS: Cardiomediastinal silhouette likely unchanged though right rotation somewhat limits evaluation. Tortuosity of the thoracic aorta.  No evidence of pulmonary vascular congestion.  Improving interstitial opacities bilaterally.  Blunting of the left costophrenic angle.  Persisting interstitial/airspace opacities of the right upper lobe. Right apex not well evaluated secondary to the technique.   Atherosclerosis.  Osteopenia.  Configuration of posttraumatic deformity of the right shoulder is unchanged from comparison plain films.  IMPRESSION: Improving interstitial/airspace opacities, persisting at the right apex, compatible with improving infection given the history.  Small left pleural effusion.  Healing fractures of lower left and right ribs with callus formation, appear to have been present on the comparison CT.  Signed,  Yvone NeuJaime S. Loreta AveWagner, DO  [facility]  [facility]   Electronically Signed   By: Gilmer MorJaime  Wagner D.O.   On: 11/09/2014 09:58    Scheduled Meds: . antiseptic oral rinse  7 mL Mouth Rinse q12n4p  . carvedilol  3.125 mg Oral BID  . chlorhexidine  15 mL Mouth Rinse BID  . docusate sodium  100 mg Oral BID  . enoxaparin (LOVENOX) injection  30 mg Subcutaneous Q24H  . feeding supplement (GLUCERNA SHAKE)  237 mL Oral TID BM  . [  START ON 11/12/2014] furosemide  40 mg Oral Daily  . guaiFENesin  1,200 mg Oral BID  . imipenem-cilastatin  250 mg Intravenous Q12H  . insulin aspart  0-5 Units Subcutaneous QHS  . insulin aspart  0-9 Units Subcutaneous TID WC  . ipratropium-albuterol  3 mL Nebulization Q6H  . loratadine  10 mg Oral Daily  . ondansetron (ZOFRAN) IV  4 mg Intravenous Q6H   Or  . ondansetron  4 mg Oral Q6H  . pantoprazole (PROTONIX) IV  40 mg Intravenous Q12H  . potassium chloride  40 mEq Oral Once  . sodium chloride  3 mL Intravenous Q12H   Continuous Infusions:   Principal Problem:   Acute respiratory failure with hypoxia Active Problems:   HTN (hypertension)   Protein calorie malnutrition   ARF (acute renal failure)   Elevated troponin   CHF (congestive heart failure)   Aspiration pneumonia   Hyperglycemia   Hypothermia   Diastolic dysfunction with acute on chronic heart failure   Acute encephalopathy   UTI (urinary tract infection)   Congestive heart disease    Time spent: 30 minutes    [facility]  M  [facility] Pager [phone]. If 7PM-7AM, please contact night-coverage at [email or web address], password [facility] 11/11/2014, 9:03 AM  LOS: 3 days

## 2014-11-11 NOTE — Progress Notes (Signed)
Called report to Briana Lotaylor James, RN on dept 300.  Verbalized understanding.  Pt transferred to room 341 in safe and stable condition. Schonewitz, [name] 11/11/2014

## 2014-11-11 NOTE — Progress Notes (Signed)
PT Cancellation Note  Patient Details Name: Dow AdolphHallie M Cronic MRN: [id number] DOB: 12/09/1912   Cancelled Treatment:    Reason Eval/Treat Not Completed: Other (comment).  Pt very nauseated and unable to tolerate a PT eval.  Will try again tomorrow.   Myrlene BrokerBrown, Derrion Tritz L 11/11/2014, 4:16 PM

## 2014-11-11 NOTE — Care Management Note (Signed)
Case Management Note  Patient Details  Name: Briana Schultz MRN: [id number] Date of Birth: 09/06/1912   Expected Discharge Date:                  Expected Discharge Plan:  Home/Self Care  In-House Referral:  NA  Discharge planning Services  CM Consult  Post Acute Care Choice:  Resumption of Svcs/PTA Provider Choice offered to:     DME Arranged:    DME Agency:     HH Arranged:    HH Agency:     Status of Service:  In process, will continue to follow  Medicare Important Message Given:    Date Medicare IM Given:    Medicare IM give by:    Date Additional Medicare IM Given:    Additional Medicare Important Message give by:     If discussed at Long Length of Stay Meetings, dates discussed:    Additional Comments: Pt is from home, lives with daughter and was recently discharged from SNF. [facility]. [facility] is aware of admission. Per daughter pt has all DME'S that she needs at home, including walker, wheelchair, BSC, elevated toilet seat, etc. Was in the process of getting home O2 prior to admission. Will need home O2 assessment prior to DC. Pt will need PT eval prior to DC. At this time DC plan is to return home with daughter and [facility]. Will cont to follow.  Malcolm Metrohildress, Sueo Cullen Demske, RN 11/11/2014, 11:18 AM

## 2014-11-12 ENCOUNTER — Ambulatory Visit: Payer: Medicare Other | Admitting: Orthopedic Surgery

## 2014-11-12 DIAGNOSIS — R112 Nausea with vomiting, unspecified: Secondary | ICD-10-CM

## 2014-11-12 LAB — BASIC METABOLIC PANEL
Anion gap: 8 (ref 5–15)
BUN: 30 mg/dL — ABNORMAL HIGH (ref 6–20)
CO2: 49 mmol/L — ABNORMAL HIGH (ref 22–32)
CREATININE: 0.79 mg/dL (ref 0.44–1.00)
Calcium: 9.2 mg/dL (ref 8.9–10.3)
Chloride: 84 mmol/L — ABNORMAL LOW (ref 101–111)
GFR calc non Af Amer: >60 mL/min (ref >60)
Glucose, Bld: 143 mg/dL — ABNORMAL HIGH (ref 70–99)
Potassium: 3.4 mmol/L — ABNORMAL LOW (ref 3.5–5.1)
Sodium: 141 mmol/L (ref 135–145)

## 2014-11-12 LAB — GLUCOSE, CAPILLARY
GLUCOSE-CAPILLARY: 137 mg/dL — AB (ref 70–99)
GLUCOSE-CAPILLARY: 116 mg/dL — AB (ref 70–99)
Glucose-Capillary: 153 mg/dL — ABNORMAL HIGH (ref 70–99)
Glucose-Capillary: 126 mg/dL — ABNORMAL HIGH (ref 70–99)

## 2014-11-12 MED ORDER — METOCLOPRAMIDE HCL 10 MG PO TABS
10.0000 mg | ORAL_TABLET | Freq: Three times a day (TID) | ORAL | Status: DC
  Administered 2014-11-12: 10 mg via ORAL
  Filled 2014-11-12: qty 1

## 2014-11-12 MED ORDER — METOCLOPRAMIDE HCL 5 MG/ML IJ SOLN
5.0000 mg | Freq: Four times a day (QID) | INTRAMUSCULAR | Status: DC
  Administered 2014-11-12 – 2014-11-13 (×3): 5 mg via INTRAVENOUS
  Filled 2014-11-12 (×3): qty 2

## 2014-11-12 MED ORDER — FUROSEMIDE 20 MG PO TABS
20.0000 mg | ORAL_TABLET | Freq: Every day | ORAL | Status: DC
  Administered 2014-11-13 – 2014-11-15 (×3): 20 mg via ORAL
  Filled 2014-11-12 (×3): qty 1

## 2014-11-12 MED ORDER — POTASSIUM CHLORIDE CRYS ER 20 MEQ PO TBCR
40.0000 meq | EXTENDED_RELEASE_TABLET | Freq: Once | ORAL | Status: AC
  Administered 2014-11-12: 40 meq via ORAL
  Filled 2014-11-12: qty 2

## 2014-11-12 MED ORDER — ALPRAZOLAM 0.25 MG PO TABS
0.2500 mg | ORAL_TABLET | Freq: Two times a day (BID) | ORAL | Status: DC | PRN
  Administered 2014-11-15: 0.25 mg via ORAL
  Filled 2014-11-12: qty 1

## 2014-11-12 NOTE — Progress Notes (Signed)
PT Cancellation Note  Patient Details Name: Dow AdolphHallie M Hoxworth MRN: [id number] DOB: 06/18/1913   Cancelled Treatment:    Reason Eval/Treat Not Completed: Patient's level of consciousness.  Pt had been sedated earlier due to agitation.  She is too drowsy to awaken for PT eval.  Will try again tomorrow.   Konrad PentaBrown, Meera Vasco L 11/12/2014, 3:21 PM

## 2014-11-12 NOTE — Evaluation (Signed)
Clinical/Bedside Swallow Evaluation Patient Details  Name: Briana Schultz MRN: [id number] Date of Birth: 10/17/1912  Today's Date: 11/12/2014 Time: SLP Start Time (ACUTE ONLY): 1200 SLP Stop Time (ACUTE ONLY): 1228 SLP Time Calculation (min) (ACUTE ONLY): 28 min  Past Medical History:  Past Medical History  Diagnosis Date  . Fall     secondary to weakness as of  [place] [facility]  03/26/10  . H/O orthostatic hypotension   . Chest discomfort     anterior  . Hypertension   . Aortic valve stenosis     mild to moderate  . Syncope and collapse 2006  . Arthritis   . GERD (gastroesophageal reflux disease)   . Osteoporosis   . Abnormal EKG   . Scarlet fever   . Scoliosis   . TIA (transient ischemic attack) 1999  . Fracture, humerus     2016  . A-fib     02/2014, not on anticoagulation   . Diastolic CHF 01/30/2014  . Proximal humerus fracture   . On home O2 08/2014    2L N/C  . Anemia   . Thrombocytopenia   . DNR (do not resuscitate) 08/2014  . Malnutrition   . Frequent falls   . Mild dementia    Past Surgical History:  Past Surgical History  Procedure Laterality Date  . Cholecystectomy    . Cataract extraction Bilateral   . Femur im nail Right 12/17/2013    Procedure: INTRAMEDULLARY (IM) NAIL FEMORAL;  Surgeon: Sheral Apleyimothy D Murphy, MD;  Location: [facility] OR;  Service: Orthopedics;  Laterality: Right;   HPI:  Briana Schultz is a 81 y.o. female with past medical history of chronic diastolic heart failure, A. fib, hypertension, hypoxia, anemia, recent right hip fracture she's been home from the facility approximately 5 weeks presents to the emergency department with chief complaint of acute encephalopathy recent emesis generalized weakness. Initial evaluation in the emergency department includes chest x-ray concerning for aspiration pneumonia, acute renal failure, leukocytosis, hypoxia. SLP asked to evaluate swallow due to family reports of pt with c/o difficulty swallowing and possible  aspiration PNA.    Assessment / Plan / Recommendation Clinical Impression  Briana Schultz was seen at bedside with family in room. Her daughter with whom the patient lives with reported that pt typically does not have difficulty swallowing. She wears her U/L dentures for all po intake. Pt recently started to complain of nausea and reportedley emesis (however daughter states it seemed to be "frothy phlegm") and globus sensation (points to pharynx). [facility] and she is in fact quite spry at 81 years of age. She didn't want to eat any of her lunch, but was willing to try small amounts for the purpose of the evaluation. Pt spontaneously took very small sips, which daughter states is her norm. She showed no overt signs or symptoms of aspiration with thin, Magic Cup, or puree.  I suspect that she may be experiencing esophageal dysphagia (delayed emptying) which can explain globus sensation and possibly the nausea. Pt has curvature of her spine which also likely impacts esophageal clearance of bolus. It is unclear as to whether pt was truly vomiting or experiencing regurgitation. Trials of lunch were limited and I encouraged her daughter to offer small amounts liquid and Magic Cup throughout the day. Continue diet as ordered (puree and thin) and SLP will follow for diet tolerance, upgrades, and pt/family education as appropriate. Above discussed with Dr. Kerry HoughMemon.    Aspiration Risk  Mild  Diet Recommendation Dysphagia 1 (Puree);Thin   Medication Administration: Crushed with puree Compensations: Slow rate;Small sips/bites;Multiple dry swallows after each bite/sip    Other  Recommendations Oral Care Recommendations: Oral care BID Other Recommendations: Clarify dietary restrictions   Follow Up Recommendations       Frequency and Duration    2 weeks   Pertinent Vitals/Pain VSS    SLP Swallow Goals   Pt will demonstrate safe and efficient consumption of least restrictive diet with use  of strategies as needed.    Swallow Study Prior Functional Status   Lives with her daughter    General Date of Onset: 11/08/14 Other Pertinent Information: Briana Schultz is a 81 y.o. female with past medical history of chronic diastolic heart failure, A. fib, hypertension, hypoxia, anemia, recent right hip fracture she's been home from the facility approximately 5 weeks presents to the emergency department with chief complaint of acute encephalopathy recent emesis generalized weakness. Initial evaluation in the emergency department includes chest x-ray concerning for aspiration pneumonia, acute renal failure, leukocytosis, hypoxia. SLP asked to evaluate swallow due to family reports of pt with c/o difficulty swallowing and possible aspiration PNA.  Type of Study: Bedside swallow evaluation Diet Prior to this Study: Thin liquids;Dysphagia 1 (puree) Temperature Spikes Noted: No Respiratory Status: Supplemental O2 delivered via (comment) History of Recent Intubation: No Behavior/Cognition: Alert;Cooperative;Pleasant mood;Requires cueing Oral Cavity - Dentition: Edentulous (has U/L dentures) Self-Feeding Abilities: Able to feed self;Needs set up Patient Positioning: Upright in bed Baseline Vocal Quality: Normal Volitional Cough: Weak Volitional Swallow: Able to elicit    Oral/Motor/Sensory Function Overall Oral Motor/Sensory Function: Appears within functional limits for tasks assessed Labial ROM: Within Functional Limits Labial Symmetry: Within Functional Limits Labial Strength: Within Functional Limits Labial Sensation: Within Functional Limits Lingual ROM: Within Functional Limits Lingual Symmetry: Within Functional Limits Lingual Strength: Within Functional Limits Lingual Sensation: Within Functional Limits Facial ROM: Within Functional Limits Facial Symmetry: Within Functional Limits Facial Strength: Within Functional Limits Facial Sensation: Within Functional Limits Velum:  Within Functional Limits Mandible: Within Functional Limits   Ice Chips Ice chips: Within functional limits Presentation: Spoon   Thin Liquid Thin Liquid: Within functional limits Presentation: Cup;Straw    Nectar Thick Nectar Thick Liquid: Not tested   Honey Thick Honey Thick Liquid: Within functional limits [employer](Magic Cup) Presentation: Spoon   Puree Puree: Within functional limits Presentation: Spoon Other Comments: Pt agreeable to only very small amounts   Solid   Thank you,  Havery MorosDabney Tiea Manninen, CCC-SLP [phone]     Solid: Not tested       Kenzel Ruesch 11/12/2014,6:35 PM

## 2014-11-12 NOTE — Progress Notes (Signed)
Patients foley catheter was removed per NP order. Patient tolerated removal well. Patient is expected to void by 0245 on 11/13/2014. Will continue to monitor patient at this time.

## 2014-11-12 NOTE — Progress Notes (Signed)
ANTIBIOTIC CONSULT NOTE  Pharmacy Consult for Primaxin Indication: pneumonia  Allergies  Allergen Reactions  . Amoxicillin Other (See Comments)    unknown  . Doxycycline Other (See Comments)    unknown  . Fosamax [Alendronate] Other (See Comments)    unknown  . Septra [Sulfamethoxazole-Trimethoprim] Other (See Comments)    unknown  . Tramadol     disoriented    Patient Measurements: Height: 4\' 8"  (142.2 cm) Weight: 85 lb 8.6 oz (38.8 kg) IBW/kg (Calculated) : 36.3  Vital Signs: Temp: 97.4 F (36.3 C) (05/10 0453) Temp Source: Oral (05/10 0453) BP: 157/42 mmHg (05/10 0453) Pulse Rate: 86 (05/10 0911) Intake/Output from previous day: 05/09 0701 - 05/10 0700 In: 270 [P.O.:120; IV Piggyback:150] Out: 1400 [Urine:1400] Intake/Output from this shift: Total I/O In: 3 [I.V.:3] Out: -   Labs:  Recent Labs  11/10/14 0427 11/11/14 0551 11/12/14 0616  WBC 7.5 9.9  --   HGB 10.9* 12.8  --   PLT 118* 134*  --   CREATININE 0.84 0.78 0.79   Estimated Creatinine Clearance: 22 mL/min (by C-G formula based on Cr of 0.79). No results for input(s): VANCOTROUGH, VANCOPEAK, VANCORANDOM, GENTTROUGH, GENTPEAK, GENTRANDOM, TOBRATROUGH, TOBRAPEAK, TOBRARND, AMIKACINPEAK, AMIKACINTROU, AMIKACIN in the last 72 hours.   Microbiology: Recent Results (from the past 720 hour(s))  Urine culture     Status: None   Collection Time: 11/08/14 10:10 AM  Result Value Ref Range Status   Specimen Description URINE, CATHETERIZED  Final   Special Requests NONE  Final   Colony Count NO GROWTH [facility]formed at [facility]   Final   Culture NO GROWTH [facility]formed at [facility]   Final   Report Status 11/10/2014 FINAL  Final  Culture, blood (routine x 2)     Status: None (Preliminary result)   Collection Time: 11/08/14 10:26 AM  Result Value Ref Range Status   Specimen Description BLOOD RIGHT HAND  Final   Special Requests BOTTLES DRAWN AEROBIC AND ANAEROBIC 6CC  Final   Culture  NO GROWTH 4 DAYS  Final   Report Status PENDING  Incomplete  Culture, blood (routine x 2)     Status: None (Preliminary result)   Collection Time: 11/08/14 10:26 AM  Result Value Ref Range Status   Specimen Description RIGHT ANTECUBITAL  Final   Special Requests BOTTLES DRAWN AEROBIC AND ANAEROBIC 6CC  Final   Culture NO GROWTH 4 DAYS  Final   Report Status PENDING  Incomplete  MRSA PCR Screening     Status: None   Collection Time: 11/08/14  8:36 PM  Result Value Ref Range Status   MRSA by PCR NEGATIVE NEGATIVE Final    Comment:        The GeneXpert MRSA Assay (FDA approved for NASAL specimens only), is one component of a comprehensive MRSA colonization surveillance program. It is not intended to diagnose MRSA infection nor to guide or monitor treatment for MRSA infections.     Anti-infectives    Start     Dose/Rate Route Frequency Ordered Stop   11/09/14 0100  imipenem-cilastatin (PRIMAXIN) 250 mg in sodium chloride 0.9 % 100 mL IVPB     250 mg 200 mL/hr over 30 Minutes Intravenous Every 12 hours 11/08/14 1248     11/08/14 1230  imipenem-cilastatin (PRIMAXIN) 250 mg in sodium chloride 0.9 % 100 mL IVPB     250 mg 200 mL/hr over 30 Minutes Intravenous  Once 11/08/14 1216 11/08/14 1300      Assessment: 81  yo F who presented with vomiting & low O2 sats.   CXR + superimposed PNA.  Concern for aspiration PNA vs HCAP (she was hospitalized/nursing facility stay <60 days ago). She is afebrile, but WBC and lactic acid elevated on admission.  Scr elevated- estimated CrCl ~ 10-7415ml/min.  Multiple antibiotic drug allergies noted.  No seizure history.  Levaquin per pharmacy initially ordered, however discussed with Dr Blinda LeatherwoodPollina since this will not provide anaerobic coverage.  Changed to Primaxin per pharmacy dosing for aspiration & HCAP coverage.   Goal of Therapy:  Eradicate infection.  Plan:  Primaxin 250mg  IV q12h Monitor renal function and cx data  Duration of therapy per MD-  de-escalate as clinically appropriate  Valrie HartHall, Layton Naves A 11/12/2014,11:51 AM

## 2014-11-12 NOTE — Progress Notes (Signed)
[facility] PROGRESS NOTE  Dow AdolphHallie M Chilcote ZOX:[id number] DOB: 02/20/1913 DOA: 11/08/2014 PCP: Pamelia HoitWILSON,FRED HENRY, MD  Assessment/Plan: 1. Acute respiratory failure with hypercapnia.  Likely related to congestive heart failure. Her PCO2 began improving with bipap therapy. She remains on nasal cannula. ABG 11/10/14 showed persistently elevated pCO2 with normal range pH, indicating an element of chronic respiratory failure. sats >90% on 2L. Will wean oxygen as able  2. Metabolic encephalopathy related to hypercapnia. remains alert and pleasantly confused. 3. Aspiration pneumonia. Patient had vomited prior to admission. Her family reported that she often complained of difficulty eating/swallowing. requesting swallowing evaluation. primaxin. She remains afebrile and non-toxic appearing.will narrow antibiotics 4. GERD. Continue on PPI. 5. Acute on chronic diastolic congestive heart failure. Volume status -5.6L. Weight 38.8kg from 44.5kg. Chest xray done 5/7 shows some improvement. 6. Elevated troponin. Likely related to demand ischemia/troponin leak in the setting of CHF. EKG is nonacute and she remains pain free. Continue to monitor. 7. Hypertension. Blood pressure stable. Home norvasc on hold for now. Continue home BB 8. Severe protein calorie malnutrition. BMI 21.8 Nutrition consult 9. Hypokalemia: related to diuresis. Will replete. Check mag level. 10. Persistent nausea: associated with swallowing and eating. Will start reglan. Await swallow eval as some concern aspiration.    Code Status: DNR Family Communication:  Disposition Plan: await PT evaluation   Consultants:  none  Procedures: none    Antibiotics:  Primaxin 11/08/14>>  HPI/Subjective: Awake alert denies pain. Pleasantly confused  Objective: Filed Vitals:   11/12/14 1450  BP: 126/41  Pulse: 62  Temp: 97.7 F (36.5 C)  Resp: 18    Intake/Output Summary (Last 24 hours) at 11/12/14 1504 Last data filed at  11/12/14 1450  Gross per 24 hour  Intake    343 ml  Output      0 ml  Net    343 ml   Filed Weights   11/10/14 0500 11/11/14 0500 11/12/14 0426  Weight: 44.5 kg (98 lb 1.7 oz) 39.9 kg (87 lb 15.4 oz) 38.8 kg (85 lb 8.6 oz)    Exam:   General:  Thin frail appears comfortable  Cardiovascular: S1 and S2. No m/g/r no LE edema  Respiratory: normal effort BS clear no wheeze  Abdomen: non-distended +BS   Musculoskeletal: joints without swelling erythma  Data Reviewed: Basic Metabolic Panel:  Recent Labs Lab 11/08/14 1012 11/09/14 0457 11/10/14 0427 11/11/14 0551 11/12/14 0616  NA 144 139 141 140 141  K 4.8 4.8 3.4* 3.3* 3.4*  CL 98* 95* 92* 84* 84*  CO2 34* 34* 43* 46* 49*  GLUCOSE 191* 103* 103* 138* 143*  BUN 19 28* 31* 26* 30*  CREATININE 1.48* 1.34* 0.84 0.78 0.79  CALCIUM 9.6 8.7* 8.3* 8.4* 9.2  MG  --   --   --  1.6*  --    Liver Function Tests:  Recent Labs Lab 11/08/14 1012 11/09/14 0457  AST 84* 50*  ALT 44 43  ALKPHOS 90 79  BILITOT 1.1 0.9  PROT 6.8 6.3*  ALBUMIN 4.0 3.7    Recent Labs Lab 11/11/14 0551  LIPASE 30   No results for input(s): AMMONIA in the last 168 hours. CBC:  Recent Labs Lab 11/08/14 1012 11/09/14 0457 11/10/14 0427 11/11/14 0551  WBC 12.9* 12.0* 7.5 9.9  NEUTROABS 11.4*  --   --   --   HGB 13.2 11.8* 10.9* 12.8  HCT 42.0 37.1 33.8* 39.7  MCV 102.9* 101.6* 101.2* 99.3  PLT 153 145* 118*  134*   Cardiac Enzymes:  Recent Labs Lab 11/08/14 1012 11/08/14 1806 11/09/14 0457  TROPONINI 0.39* 0.63* 0.59*   BNP (last 3 results)  Recent Labs  08/11/14 1530 11/08/14 1012  BNP 879.0* 1330.0*    ProBNP (last 3 results)  Recent Labs  01/02/14 1212  PROBNP 456.0*    CBG:  Recent Labs Lab 11/11/14 1109 11/11/14 1640 11/11/14 2032 11/12/14 0753 11/12/14 1125  GLUCAP 160* 194* 162* 137* 116*    Recent Results (from the past 240 hour(s))  Urine culture     Status: None   Collection Time: 11/08/14  10:10 AM  Result Value Ref Range Status   Specimen Description URINE, CATHETERIZED  Final   Special Requests NONE  Final   Colony Count NO GROWTH Performed at [facility]   Final   Culture NO GROWTH Performed at [facility]   Final   Report Status 11/10/2014 FINAL  Final  Culture, blood (routine x 2)     Status: None (Preliminary result)   Collection Time: 11/08/14 10:26 AM  Result Value Ref Range Status   Specimen Description BLOOD RIGHT HAND  Final   Special Requests BOTTLES DRAWN AEROBIC AND ANAEROBIC 6CC  Final   Culture NO GROWTH 4 DAYS  Final   Report Status PENDING  Incomplete  Culture, blood (routine x 2)     Status: None (Preliminary result)   Collection Time: 11/08/14 10:26 AM  Result Value Ref Range Status   Specimen Description RIGHT ANTECUBITAL  Final   Special Requests BOTTLES DRAWN AEROBIC AND ANAEROBIC 6CC  Final   Culture NO GROWTH 4 DAYS  Final   Report Status PENDING  Incomplete  MRSA PCR Screening     Status: None   Collection Time: 11/08/14  8:36 PM  Result Value Ref Range Status   MRSA by PCR NEGATIVE NEGATIVE Final    Comment:        The GeneXpert MRSA Assay (FDA approved for NASAL specimens only), is one component of a comprehensive MRSA colonization surveillance program. It is not intended to diagnose MRSA infection nor to guide or monitor treatment for MRSA infections.      Studies: Dg Chest Port 1 View  11/11/2014   CLINICAL DATA:  History of CHF. Patient is lethargic with nausea vomiting.  EXAM: PORTABLE CHEST - 1 VIEW  COMPARISON:  11/09/2014  FINDINGS: Moderate to marked enlargement of the cardiopericardial silhouette. The aorta is uncoiled. No gross mediastinal or hilar masses. Prominent bronchovascular interstitial markings are noted in the lungs, stable. No lung consolidation or convincing pulmonary edema. No pneumothorax. Bony thorax is demineralized. Old right proximal humeral fracture is stable.  IMPRESSION: No acute  cardiopulmonary disease.   Electronically Signed   By: Amie Portlandavid  Ormond M.D.   On: 11/11/2014 11:43   Dg Abd Portable 1v  11/11/2014   CLINICAL DATA:  Hx chf, lethargic, nausea,vomiting  EXAM: PORTABLE ABDOMEN - 1 VIEW  COMPARISON:  None.  FINDINGS: Normal bowel gas pattern. There are surgical clips are upper quadrant from a prior cholecystectomy. Vascular calcifications are noted along the abdominal aorta and the iliac vessels. Soft tissues are otherwise unremarkable.  Previous right proximal femur fracture has been reduced with an intra medullary rod and compression screw. Skeletal structures are diffusely demineralized.  IMPRESSION: 1. No acute finding.  No evidence of bowel obstruction.   Electronically Signed   By: Amie Portlandavid  Ormond M.D.   On: 11/11/2014 11:45    Scheduled Meds: . antiseptic oral  rinse  7 mL Mouth Rinse q12n4p  . carvedilol  3.125 mg Oral BID  . chlorhexidine  15 mL Mouth Rinse BID  . docusate sodium  100 mg Oral BID  . enoxaparin (LOVENOX) injection  30 mg Subcutaneous Q24H  . feeding supplement (GLUCERNA SHAKE)  237 mL Oral TID BM  . furosemide  40 mg Oral Daily  . guaiFENesin  1,200 mg Oral BID  . imipenem-cilastatin  250 mg Intravenous Q12H  . insulin aspart  0-5 Units Subcutaneous QHS  . insulin aspart  0-9 Units Subcutaneous TID WC  . ipratropium-albuterol  3 mL Nebulization Q6H  . loratadine  10 mg Oral Daily  . metoCLOPramide  10 mg Oral TID AC & HS  . ondansetron (ZOFRAN) IV  4 mg Intravenous Q6H   Or  . ondansetron  4 mg Oral Q6H  . pantoprazole  40 mg Oral BID  . potassium chloride  40 mEq Oral Once  . sodium chloride  3 mL Intravenous Q12H   Continuous Infusions:   Principal Problem:   Acute respiratory failure with hypoxia Active Problems:   HTN (hypertension)   Protein calorie malnutrition   ARF (acute renal failure)   Elevated troponin   CHF (congestive heart failure)   Aspiration pneumonia   Hyperglycemia   Hypothermia   Diastolic dysfunction  with acute on chronic heart failure   Acute encephalopathy   UTI (urinary tract infection)   Congestive heart disease    Time spent: 20 minutes    [facility] M  [facility] Pager [phone]. If 7PM-7AM, please contact night-coverage at [email or web address], password [place] Day 11/12/2014, 3:04 PM  LOS: 4 days

## 2014-11-13 DIAGNOSIS — E46 Unspecified protein-calorie malnutrition: Secondary | ICD-10-CM

## 2014-11-13 LAB — BASIC METABOLIC PANEL
Anion gap: 7 (ref 5–15)
BUN: 36 mg/dL — AB (ref 6–20)
CALCIUM: 9.3 mg/dL (ref 8.9–10.3)
CO2: 48 mmol/L — ABNORMAL HIGH (ref 22–32)
Chloride: 86 mmol/L — ABNORMAL LOW (ref 101–111)
Creatinine, Ser: 0.78 mg/dL (ref 0.44–1.00)
GFR calc Af Amer: >60 mL/min (ref >60)
GFR calc non Af Amer: >60 mL/min (ref >60)
Glucose, Bld: 177 mg/dL — ABNORMAL HIGH (ref 70–99)
Potassium: 3.7 mmol/L (ref 3.5–5.1)
SODIUM: 141 mmol/L (ref 135–145)

## 2014-11-13 LAB — GLUCOSE, CAPILLARY
GLUCOSE-CAPILLARY: 109 mg/dL — AB (ref 70–99)
Glucose-Capillary: 240 mg/dL — ABNORMAL HIGH (ref 70–99)
Glucose-Capillary: 123 mg/dL — ABNORMAL HIGH (ref 70–99)
Glucose-Capillary: 176 mg/dL — ABNORMAL HIGH (ref 70–99)

## 2014-11-13 LAB — CULTURE, BLOOD (ROUTINE X 2)
Culture: NO GROWTH
Culture: NO GROWTH

## 2014-11-13 MED ORDER — IPRATROPIUM-ALBUTEROL 0.5-2.5 (3) MG/3ML IN SOLN
3.0000 mL | Freq: Four times a day (QID) | RESPIRATORY_TRACT | Status: DC | PRN
  Administered 2014-11-13: 3 mL via RESPIRATORY_TRACT

## 2014-11-13 MED ORDER — DOCUSATE SODIUM 100 MG PO CAPS
100.0000 mg | ORAL_CAPSULE | Freq: Two times a day (BID) | ORAL | Status: DC
  Administered 2014-11-13 – 2014-11-15 (×5): 100 mg via ORAL
  Filled 2014-11-13 (×5): qty 1

## 2014-11-13 MED ORDER — BISACODYL 10 MG RE SUPP
10.0000 mg | Freq: Once | RECTAL | Status: AC
  Administered 2014-11-13: 10 mg via RECTAL
  Filled 2014-11-13: qty 1

## 2014-11-13 MED ORDER — LACTULOSE 10 GM/15ML PO SOLN
20.0000 g | Freq: Once | ORAL | Status: AC
  Administered 2014-11-13: 20 g via ORAL
  Filled 2014-11-13: qty 30

## 2014-11-13 NOTE — Progress Notes (Signed)
Patient had large bowel movement after receiving lactulose and suppository, was impacted and required some assistance with removal.  Patient still complains of nausea.  Drank entire glucerna this morning, but only take a few bites at meals.  MD paged and made aware.

## 2014-11-13 NOTE — Progress Notes (Signed)
[facility] PROGRESS NOTE  Dow AdolphHallie M Sinopoli ZOX:[id number] DOB: 11/23/1912 DOA: 11/08/2014 PCP: Pamelia HoitWILSON,FRED HENRY, MD  Assessment/Plan: 1. Acute respiratory failure with hypercapnia -Evidence by initial ABG showing a PCO2 of 92.9 with pH of 7.219 and PO2 of 62.8 -Likely secondary to acute decompensated congestive heart failure. -Improved with IV diuresis  2.  Acute encephalopathy -Suspect hypercarbia contributing to her encephalopathy -Improved, appears to be functioning at her baseline level.  3.  Possible aspiration pneumonia. -Family reporting aspiration event at home as she presented with acute respiratory failure. -Repeat chest x-ray performed on 11/11/2014 showed no acute cardiopulmonary disease -patient stable respiratory status -She has well evaluation done during this hospitalization for which speech pathology felt she was experiencing esophageal dysphagia, recommended dysphasia 1 diet.  4.  Constipation -Patient not having a bowel movement for 5 days, was given Dulcolax suppository and lactulose. She required disimpaction. After these interventions had good results. I wonder if constipation may be contributing to nausea  5.  Dysphasia. -Patient seen and evaluated by speech pathology, found to have esophageal dysphagia which could explain nausea. -SLP recommending dysphasia 1 diet  6. Severe protein calorie malnutrition. -Nutrition consult, continue protein boost.  7.  Nausea -Patient reporting nausea not associate with vomiting for the past several days. I suspect multifactorial with esophageal dysphasia, functional decline, constipation all likely contributors.  -Continue as needed IV antiemetic therapy, address constipation, on dysphagia 1 diet    Code Status: DO NOT RESUSCITATE Family Communication: I spoke with her daughter over telephone conversation Disposition Plan:    Consultants:  Physical therapy  Speech pathology    HPI/Subjective: Patient is  a 81 year old female with a past medical history of diastolic congestive heart failure, atrial fibrillation, hypertension, admitted to the medicine service on 11/08/2014 when she presented with functional decline, failure to thrive, shortness of breath, encephalopathy. Initial lab work revealed a BNP of 1330, with chest x-ray showing evidence of CHF and question superimposed pneumonia. Urinalysis was negative for leukocytes and nitrates. Acute respiratory failure with hypercapnia likely secondary to acute decompensated congestive heart failure. She was initially placed on BiPAP and started on IV Lasix. She subsequently showed clinical improvement and was later transitioned to oral Lasix. With guard to the possibility of pneumonia family members had reported episode of nausea vomiting at home for which aspiration ammonia was suspected. She was treated with empiric IV antibiotic therapy with Primaxin. During this hospitalization she complained of persistent nausea. Abdominal x-ray was unremarkable. She reported ongoing nausea despite receiving IV antibiotic therapy. Patient not having a bowel movement in 4-5 days, it was felt constipation could be contributing to her nausea. She was placed on bowel regimen with good results on 11/13/2014.  Objective: Filed Vitals:   11/13/14 0647  BP: 164/60  Pulse: 77  Temp: 98.7 F (37.1 C)  Resp: 17    Intake/Output Summary (Last 24 hours) at 11/13/14 1407 Last data filed at 11/13/14 1200  Gross per 24 hour  Intake    452 ml  Output    801 ml  Net   -349 ml   Filed Weights   11/11/14 0500 11/12/14 0426 11/13/14 0500  Weight: 39.9 kg (87 lb 15.4 oz) 38.8 kg (85 lb 8.6 oz) 38.42 kg (84 lb 11.2 oz)    Exam:   General:  Patient complains of ongoing nausea, although no acute distress awake and alert can follow simple commands  Cardiovascular: Regular rate and rhythm normal S1-S2 no extremity edema  Respiratory: Normal respiratory effort,  lungs were clear  to auscultation bilaterally  Abdomen: Soft nontender nondistended  Musculoskeletal: No edema   Data Reviewed: Basic Metabolic Panel:  Recent Labs Lab 11/09/14 0457 11/10/14 0427 11/11/14 0551 11/12/14 0616 11/13/14 0617  NA 139 141 140 141 141  K 4.8 3.4* 3.3* 3.4* 3.7  CL 95* 92* 84* 84* 86*  CO2 34* 43* 46* 49* 48*  GLUCOSE 103* 103* 138* 143* 177*  BUN 28* 31* 26* 30* 36*  CREATININE 1.34* 0.84 0.78 0.79 0.78  CALCIUM 8.7* 8.3* 8.4* 9.2 9.3  MG  --   --  1.6*  --   --    Liver Function Tests:  Recent Labs Lab 11/08/14 1012 11/09/14 0457  AST 84* 50*  ALT 44 43  ALKPHOS 90 79  BILITOT 1.1 0.9  PROT 6.8 6.3*  ALBUMIN 4.0 3.7    Recent Labs Lab 11/11/14 0551  LIPASE 30   No results for input(s): AMMONIA in the last 168 hours. CBC:  Recent Labs Lab 11/08/14 1012 11/09/14 0457 11/10/14 0427 11/11/14 0551  WBC 12.9* 12.0* 7.5 9.9  NEUTROABS 11.4*  --   --   --   HGB 13.2 11.8* 10.9* 12.8  HCT 42.0 37.1 33.8* 39.7  MCV 102.9* 101.6* 101.2* 99.3  PLT 153 145* 118* 134*   Cardiac Enzymes:  Recent Labs Lab 11/08/14 1012 11/08/14 1806 11/09/14 0457  TROPONINI 0.39* 0.63* 0.59*   BNP (last 3 results)  Recent Labs  08/11/14 1530 11/08/14 1012  BNP 879.0* 1330.0*    ProBNP (last 3 results)  Recent Labs  01/02/14 1212  PROBNP 456.0*    CBG:  Recent Labs Lab 11/12/14 1125 11/12/14 1622 11/12/14 2020 11/13/14 0727 11/13/14 1125  GLUCAP 116* 153* 126* 240* 123*    Recent Results (from the past 240 hour(s))  Urine culture     Status: None   Collection Time: 11/08/14 10:10 AM  Result Value Ref Range Status   Specimen Description URINE, CATHETERIZED  Final   Special Requests NONE  Final   Colony Count NO GROWTH Performed at [facility]   Final   Culture NO GROWTH Performed at [facility]   Final   Report Status 11/10/2014 FINAL  Final  Culture, blood (routine x 2)     Status: None   Collection Time:  11/08/14 10:26 AM  Result Value Ref Range Status   Specimen Description BLOOD RIGHT HAND  Final   Special Requests BOTTLES DRAWN AEROBIC AND ANAEROBIC 6CC  Final   Culture NO GROWTH 5 DAYS  Final   Report Status 11/13/2014 FINAL  Final  Culture, blood (routine x 2)     Status: None   Collection Time: 11/08/14 10:26 AM  Result Value Ref Range Status   Specimen Description RIGHT ANTECUBITAL  Final   Special Requests BOTTLES DRAWN AEROBIC AND ANAEROBIC 6CC  Final   Culture NO GROWTH 5 DAYS  Final   Report Status 11/13/2014 FINAL  Final  MRSA PCR Screening     Status: None   Collection Time: 11/08/14  8:36 PM  Result Value Ref Range Status   MRSA by PCR NEGATIVE NEGATIVE Final    Comment:        The GeneXpert MRSA Assay (FDA approved for NASAL specimens only), is one component of a comprehensive MRSA colonization surveillance program. It is not intended to diagnose MRSA infection nor to guide or monitor treatment for MRSA infections.      Studies: No results found.  Scheduled  Meds: . antiseptic oral rinse  7 mL Mouth Rinse q12n4p  . carvedilol  3.125 mg Oral BID  . chlorhexidine  15 mL Mouth Rinse BID  . docusate sodium  100 mg Oral BID  . enoxaparin (LOVENOX) injection  30 mg Subcutaneous Q24H  . feeding supplement (GLUCERNA SHAKE)  237 mL Oral TID BM  . furosemide  20 mg Oral Daily  . guaiFENesin  1,200 mg Oral BID  . insulin aspart  0-5 Units Subcutaneous QHS  . insulin aspart  0-9 Units Subcutaneous TID WC  . loratadine  10 mg Oral Daily  . ondansetron (ZOFRAN) IV  4 mg Intravenous Q6H   Or  . ondansetron  4 mg Oral Q6H  . pantoprazole  40 mg Oral BID  . sodium chloride  3 mL Intravenous Q12H   Continuous Infusions:   Principal Problem:   Acute respiratory failure with hypoxia Active Problems:   HTN (hypertension)   Protein calorie malnutrition   ARF (acute renal failure)   Elevated troponin   CHF (congestive heart failure)   Aspiration pneumonia    Hyperglycemia   Hypothermia   Diastolic dysfunction with acute on chronic heart failure   Acute encephalopathy   UTI (urinary tract infection)   Congestive heart disease   Nausea & vomiting    Time spent: 35 minutes    Jeralyn Bennett  [facility] Pager [phone]. If 7PM-7AM, please contact night-coverage at [email or web address], password [facility] 11/13/2014, 2:07 PM  LOS: 5 days

## 2014-11-13 NOTE — Plan of Care (Signed)
Problem: ICU Phase Progression Outcomes Goal: Voiding-avoid urinary catheter unless indicated Outcome: Not Progressing Urinary retention - failed voiding trial, foley replaced.

## 2014-11-13 NOTE — Progress Notes (Signed)
PT Cancellation Note  Patient Details Name: Briana Schultz MRN: [id number] DOB: 12/27/1912   Cancelled Treatment:    Reason Eval/Treat Not Completed: Other (comment).  Pt c/o nausea when attempt was made to see her this morning.  Will try again tomorrow.   Myrlene BrokerBrown, Calynn Ferrero L 11/13/2014, 3:04 PM

## 2014-11-14 ENCOUNTER — Inpatient Hospital Stay (HOSPITAL_COMMUNITY): Payer: Medicare Other

## 2014-11-14 LAB — GLUCOSE, CAPILLARY
GLUCOSE-CAPILLARY: 213 mg/dL — AB (ref 65–99)
Glucose-Capillary: 124 mg/dL — ABNORMAL HIGH (ref 65–99)
Glucose-Capillary: 268 mg/dL — ABNORMAL HIGH (ref 65–99)
Glucose-Capillary: 82 mg/dL (ref 65–99)

## 2014-11-14 LAB — CBC
HEMATOCRIT: 40.5 % (ref 36.0–46.0)
HEMOGLOBIN: 12.6 g/dL (ref 12.0–15.0)
MCH: 31.6 pg (ref 26.0–34.0)
MCHC: 31.1 g/dL (ref 30.0–36.0)
MCV: 101.5 fL — AB (ref 78.0–100.0)
Platelets: 146 10*3/uL — ABNORMAL LOW (ref 150–400)
RBC: 3.99 MIL/uL (ref 3.87–5.11)
RDW: 13.7 % (ref 11.5–15.5)
WBC: 7.4 10*3/uL (ref 4.0–10.5)

## 2014-11-14 LAB — BASIC METABOLIC PANEL
ANION GAP: 9 (ref 5–15)
BUN: 32 mg/dL — ABNORMAL HIGH (ref 6–20)
CO2: 47 mmol/L — AB (ref 22–32)
CREATININE: 0.69 mg/dL (ref 0.44–1.00)
Calcium: 9.3 mg/dL (ref 8.9–10.3)
Chloride: 87 mmol/L — ABNORMAL LOW (ref 101–111)
GFR calc non Af Amer: >60 mL/min (ref >60)
GLUCOSE: 136 mg/dL — AB (ref 65–99)
POTASSIUM: 3.7 mmol/L (ref 3.5–5.1)
Sodium: 143 mmol/L (ref 135–145)

## 2014-11-14 MED ORDER — POLYETHYLENE GLYCOL 3350 17 G PO PACK
17.0000 g | PACK | Freq: Every day | ORAL | Status: DC
  Administered 2014-11-14 – 2014-11-15 (×2): 17 g via ORAL
  Filled 2014-11-14 (×2): qty 1

## 2014-11-14 MED ORDER — POLYETHYLENE GLYCOL 3350 17 G PO PACK: 17.0000 g | PACK | Freq: Every day | ORAL | Status: DC

## 2014-11-14 NOTE — Evaluation (Addendum)
Physical Therapy Progress Note Patient Details Name: Dow AdolphHallie M Boldon MRN: [id number] DOB: 10/17/1912 Today's Date: 11/14/2014   History of Present Illness     Clinical Impression  Pt was seen for evaluation.  She reported mild abdominal queasiness but much better today.  She was alert and very cooperative, able to follow all directions.  She is found to be generally deconditioned but has 3/5 strength throughout.  She was able to assist with supine to sit transfer but needed max assist to stand at bedside and transfer to a chair.  Because she is so cooperative and able to follow all directions, she does have some rehab potential even if it does not involve gait. (i.e. Developing transfer ability bed to chair).    Follow Up Recommendations      Equipment Recommendations       Recommendations for Other Services   none    Precautions / Restrictions        Mobility  Bed Mobility                  Transfers                    Ambulation/Gait                Stairs            Wheelchair Mobility    Modified Rankin (Stroke Patients Only)       Balance                                             Pertinent Vitals/Pain      Home Living                        Prior Function                 Hand Dominance        Extremity/Trunk Assessment                         Communication      Cognition                            General Comments      Exercises        Assessment/Plan    PT Assessment    PT Diagnosis     PT Problem List    PT Treatment Interventions     PT Goals (Current goals can be found in the Care Plan section)      Frequency     Barriers to discharge        Co-evaluation               End of Session                 Time:  -      Charges:         PT G CodesMyrlene Broker:        Ginger Leeth L  PT 11/14/2014, 2:51 PM

## 2014-11-14 NOTE — Progress Notes (Signed)
Patient has not voided during the time she was due to void, bladder scan showed approximately 180 ml of urine. Paged on-call MD will follow new orders given, encourage PO intake and continue to monitor the patient.

## 2014-11-14 NOTE — Care Management Note (Signed)
Case Management Note  Patient Details  Name: Briana Schultz MRN: [id number] Date of Birth: 06/17/1913  Subjective/Objective:                    Action/Plan:   Expected Discharge Date:                  Expected Discharge Plan:  Home/Self Care  In-House Referral:  NA  Discharge planning Services  CM Consult  Post Acute Care Choice:  Resumption of Svcs/PTA Provider Choice offered to:     DME Arranged:    DME Agency:     HH Arranged:    HH Agency:     Status of Service:  In process, will continue to follow  Medicare Important Message Given:    Date Medicare IM Given:    Medicare IM give by:    Date Additional Medicare IM Given:    Additional Medicare Important Message give by:     If discussed at Long Length of Stay Meetings, dates discussed: 11/13/14   Additional Comments:  Cheryl FlashBlackwell, Chayce Robbins Crowder, RN 11/14/2014, 3:36 PM

## 2014-11-14 NOTE — Clinical Social Work Note (Signed)
Clinical Social Work Assessment  Patient Details  Name: JOHAN ANTONACCI MRN: [id number] Date of Birth: 1912-07-07  Date of referral:  11/14/14               Reason for consult:  Facility Placement                Permission sought to share information with:  Family Supports Permission granted to share information::     Name::     Games developer::     Relationship::  daughter  Contact Information:     Housing/Transportation Living arrangements for the past 2 months:  Single Family Home Source of Information:  Adult Children Patient Interpreter Needed:  None Criminal Activity/Legal Involvement Pertinent to Current Situation/Hospitalization:  No - Comment as needed Significant Relationships:  Adult Children, Other Family Members Lives with:  Adult Children Do you feel safe going back to the place where you live?  No (Pt's daughter does not feel that she can manage pt right now due to weakness.) Need for family participation in patient care:  Yes (Comment)  Care giving concerns:  Pt's daughter is concerned that she cannot manage pt at home unless she is able to assist her in transfers.    Social Worker assessment / plan:  CSW met with pt's daughter/HCPOA [name] at bedside. Pt sleeping during assessment. Holley Raring reports that pt lives with her. She states that pt was vomiting and weak so she wanted her evaluated in ED. Admitted with acute on chronic heart failure. Holley Raring said that pt had been doing very well until she broke her hip last July and since then has had several hospitalizations and SNF stays. Most recently, pt was at [facility] and begged to go home so Holley Raring took her home. She states they had been managing okay and had [facility]. At baseline, pt ambulates very minimal distance with a walker. She primarily uses a wheelchair. PT evaluated pt today and recommendation is for SNF. CSW discussed placement process and [facility] Medicare authorization. [name] requests [place], [place] facility, [facility], or [facility]. Pt has been to several SNFs in the past so she understands process. She said that her plan is to take pt back home, but she is unable to lift pt.   Employment status:  Retired Nurse, adult PT Recommendations:  [place] / Referral to community resources:  [place]  Patient/Family's Response to care:  Pt's daughter states she was expecting pt to d/c to SNF from hospital.   Patient/Family's Understanding of and Emotional Response to Diagnosis, Current Treatment, and Prognosis:  Pt's daughter appears to have understanding of pt's medical issues and is aware of treatment plan.   Emotional Assessment Appearance:  Appears stated age Attitude/Demeanor/Rapport:  Unable to Assess Affect (typically observed):  Unable to Assess Orientation:   (not assessed) Alcohol / Substance use:  Not Applicable Psych involvement (Current and /or in the community):  No (Comment)  Discharge Needs  Concerns to be addressed:  Discharge Planning Concerns Readmission within the last 30 days:  No Current discharge risk:  Physical Impairment Barriers to Discharge:  Continued Medical Work up   Salome Arnt, [place] 11/14/2014, 11:18 AM [phone]

## 2014-11-14 NOTE — Progress Notes (Signed)
Foley removed per order.  Patient DTV by 2040 this evening

## 2014-11-14 NOTE — Progress Notes (Signed)
Patient has not voided, DTV by 2040 this evening.  Bladder scan shows approx 75cc of urine.  Patient did have a better appetite with dinner this evening and did drink more fluids.  MD aware.  No new orders at this time.. Will encourage PO intake.

## 2014-11-14 NOTE — Clinical Social Work Placement (Signed)
   CLINICAL SOCIAL WORK PLACEMENT  NOTE  Date:  11/14/2014  Patient Details  Name: Briana Schultz MRN: [id number] Date of Birth: 09/30/1912  Clinical Social Work is seeking post-discharge placement for this patient at the Skilled  Nursing Facility level of care (*CSW will initial, date and re-position this form in  chart as items are completed):  Yes   Patient/family provided with [place] Clinical Social Work Department's list of facilities offering this level of care within the geographic area requested by the patient (or if unable, by the patient's family).  Yes   Patient/family informed of their freedom to choose among providers that offer the needed level of care, that participate in Medicare, Medicaid or managed care program needed by the patient, have an available bed and are willing to accept the patient.  Yes   Patient/family informed of [facility] ownership interest in [facility] and [facility], as well as of the fact that they are under no obligation to receive care at these facilities.  PASRR submitted to EDS on       PASRR number received on       Existing PASRR number confirmed on 11/14/14     FL2 transmitted to all facilities in geographic area requested by pt/family on 11/14/14     FL2 transmitted to all facilities within larger geographic area on       Patient informed that his/her managed care company has contracts with or will negotiate with certain facilities, including the following:            Patient/family informed of bed offers received.  Patient chooses bed at       Physician recommends and patient chooses bed at      Patient to be transferred to   on  .  Patient to be transferred to facility by       Patient family notified on   of transfer.  Name of family member notified:        PHYSICIAN       Additional Comment:    _______________________________________________ Karn CassisStultz, Brithney Bensen [place], LCSW 11/14/2014, 11:11  AM [phone]

## 2014-11-14 NOTE — Progress Notes (Signed)
[facility] PROGRESS NOTE  Briana AdolphHallie M Schultz NWG:[id number]:[id number] DOB: 05/02/1913 DOA: 11/08/2014 PCP: Pamelia HoitWILSON,FRED HENRY, MD  Assessment/Plan: 1. Acute respiratory failure with hypercapnia -Evidence by initial ABG showing a PCO2 of 92.9 with pH of 7.219 and PO2 of 62.8 -Likely secondary to acute decompensated congestive heart failure. -Improved with IV diuresis  2.  Acute encephalopathy -Suspect hypercarbia contributing to her encephalopathy -Improved, appears to be functioning at her baseline level.  3.  Possible aspiration pneumonia. -Family reporting aspiration event at home as she presented with acute respiratory failure. -Repeat chest x-ray performed on 11/11/2014 showed no acute cardiopulmonary disease -patient stable respiratory status -She has well evaluation done during this hospitalization for which speech pathology felt she was experiencing esophageal dysphagia, recommended dysphasia 1 diet. -Repeat chest x-ray performed 11/14/2014 showing stable cardiomegaly no acute lung disease.  4.  Constipation -Patient not having a bowel movement for 5 days, was given Dulcolax suppository and lactulose. She required disimpaction. After these interventions had good results. -Patient reporting improvement to her nausea today, seems to be tolerating by mouth  5.  Dysphasia. -Patient seen and evaluated by speech pathology, found to have esophageal dysphagia which could explain nausea. -SLP recommending dysphasia 1 diet  6. Severe protein calorie malnutrition. -Nutrition consult, continue protein boost.  7.  Nausea -Patient reporting nausea not associate with vomiting for the past several days. I suspect multifactorial with esophageal dysphasia, functional decline, constipation all likely contributors.  -Patient showing significant improvement today after having a large bowel movement yesterday. As mentioned above I think as the patient was probably contributing to her nausea.   Code  Status: DO NOT RESUSCITATE Family Communication: I spoke with her daughter at bedside Disposition Plan: Anticipate discharge to skilled nursing facility in the next 24 hours.   Consultants:  Physical therapy  Speech pathology    HPI/Subjective: Patient is a 81 year old female with a past medical history of diastolic congestive heart failure, atrial fibrillation, hypertension, admitted to the medicine service on 11/08/2014 when she presented with functional decline, failure to thrive, shortness of breath, encephalopathy. Initial lab work revealed a BNP of 1330, with chest x-ray showing evidence of CHF and question superimposed pneumonia. Urinalysis was negative for leukocytes and nitrates. Acute respiratory failure with hypercapnia likely secondary to acute decompensated congestive heart failure. She was initially placed on BiPAP and started on IV Lasix. She subsequently showed clinical improvement and was later transitioned to oral Lasix. With guard to the possibility of pneumonia family members had reported episode of nausea vomiting at home for which aspiration ammonia was suspected. She was treated with empiric IV antibiotic therapy with Primaxin. During this hospitalization she complained of persistent nausea. Abdominal x-ray was unremarkable. She reported ongoing nausea despite receiving IV antibiotic therapy. Patient not having a bowel movement in 4-5 days, it was felt constipation could be contributing to her nausea. She was placed on bowel regimen with good results on 11/13/2014.  Objective: Filed Vitals:   11/14/14 1531  BP: 125/41  Pulse: 82  Temp: 97.8 F (36.6 C)  Resp: 20    Intake/Output Summary (Last 24 hours) at 11/14/14 1824 Last data filed at 11/14/14 1744  Gross per 24 hour  Intake    600 ml  Output    800 ml  Net   -200 ml   Filed Weights   11/12/14 0426 11/13/14 0500 11/14/14 0405  Weight: 38.8 kg (85 lb 8.6 oz) 38.42 kg (84 lb 11.2 oz) 39.191 kg (86 lb 6.4 oz)  Exam:   General:  Patient complains of ongoing nausea, although no acute distress awake and alert can follow simple commands  Cardiovascular: Regular rate and rhythm normal S1-S2 no extremity edema  Respiratory: Normal respiratory effort, lungs were clear to auscultation bilaterally  Abdomen: Soft nontender nondistended  Musculoskeletal: No edema   Data Reviewed: Basic Metabolic Panel:  Recent Labs Lab 11/10/14 0427 11/11/14 0551 11/12/14 0616 11/13/14 0617 11/14/14 0616  NA 141 140 141 141 143  K 3.4* 3.3* 3.4* 3.7 3.7  CL 92* 84* 84* 86* 87*  CO2 43* 46* 49* 48* 47*  GLUCOSE 103* 138* 143* 177* 136*  BUN 31* 26* 30* 36* 32*  CREATININE 0.84 0.78 0.79 0.78 0.69  CALCIUM 8.3* 8.4* 9.2 9.3 9.3  MG  --  1.6*  --   --   --    Liver Function Tests:  Recent Labs Lab 11/08/14 1012 11/09/14 0457  AST 84* 50*  ALT 44 43  ALKPHOS 90 79  BILITOT 1.1 0.9  PROT 6.8 6.3*  ALBUMIN 4.0 3.7    Recent Labs Lab 11/11/14 0551  LIPASE 30   No results for input(s): AMMONIA in the last 168 hours. CBC:  Recent Labs Lab 11/08/14 1012 11/09/14 0457 11/10/14 0427 11/11/14 0551 11/14/14 0616  WBC 12.9* 12.0* 7.5 9.9 7.4  NEUTROABS 11.4*  --   --   --   --   HGB 13.2 11.8* 10.9* 12.8 12.6  HCT 42.0 37.1 33.8* 39.7 40.5  MCV 102.9* 101.6* 101.2* 99.3 101.5*  PLT 153 145* 118* 134* 146*   Cardiac Enzymes:  Recent Labs Lab 11/08/14 1012 11/08/14 1806 11/09/14 0457  TROPONINI 0.39* 0.63* 0.59*   BNP (last 3 results)  Recent Labs  08/11/14 1530 11/08/14 1012  BNP 879.0* 1330.0*    ProBNP (last 3 results)  Recent Labs  01/02/14 1212  PROBNP 456.0*    CBG:  Recent Labs Lab 11/13/14 1640 11/13/14 2200 11/14/14 0732 11/14/14 1145 11/14/14 1614  GLUCAP 176* 109* 124* 268* 82    Recent Results (from the past 240 hour(s))  Urine culture     Status: None   Collection Time: 11/08/14 10:10 AM  Result Value Ref Range Status   Specimen  Description URINE, CATHETERIZED  Final   Special Requests NONE  Final   Colony Count NO GROWTH [facility]formed at [facility]   Final   Culture NO GROWTH Performed at [facility]   Final   Report Status 11/10/2014 FINAL  Final  Culture, blood (routine x 2)     Status: None   Collection Time: 11/08/14 10:26 AM  Result Value Ref Range Status   Specimen Description BLOOD RIGHT HAND  Final   Special Requests BOTTLES DRAWN AEROBIC AND ANAEROBIC 6CC  Final   Culture NO GROWTH 5 DAYS  Final   Report Status 11/13/2014 FINAL  Final  Culture, blood (routine x 2)     Status: None   Collection Time: 11/08/14 10:26 AM  Result Value Ref Range Status   Specimen Description RIGHT ANTECUBITAL  Final   Special Requests BOTTLES DRAWN AEROBIC AND ANAEROBIC 6CC  Final   Culture NO GROWTH 5 DAYS  Final   Report Status 11/13/2014 FINAL  Final  MRSA PCR Screening     Status: None   Collection Time: 11/08/14  8:36 PM  Result Value Ref Range Status   MRSA by PCR NEGATIVE NEGATIVE Final    Comment:        The GeneXpert  MRSA Assay (FDA approved for NASAL specimens only), is one component of a comprehensive MRSA colonization surveillance program. It is not intended to diagnose MRSA infection nor to guide or monitor treatment for MRSA infections.      Studies: Dg Chest Port 1 View  11/14/2014   CLINICAL DATA:  Aspiration into respiratory tract. Acute respiratory failure with hypoxia. Congestive heart failure. Acute renal failure.  EXAM: PORTABLE CHEST - 1 VIEW  COMPARISON:  11/11/2014  FINDINGS: Moderate severe cardiomegaly and ectasia of the thoracic aorta remains stable. Coarsening of interstitial lung markings appears chronic. No evidence of acute infiltrate or pleural effusion. Several old left rib fracture deformities again noted, as well as a incompletely healed, subacute fracture of the right humeral neck.  IMPRESSION: Stable cardiomegaly and thoracic aortic ectasia. No acute lung  disease.  Subacute incompletely healed right humeral neck fracture again noted.   Electronically Signed   By: Myles Rosenthal M.D.   On: 11/14/2014 09:51    Scheduled Meds: . antiseptic oral rinse  7 mL Mouth Rinse q12n4p  . carvedilol  3.125 mg Oral BID  . chlorhexidine  15 mL Mouth Rinse BID  . docusate sodium  100 mg Oral BID  . enoxaparin (LOVENOX) injection  30 mg Subcutaneous Q24H  . feeding supplement (GLUCERNA SHAKE)  237 mL Oral TID BM  . furosemide  20 mg Oral Daily  . guaiFENesin  1,200 mg Oral BID  . insulin aspart  0-5 Units Subcutaneous QHS  . insulin aspart  0-9 Units Subcutaneous TID WC  . loratadine  10 mg Oral Daily  . ondansetron (ZOFRAN) IV  4 mg Intravenous Q6H   Or  . ondansetron  4 mg Oral Q6H  . pantoprazole  40 mg Oral BID  . sodium chloride  3 mL Intravenous Q12H   Continuous Infusions:   Principal Problem:   Acute respiratory failure with hypoxia Active Problems:   HTN (hypertension)   Protein calorie malnutrition   ARF (acute renal failure)   Elevated troponin   CHF (congestive heart failure)   Aspiration pneumonia   Hyperglycemia   Hypothermia   Diastolic dysfunction with acute on chronic heart failure   Acute encephalopathy   UTI (urinary tract infection)   Congestive heart disease   Nausea & vomiting    Time spent: 35 minutes    Jeralyn Bennett  [facility] Pager [phone]. If 7PM-7AM, please contact night-coverage at [email or web address], password [facility] 11/14/2014, 6:24 PM  LOS: 6 days

## 2014-11-15 DIAGNOSIS — K5900 Constipation, unspecified: Secondary | ICD-10-CM

## 2014-11-15 LAB — CREATININE, SERUM
CREATININE: 0.67 mg/dL (ref 0.44–1.00)
GFR calc Af Amer: >60 mL/min (ref >60)

## 2014-11-15 LAB — GLUCOSE, CAPILLARY: Glucose-Capillary: 132 mg/dL — ABNORMAL HIGH (ref 65–99)

## 2014-11-15 MED ORDER — ALPRAZOLAM 0.5 MG PO TABS
ORAL_TABLET | ORAL | Status: DC
Start: 2014-11-15 — End: 2015-01-21

## 2014-11-15 MED ORDER — BISACODYL 10 MG RE SUPP: 10.0000 mg | Freq: Every day | RECTAL | Status: AC | PRN

## 2014-11-15 MED ORDER — POLYETHYLENE GLYCOL 3350 17 G PO PACK: 17.0000 g | PACK | Freq: Every day | ORAL | Status: AC

## 2014-11-15 MED ORDER — PANTOPRAZOLE SODIUM 40 MG PO TBEC: 40.0000 mg | DELAYED_RELEASE_TABLET | Freq: Every day | ORAL | Status: AC

## 2014-11-15 MED ORDER — FUROSEMIDE 20 MG PO TABS: 20.0000 mg | ORAL_TABLET | Freq: Every day | ORAL | Status: AC

## 2014-11-15 NOTE — Progress Notes (Signed)
Dow AdolphHallie M Shankel discharged [facility] per MD order.  Report called to receiving Kimber RelicMariama, Nurse at 1540.     Medication List    STOP taking these medications        meloxicam 7.5 MG tablet  Commonly known as:  MOBIC     promethazine 25 MG tablet  Commonly known as:  PHENERGAN      TAKE these medications        acetaminophen 500 MG tablet  Commonly known as:  TYLENOL  Take 500 mg by mouth every 6 (six) hours as needed.     ALPRAZolam 0.5 MG tablet  Commonly known as:  XANAX  Take one tablet by mouth at bedtime to help rest     alum & mag hydroxide-simeth 200-200-20 MG/5ML suspension  Commonly known as:  MAALOX/MYLANTA  Take 30 mLs by mouth 2 (two) times daily as needed for indigestion.     amLODipine 5 MG tablet  Commonly known as:  NORVASC  Take 10 mg by mouth daily.     bisacodyl 10 MG suppository  Commonly known as:  DULCOLAX  Place 1 suppository (10 mg total) rectally daily as needed for moderate constipation.     calcium carbonate 600 MG Tabs tablet  Commonly known as:  OS-CAL  Take 1,200 mg by mouth daily with breakfast.     carvedilol 3.125 MG tablet  Commonly known as:  COREG  Take 1 tablet (3.125 mg total) by mouth 2 (two) times daily.     docusate sodium 100 MG capsule  Commonly known as:  COLACE  Take 1 capsule (100 mg total) by mouth 2 (two) times daily. Continue this while taking narcotics to help with bowel movements     esomeprazole 40 MG capsule  Commonly known as:  NEXIUM  Take 40 mg by mouth daily at 12 noon.     feeding supplement (GLUCERNA SHAKE) Liqd  Take 237 mLs by mouth 3 (three) times daily between meals.     ferrous sulfate 325 (65 FE) MG tablet  Take 1 tablet (325 mg total) by mouth 2 (two) times daily with a meal.     furosemide 20 MG tablet  Commonly known as:  LASIX  Take 1 tablet (20 mg total) by mouth daily.     loratadine 10 MG tablet  Commonly known as:  CLARITIN  Take 10 mg by mouth daily.     multivitamin  with minerals Tabs tablet  Take 1 tablet by mouth daily.     pantoprazole 40 MG tablet  Commonly known as:  PROTONIX  Take 1 tablet (40 mg total) by mouth daily.     polyethylene glycol packet  Commonly known as:  MIRALAX / GLYCOLAX  Take 17 g by mouth daily.     Vitamin D (Ergocalciferol) 50000 UNITS Caps capsule  Commonly known as:  DRISDOL  Take 50,000 Units by mouth every 7 (seven) days. Take on Friday.       Patients foley catheter is left in place at this time per MD order for urinary retention.  Patients skin is clean, dry and intact, no evidence of skin break down. IV site discontinued and catheter remains intact. Site without signs and symptoms of complications. Dressing and pressure applied.  Patient transported on a hospital bed,  no distress noted upon discharge.  Ubaldo GlassingJames, Darleth Eustache Morgan 11/15/2014 3:38 PM

## 2014-11-15 NOTE — Progress Notes (Signed)
Notified MD that patient had not voided any this AM. Bladder scanned patient and it showed 197 ml. Patient had no complaints. MD ordered a foley catheter to be placed due to urinary retention. Urinary catheter was placed by Kriste BasqueBecky, RN and myself. Patient tolerated the catheter insertion well and with no complaints. Will continue to monitor patient at this time.

## 2014-11-15 NOTE — Clinical Social Work Note (Signed)
Pt's daughter chooses bed at [facility]. Authorization received. Pt d/c today and will transfer with staff.   Derenda FennelKara Kawan [place], LCSW [phone]

## 2014-11-15 NOTE — Discharge Summary (Signed)
Physician Discharge Summary  Briana Schultz:[id number] DOB: Feb 01, 1913 DOA: 11/08/2014  PCP: Pamelia Hoit, MD  Admit date: 11/08/2014 Discharge date: 11/15/2014  Time spent: 35 minutes  Recommendations for Outpatient Follow-up:  1. Please follow up on BMP and CBC in -3-4 days, patient was discharged on Lasix for CHF and will need kidney function monitored.  2. Follow up on constipation, she was discharged on bowel regimen, I suspect contipation playing a role in her nausea.  3. Patient to be discharged to SNF for rehab  Discharge Diagnoses:  Principal Problem:   Acute respiratory failure with hypoxia Active Problems:   HTN (hypertension)   Protein calorie malnutrition   ARF (acute renal failure)   Elevated troponin   CHF (congestive heart failure)   Aspiration pneumonia   Hyperglycemia   Hypothermia   Diastolic dysfunction with acute on chronic heart failure   Acute encephalopathy   UTI (urinary tract infection)   Congestive heart disease   Nausea & vomiting   CN (constipation)   Discharge Condition: Stable/Improved  Diet recommendation: Dysphagia 1 with thin liquids   Filed Weights   11/13/14 0500 11/14/14 0405 11/15/14 0647  Weight: 38.42 kg (84 lb 11.2 oz) 39.191 kg (86 lb 6.4 oz) 39.182 kg (86 lb 6.1 oz)    History of present illness:  This patient was brought into the hospital by her family after she was noted to be increasingly lethargic. She was found lying in bed this morning and it appeared that she had vomited. Patient appears to be lethargic. She was noted to be hypoxic and per EMS report, somewhat cyanotic. She was placed on supplemental oxygen and brought to the emergency room for evaluation. Family reports that her by mouth intake has been poor. Her daughter feels that her urinary output has decreased and her urine is more concentrated as well as foul-smelling. The patient has chronic nausea that daughter has been giving her Phenergan. Workup in the  emergency room showed an x-ray with possible CHF. BNP is mildly elevated above prior levels. EKG did not show any acute findings. On exam, her lung sounds are diminished as her respiratory effort is poor. She is lethargic but does open her eyes to voice. She does have 1-2+ pitting edema in her lower show remedies. At this point, the exact clinical picture is not entirely clear. It appears that she is developing a aspiration pneumonia and she was started on intravenous antibiotics. Also, she has been started on intravenous Lasix for some component of acute on chronic diastolic congestive heart failure. Her encephalopathy may be related to medications and she is receiving frequent doses of Phenergan. She may have some element of intravascular volume depletion. We will provide gentle hydration for the next 10 hours and monitor urine output. Place Foley catheter to accurately measure urine output in the setting of diuresis.  Hospital Course:  Patient is a 81 year old female with a past medical history of diastolic congestive heart failure, atrial fibrillation, hypertension, admitted to the medicine service on 11/08/2014 when she presented with functional decline, failure to thrive, shortness of breath, encephalopathy. Initial lab work revealed a BNP of 1330, with chest x-ray showing evidence of CHF and question superimposed pneumonia. Urinalysis was negative for leukocytes and nitrates. Acute respiratory failure with hypercapnia likely secondary to acute decompensated congestive heart failure. She was initially placed on BiPAP and started on IV Lasix. She subsequently showed clinical improvement and was later transitioned to oral Lasix. With guard to the possibility of pneumonia  family members had reported episode of nausea vomiting at home for which aspiration ammonia was suspected. She was treated with empiric IV antibiotic therapy with Primaxin. Repeat CXR on 11/14/2014 remained stable with no acute lung disease,  stable from previous CXR on 11/11/2014. She remained afebrile. IV antimicrobial therapy stopped.  During this hospitalization she complained of persistent nausea. Abdominal x-ray was unremarkable. She reported ongoing nausea despite receiving IV antibiotic therapy. Patient not having a bowel movement in 4-5 days, it was felt constipation could be contributing to her nausea. She was placed on bowel regimen with good results on 11/13/2014. After this intervention she reported improvement to her nausea and was able to tolerate PO. She was discharged to SNF for acute rehab on 11/15/2014 in stable condition.     Discharge Exam: Filed Vitals:   11/15/14 0647  BP: 143/42  Pulse: 78  Temp: 98 F (36.7 C)  Resp: 20     General: Patient sitting at bedside chair, currently denies nausea or abdominal pain. Tolerating PO  Cardiovascular: Regular rate and rhythm normal S1-S2 no extremity edema  Respiratory: Normal respiratory effort, lungs were clear to auscultation bilaterally  Abdomen: Soft nontender nondistended  Musculoskeletal: No edema  Discharge Instructions   Discharge Instructions    Call MD for:  difficulty breathing, headache or visual disturbances    Complete by:  As directed      Call MD for:  extreme fatigue    Complete by:  As directed      Call MD for:  hives    Complete by:  As directed      Call MD for:  persistant dizziness or light-headedness    Complete by:  As directed      Call MD for:  persistant nausea and vomiting    Complete by:  As directed      Call MD for:  redness, tenderness, or signs of infection (pain, swelling, redness, odor or green/yellow discharge around incision site)    Complete by:  As directed      Call MD for:  severe uncontrolled pain    Complete by:  As directed      Call MD for:  temperature >100.4    Complete by:  As directed      Diet - low sodium heart healthy    Complete by:  As directed      Increase activity slowly    Complete by:  As  directed           Current Discharge Medication List    START taking these medications   Details  bisacodyl (DULCOLAX) 10 MG suppository Place 1 suppository (10 mg total) rectally daily as needed for moderate constipation. Qty: 12 suppository, Refills: 0    furosemide (LASIX) 20 MG tablet Take 1 tablet (20 mg total) by mouth daily. Qty: 30 tablet, Refills: 1    pantoprazole (PROTONIX) 40 MG tablet Take 1 tablet (40 mg total) by mouth daily. Qty: 30 tablet, Refills: 1    polyethylene glycol (MIRALAX / GLYCOLAX) packet Take 17 g by mouth daily. Qty: 14 each, Refills: 0      CONTINUE these medications which have CHANGED   Details  ALPRAZolam (XANAX) 0.5 MG tablet Take one tablet by mouth at bedtime to help rest Qty: 20 tablet, Refills: 0      CONTINUE these medications which have NOT CHANGED   Details  alum & mag hydroxide-simeth (MAALOX/MYLANTA) 200-200-20 MG/5ML suspension Take 30 mLs by mouth 2 (two)  times daily as needed for indigestion.    amLODipine (NORVASC) 5 MG tablet Take 10 mg by mouth daily.     calcium carbonate (OS-CAL) 600 MG TABS Take 1,200 mg by mouth daily with breakfast.     carvedilol (COREG) 3.125 MG tablet Take 1 tablet (3.125 mg total) by mouth 2 (two) times daily. Qty: 180 tablet, Refills: 3    docusate sodium (COLACE) 100 MG capsule Take 1 capsule (100 mg total) by mouth 2 (two) times daily. Continue this while taking narcotics to help with bowel movements Qty: 30 capsule, Refills: 1    esomeprazole (NEXIUM) 40 MG capsule Take 40 mg by mouth daily at 12 noon.    feeding supplement (GLUCERNA SHAKE) LIQD Take 237 mLs by mouth 3 (three) times daily between meals.     ferrous sulfate 325 (65 FE) MG tablet Take 1 tablet (325 mg total) by mouth 2 (two) times daily with a meal. Refills: 3    loratadine (CLARITIN) 10 MG tablet Take 10 mg by mouth daily.    Multiple Vitamin (MULTIVITAMIN WITH MINERALS) TABS Take 1 tablet by mouth daily.    Vitamin D,  Ergocalciferol, (DRISDOL) 50000 UNITS CAPS capsule Take 50,000 Units by mouth every 7 (seven) days. Take on Friday.    acetaminophen (TYLENOL) 500 MG tablet Take 500 mg by mouth every 6 (six) hours as needed.      STOP taking these medications     acetaminophen (TYLENOL) 650 MG CR tablet      meloxicam (MOBIC) 7.5 MG tablet      promethazine (PHENERGAN) 25 MG tablet        Allergies  Allergen Reactions  . Amoxicillin Other (See Comments)    unknown  . Doxycycline Other (See Comments)    unknown  . Fosamax [Alendronate] Other (See Comments)    unknown  . Septra [Sulfamethoxazole-Trimethoprim] Other (See Comments)    unknown  . Tramadol     disoriented   Follow-up Information    Follow up with Pamelia HoitWILSON,FRED HENRY, MD In 2 weeks.   Specialty:  Family Medicine   Contact information:   [address] [place] [id number] [phone]        The results of significant diagnostics from this hospitalization (including imaging, microbiology, ancillary and laboratory) are listed below for reference.    Significant Diagnostic Studies: Dg Chest 1 View  11/08/2014   CLINICAL DATA:  Shortness of Breath  EXAM: CHEST  1 VIEW  COMPARISON:  September 24, 2014  FINDINGS: There is consolidation in the medial right base. There is cardiomegaly with pulmonary venous hypertension and mild interstitial edema. No adenopathy.  IMPRESSION: Evidence of a degree of congestive heart failure. Suspect superimposed pneumonia medial right base.   Electronically Signed   By: Bretta BangWilliam  Woodruff III M.D.   On: 11/08/2014 10:13   Dg Chest Port 1 View  11/14/2014   CLINICAL DATA:  Aspiration into respiratory tract. Acute respiratory failure with hypoxia. Congestive heart failure. Acute renal failure.  EXAM: PORTABLE CHEST - 1 VIEW  COMPARISON:  11/11/2014  FINDINGS: Moderate severe cardiomegaly and ectasia of the thoracic aorta remains stable. Coarsening of interstitial lung markings appears chronic. No evidence  of acute infiltrate or pleural effusion. Several old left rib fracture deformities again noted, as well as a incompletely healed, subacute fracture of the right humeral neck.  IMPRESSION: Stable cardiomegaly and thoracic aortic ectasia. No acute lung disease.  Subacute incompletely healed right humeral neck fracture again noted.  Electronically Signed   By: Myles Rosenthal M.D.   On: 11/14/2014 09:51   Dg Chest Port 1 View  11/11/2014   CLINICAL DATA:  History of CHF. Patient is lethargic with nausea vomiting.  EXAM: PORTABLE CHEST - 1 VIEW  COMPARISON:  11/09/2014  FINDINGS: Moderate to marked enlargement of the cardiopericardial silhouette. The aorta is uncoiled. No gross mediastinal or hilar masses. Prominent bronchovascular interstitial markings are noted in the lungs, stable. No lung consolidation or convincing pulmonary edema. No pneumothorax. Bony thorax is demineralized. Old right proximal humeral fracture is stable.  IMPRESSION: No acute cardiopulmonary disease.   Electronically Signed   By: Amie Portland M.D.   On: 11/11/2014 11:43   Dg Chest Port 1 View  11/09/2014   CLINICAL DATA:  81 year old female with a history of cough.  EXAM: PORTABLE CHEST - 1 VIEW  COMPARISON:  Plain film 11/08/2014, 09/24/2014, CT 08/12/2014  FINDINGS: Cardiomediastinal silhouette likely unchanged though right rotation somewhat limits evaluation. Tortuosity of the thoracic aorta.  No evidence of pulmonary vascular congestion.  Improving interstitial opacities bilaterally.  Blunting of the left costophrenic angle.  Persisting interstitial/airspace opacities of the right upper lobe. Right apex not well evaluated secondary to the technique.  Atherosclerosis.  Osteopenia.  Configuration of posttraumatic deformity of the right shoulder is unchanged from comparison plain films.  IMPRESSION: Improving interstitial/airspace opacities, persisting at the right apex, compatible with improving infection given the history.  Small left  pleural effusion.  Healing fractures of lower left and right ribs with callus formation, appear to have been present on the comparison CT.  Signed,  Yvone Neu. Loreta Ave, DO  [facility]  [facility]   Electronically Signed   By: Gilmer Mor D.O.   On: 11/09/2014 09:58   Dg Abd Portable 1v  11/11/2014   CLINICAL DATA:  Hx chf, lethargic, nausea,vomiting  EXAM: PORTABLE ABDOMEN - 1 VIEW  COMPARISON:  None.  FINDINGS: Normal bowel gas pattern. There are surgical clips are upper quadrant from a prior cholecystectomy. Vascular calcifications are noted along the abdominal aorta and the iliac vessels. Soft tissues are otherwise unremarkable.  Previous right proximal femur fracture has been reduced with an intra medullary rod and compression screw. Skeletal structures are diffusely demineralized.  IMPRESSION: 1. No acute finding.  No evidence of bowel obstruction.   Electronically Signed   By: Amie Portland M.D.   On: 11/11/2014 11:45    Microbiology: Recent Results (from the past 240 hour(s))  Urine culture     Status: None   Collection Time: 11/08/14 10:10 AM  Result Value Ref Range Status   Specimen Description URINE, CATHETERIZED  Final   Special Requests NONE  Final   Colony Count NO GROWTH Performed at [facility]   Final   Culture NO GROWTH Performed at [facility]   Final   Report Status 11/10/2014 FINAL  Final  Culture, blood (routine x 2)     Status: None   Collection Time: 11/08/14 10:26 AM  Result Value Ref Range Status   Specimen Description BLOOD RIGHT HAND  Final   Special Requests BOTTLES DRAWN AEROBIC AND ANAEROBIC 6CC  Final   Culture NO GROWTH 5 DAYS  Final   Report Status 11/13/2014 FINAL  Final  Culture, blood (routine x 2)     Status: None   Collection Time: 11/08/14 10:26 AM  Result Value Ref Range Status   Specimen Description RIGHT ANTECUBITAL  Final   Special  Requests BOTTLES DRAWN AEROBIC AND ANAEROBIC 6CC   Final   Culture NO GROWTH 5 DAYS  Final   Report Status 11/13/2014 FINAL  Final  MRSA PCR Screening     Status: None   Collection Time: 11/08/14  8:36 PM  Result Value Ref Range Status   MRSA by PCR NEGATIVE NEGATIVE Final    Comment:        The GeneXpert MRSA Assay (FDA approved for NASAL specimens only), is one component of a comprehensive MRSA colonization surveillance program. It is not intended to diagnose MRSA infection nor to guide or monitor treatment for MRSA infections.      Labs: Basic Metabolic Panel:  Recent Labs Lab 11/10/14 0427 11/11/14 0551 11/12/14 0616 11/13/14 0617 11/14/14 0616 11/15/14 0630  NA 141 140 141 141 143  --   K 3.4* 3.3* 3.4* 3.7 3.7  --   CL 92* 84* 84* 86* 87*  --   CO2 43* 46* 49* 48* 47*  --   GLUCOSE 103* 138* 143* 177* 136*  --   BUN 31* 26* 30* 36* 32*  --   CREATININE 0.84 0.78 0.79 0.78 0.69 0.67  CALCIUM 8.3* 8.4* 9.2 9.3 9.3  --   MG  --  1.6*  --   --   --   --    Liver Function Tests:  Recent Labs Lab 11/08/14 1012 11/09/14 0457  AST 84* 50*  ALT 44 43  ALKPHOS 90 79  BILITOT 1.1 0.9  PROT 6.8 6.3*  ALBUMIN 4.0 3.7    Recent Labs Lab 11/11/14 0551  LIPASE 30   No results for input(s): AMMONIA in the last 168 hours. CBC:  Recent Labs Lab 11/08/14 1012 11/09/14 0457 11/10/14 0427 11/11/14 0551 11/14/14 0616  WBC 12.9* 12.0* 7.5 9.9 7.4  NEUTROABS 11.4*  --   --   --   --   HGB 13.2 11.8* 10.9* 12.8 12.6  HCT 42.0 37.1 33.8* 39.7 40.5  MCV 102.9* 101.6* 101.2* 99.3 101.5*  PLT 153 145* 118* 134* 146*   Cardiac Enzymes:  Recent Labs Lab 11/08/14 1012 11/08/14 1806 11/09/14 0457  TROPONINI 0.39* 0.63* 0.59*   BNP: BNP (last 3 results)  Recent Labs  08/11/14 1530 11/08/14 1012  BNP 879.0* 1330.0*    ProBNP (last 3 results)  Recent Labs  01/02/14 1212  PROBNP 456.0*    CBG:  Recent Labs Lab 11/13/14 2200 11/14/14 0732 11/14/14 1145 11/14/14 1614 11/14/14 2056   GLUCAP 109* 124* 268* 82 213*       Signed:  Averi Cacioppo  [facility] 11/15/2014, 9:40 AM

## 2014-11-15 NOTE — Care Management Note (Signed)
Case Management Note  Patient Details  Name: Briana Schultz MRN: [id number] Date of Birth: 07/20/1912  Subjective/Dow AdolphObjective:                    Action/Plan:   Expected Discharge Date:                  Expected Discharge Plan:  Home/Self Care  In-House Referral:  NA  Discharge planning Services  CM Consult  Post Acute Care Choice:  Resumption of Svcs/PTA Provider Choice offered to:  Adult Children  DME Arranged:    DME Agency:     HH Arranged:    HH Agency:     Status of Service:  Completed, signed off  Medicare Important Message Given:  Yes Date Medicare IM Given:  11/15/14 Medicare IM give by:  Arlyss Queenammy Ronie Fleeger, RN BSN CM Date Additional Medicare IM Given:    Additional Medicare Important Message give by:     If discussed at Long Length of Stay Meetings, dates discussed:    Additional Comments: [facility]. CSW to arrange discharge to facility. Arlyss QueenBlackwell, Shamya Macfadden [place], RN 11/15/2014, 10:53 AM

## 2014-11-15 NOTE — Clinical Social Work Placement (Signed)
   CLINICAL SOCIAL WORK PLACEMENT  NOTE  Date:  11/15/2014  Patient Details  Name: Briana Schultz MRN: [id number] Date of Birth: 11/14/1912  Clinical Social Work is seeking post-discharge placement for this patient at the Skilled  Nursing Facility level of care (*CSW will initial, date and re-position this form in  chart as items are completed):  Yes   Patient/family provided with [facility] Clinical Social Work Department's list of facilities offering this level of care within the geographic area requested by the patient (or if unable, by the patient's family).  Yes   Patient/family informed of their freedom to choose among providers that offer the needed level of care, that participate in Medicare, Medicaid or managed care program needed by the patient, have an available bed and are willing to accept the patient.  Yes   Patient/family informed of [facility]'s ownership interest [facility] and [facility], as well as of the fact that they are under no obligation to receive care at these facilities.  PASRR submitted to EDS on       PASRR number received on       Existing PASRR number confirmed on 11/14/14     FL2 transmitted to all facilities in geographic area requested by pt/family on 11/14/14     FL2 transmitted to all facilities within larger geographic area on       Patient informed that his/her managed care company has contracts with or will negotiate with certain facilities, including the following:        Yes   Patient/family informed of bed offers received.  [facility]     Physician recommends and patient chooses bed at      [facility] on 11[facility]15/14.  Patient to be transferred to facility by staff     Patient family notified on 11/15/14 of transfer.  Name of family member notified:  Rivka BarbaraGlenda- daughter     PHYSICIAN       Additional Comment:     _______________________________________________ Karn CassisStultz, Rayona Sardinha [place], LCSW 11/15/2014, 10:35 AM [phone]

## 2014-11-16 ENCOUNTER — Emergency Department (HOSPITAL_COMMUNITY)
Admission: EM | Admit: 2014-11-16 | Discharge: 2014-11-16 | Disposition: A | Discharge status: Home / Self Care | Payer: Medicare Other | Attending: Emergency Medicine | Admitting: Emergency Medicine

## 2014-11-16 ENCOUNTER — Inpatient Hospital Stay
Admission: RE | Admit: 2014-11-16 | Discharge: 2014-12-13 | Disposition: A | Discharge status: Home / Self Care | Payer: Medicare Other | Source: Ambulatory Visit | Attending: Internal Medicine | Admitting: Internal Medicine

## 2014-11-16 ENCOUNTER — Emergency Department (HOSPITAL_COMMUNITY): Payer: Medicare Other

## 2014-11-16 ENCOUNTER — Encounter (HOSPITAL_COMMUNITY): Payer: Self-pay | Admitting: Emergency Medicine

## 2014-11-16 ENCOUNTER — Other Ambulatory Visit: Payer: Self-pay

## 2014-11-16 DIAGNOSIS — S0001XA Abrasion of scalp, initial encounter: Secondary | ICD-10-CM | POA: Diagnosis not present

## 2014-11-16 DIAGNOSIS — Y998 Other external cause status: Secondary | ICD-10-CM | POA: Insufficient documentation

## 2014-11-16 DIAGNOSIS — S0003XA Contusion of scalp, initial encounter: Secondary | ICD-10-CM | POA: Insufficient documentation

## 2014-11-16 DIAGNOSIS — S61401A Unspecified open wound of right hand, initial encounter: Secondary | ICD-10-CM | POA: Insufficient documentation

## 2014-11-16 DIAGNOSIS — Z8619 Personal history of other infectious and parasitic diseases: Secondary | ICD-10-CM | POA: Insufficient documentation

## 2014-11-16 DIAGNOSIS — D649 Anemia, unspecified: Secondary | ICD-10-CM | POA: Diagnosis not present

## 2014-11-16 DIAGNOSIS — Z79899 Other long term (current) drug therapy: Secondary | ICD-10-CM | POA: Insufficient documentation

## 2014-11-16 DIAGNOSIS — N390 Urinary tract infection, site not specified: Secondary | ICD-10-CM | POA: Insufficient documentation

## 2014-11-16 DIAGNOSIS — Z87891 Personal history of nicotine dependence: Secondary | ICD-10-CM | POA: Insufficient documentation

## 2014-11-16 DIAGNOSIS — S81802A Unspecified open wound, left lower leg, initial encounter: Secondary | ICD-10-CM | POA: Insufficient documentation

## 2014-11-16 DIAGNOSIS — I5030 Unspecified diastolic (congestive) heart failure: Secondary | ICD-10-CM | POA: Insufficient documentation

## 2014-11-16 DIAGNOSIS — Y9289 Other specified places as the place of occurrence of the external cause: Secondary | ICD-10-CM | POA: Insufficient documentation

## 2014-11-16 DIAGNOSIS — R0902 Hypoxemia: Secondary | ICD-10-CM | POA: Diagnosis not present

## 2014-11-16 DIAGNOSIS — S3992XA Unspecified injury of lower back, initial encounter: Secondary | ICD-10-CM | POA: Insufficient documentation

## 2014-11-16 DIAGNOSIS — Z9981 Dependence on supplemental oxygen: Secondary | ICD-10-CM | POA: Diagnosis not present

## 2014-11-16 DIAGNOSIS — S199XXA Unspecified injury of neck, initial encounter: Secondary | ICD-10-CM | POA: Insufficient documentation

## 2014-11-16 DIAGNOSIS — I10 Essential (primary) hypertension: Secondary | ICD-10-CM | POA: Diagnosis not present

## 2014-11-16 DIAGNOSIS — Y9389 Activity, other specified: Secondary | ICD-10-CM | POA: Insufficient documentation

## 2014-11-16 DIAGNOSIS — Z9181 History of falling: Secondary | ICD-10-CM | POA: Diagnosis not present

## 2014-11-16 DIAGNOSIS — S81801A Unspecified open wound, right lower leg, initial encounter: Secondary | ICD-10-CM | POA: Diagnosis not present

## 2014-11-16 DIAGNOSIS — K219 Gastro-esophageal reflux disease without esophagitis: Secondary | ICD-10-CM | POA: Diagnosis not present

## 2014-11-16 DIAGNOSIS — W19XXXA Unspecified fall, initial encounter: Secondary | ICD-10-CM

## 2014-11-16 DIAGNOSIS — M1990 Unspecified osteoarthritis, unspecified site: Secondary | ICD-10-CM | POA: Insufficient documentation

## 2014-11-16 DIAGNOSIS — W01198A Fall on same level from slipping, tripping and stumbling with subsequent striking against other object, initial encounter: Secondary | ICD-10-CM | POA: Insufficient documentation

## 2014-11-16 DIAGNOSIS — Z8673 Personal history of transient ischemic attack (TIA), and cerebral infarction without residual deficits: Secondary | ICD-10-CM | POA: Diagnosis not present

## 2014-11-16 DIAGNOSIS — F0390 Unspecified dementia without behavioral disturbance: Secondary | ICD-10-CM | POA: Insufficient documentation

## 2014-11-16 DIAGNOSIS — S0990XA Unspecified injury of head, initial encounter: Secondary | ICD-10-CM | POA: Diagnosis present

## 2014-11-16 DIAGNOSIS — S61402A Unspecified open wound of left hand, initial encounter: Secondary | ICD-10-CM | POA: Insufficient documentation

## 2014-11-16 DIAGNOSIS — Z8781 Personal history of (healed) traumatic fracture: Secondary | ICD-10-CM | POA: Diagnosis not present

## 2014-11-16 DIAGNOSIS — T83511A Infection and inflammatory reaction due to indwelling urethral catheter, initial encounter: Secondary | ICD-10-CM

## 2014-11-16 LAB — URINE MICROSCOPIC-ADD ON

## 2014-11-16 LAB — BASIC METABOLIC PANEL
Anion gap: 11 (ref 5–15)
BUN: 35 mg/dL — AB (ref 6–20)
CALCIUM: 9 mg/dL (ref 8.9–10.3)
CHLORIDE: 87 mmol/L — AB (ref 101–111)
CO2: 40 mmol/L — AB (ref 22–32)
Creatinine, Ser: 0.72 mg/dL (ref 0.44–1.00)
GFR calc Af Amer: >60 mL/min (ref >60)
GFR calc non Af Amer: >60 mL/min (ref >60)
GLUCOSE: 112 mg/dL — AB (ref 65–99)
Potassium: 4 mmol/L (ref 3.5–5.1)
Sodium: 138 mmol/L (ref 135–145)

## 2014-11-16 LAB — CBC WITH DIFFERENTIAL/PLATELET
Basophils Absolute: 0 10*3/uL (ref 0.0–0.1)
Basophils Relative: 0 % (ref 0–1)
EOS PCT: 0 % (ref 0–5)
Eosinophils Absolute: 0 10*3/uL (ref 0.0–0.7)
HEMATOCRIT: 39.5 % (ref 36.0–46.0)
Hemoglobin: 12.9 g/dL (ref 12.0–15.0)
Lymphocytes Relative: 7 % — ABNORMAL LOW (ref 12–46)
Lymphs Abs: 0.8 10*3/uL (ref 0.7–4.0)
MCH: 32.4 pg (ref 26.0–34.0)
MCHC: 32.7 g/dL (ref 30.0–36.0)
MCV: 99.2 fL (ref 78.0–100.0)
MONOS PCT: 7 % (ref 3–12)
Monocytes Absolute: 0.8 10*3/uL (ref 0.1–1.0)
NEUTROS PCT: 86 % — AB (ref 43–77)
Neutro Abs: 9 10*3/uL — ABNORMAL HIGH (ref 1.7–7.7)
PLATELETS: 130 10*3/uL — AB (ref 150–400)
RBC: 3.98 MIL/uL (ref 3.87–5.11)
RDW: 13.6 % (ref 11.5–15.5)
WBC: 10.6 10*3/uL — ABNORMAL HIGH (ref 4.0–10.5)

## 2014-11-16 LAB — URINALYSIS, ROUTINE W REFLEX MICROSCOPIC
Glucose, UA: NEGATIVE mg/dL
KETONES UR: 15 mg/dL — AB
NITRITE: NEGATIVE
Protein, ur: 30 mg/dL — AB
Specific Gravity, Urine: 1.01 (ref 1.005–1.030)
Urobilinogen, UA: 2 mg/dL — ABNORMAL HIGH (ref 0.0–1.0)
pH: 7 (ref 5.0–8.0)

## 2014-11-16 LAB — BRAIN NATRIURETIC PEPTIDE: B Natriuretic Peptide: 343 pg/mL — ABNORMAL HIGH (ref 0.0–100.0)

## 2014-11-16 MED ORDER — BACITRACIN ZINC 500 UNIT/GM EX OINT
TOPICAL_OINTMENT | CUTANEOUS | Status: AC
Start: 2014-11-16 — End: 2014-11-16
  Administered 2014-11-16: 1
  Filled 2014-11-16: qty 0.9

## 2014-11-16 MED ORDER — DEXTROSE 5 % IV SOLN
1.0000 g | Freq: Once | INTRAVENOUS | Status: AC
  Administered 2014-11-16: 1 g via INTRAVENOUS
  Filled 2014-11-16: qty 10

## 2014-11-16 MED ORDER — CIPROFLOXACIN HCL 500 MG PO TABS: 500.0000 mg | ORAL_TABLET | Freq: Two times a day (BID) | ORAL | Status: DC

## 2014-11-16 NOTE — ED Notes (Signed)
Patient's oxygen saturation 81-83% on room air. Patient placed on 3 liters via nasal canula. Oxygen saturation now up to 97-99%

## 2014-11-16 NOTE — Discharge Instructions (Signed)
You were seen today following a fall. He continued to have an oxygen requirement. Oxygen will be ordered for you at rehabilitation. You also have a urinary tract infection and will be given antibiotics. Plain films are only remarkable for a fracture of the screw of your right femur rod.  You can continue rehabilitation per the local orthopedist, Dr. Romeo AppleHarrison.    Fall Prevention in Hospitals As a hospital patient, your condition and the treatments you receive can increase your risk for falls. Some additional risk factors for falls in a hospital include:  Being in an unfamiliar environment.  Being on bed rest.  Your surgery.  Taking certain medicines.  Your tubing requirements, such as intravenous (IV) therapy or catheters. It is important that you learn how to decrease fall risks while at the hospital. Below are important tips that can help prevent falls. SAFETY TIPS FOR PREVENTING FALLS Talk about your risk of falling.  Ask your caregiver why you are at risk for falling. Is it your medicine, illness, tubing placement, or something else?  Make a plan with your caregiver to keep you safe from falls.  Ask your caregiver or pharmacist about side effect of your medicines. Some medicines can make you dizzy or affect your coordination. Ask for help.  Ask for help before getting out of bed. You may need to press your call button.  Ask for assistance in getting you safely to the toilet.  Ask for a walker or cane to be put at your bedside. Ask that most of the side rails on your bed be placed up before your caregiver leaves the room.  Ask family or friends to sit with you.  Ask for things that are out of your reach, such as your glasses, hearing aids, telephone, bedside table, or call button. Follow these tips to avoid falling:  Stay lying or seated, rather than standing, while waiting for help.  Wear rubber-soled slippers or shoes whenever you walk in the hospital.  Avoid quick, sudden  movements.  Change positions slowly.  Sit on the side of your bed before standing.  Stand up slowly and wait before you start to walk.  Let your caregiver know if there is a spill on the floor.  Pay careful attention to the medical equipment, electrical cords, and tubes around you.  When you need help, use your call button by your bed or in the bathroom. Wait for one of your caregivers to help you.  If you feel dizzy or unsure of your footing, return to bed and wait for assistance.  Avoid being distracted by the TV, telephone, or another person in your room.  Do not lean or support yourself on rolling objects, such as IV poles or bedside tables. Document Released: 06/18/2000 Document Revised: 06/07/2012 Document Reviewed: 02/27/2012 [facility]Exit[facility] Patient Information 2015 [place], [place]. This information is not intended to replace advice given to you by your health care provider. Make sure you discuss any questions you have with your health care provider. Urinary Tract Infection Urinary tract infections (UTIs) can develop anywhere along your urinary tract. Your urinary tract is your body's drainage system for removing wastes and extra water. Your urinary tract includes two kidneys, two ureters, a bladder, and a urethra. Your kidneys are a pair of bean-shaped organs. Each kidney is about the size of your fist. They are located below your ribs, one on each side of your spine. CAUSES Infections are caused by microbes, which are microscopic organisms, including fungi, viruses, and bacteria.  These organisms are so small that they can only be seen through a microscope. Bacteria are the microbes that most commonly cause UTIs. SYMPTOMS  Symptoms of UTIs may vary by age and gender of the patient and by the location of the infection. Symptoms in young women typically include a frequent and intense urge to urinate and a painful, burning feeling in the bladder or urethra during urination. Older women and  men are more likely to be tired, shaky, and weak and have muscle aches and abdominal pain. A fever may mean the infection is in your kidneys. Other symptoms of a kidney infection include pain in your back or sides below the ribs, nausea, and vomiting. DIAGNOSIS To diagnose a UTI, your caregiver will ask you about your symptoms. Your caregiver also will ask to provide a urine sample. The urine sample will be tested for bacteria and white blood cells. White blood cells are made by your body to help fight infection. TREATMENT  Typically, UTIs can be treated with medication. Because most UTIs are caused by a bacterial infection, they usually can be treated with the use of antibiotics. The choice of antibiotic and length of treatment depend on your symptoms and the type of bacteria causing your infection. HOME CARE INSTRUCTIONS  If you were prescribed antibiotics, take them exactly as your caregiver instructs you. Finish the medication even if you feel better after you have only taken some of the medication.  Drink enough water and fluids to keep your urine clear or pale yellow.  Avoid caffeine, tea, and carbonated beverages. They tend to irritate your bladder.  Empty your bladder often. Avoid holding urine for long periods of time.  Empty your bladder before and after sexual intercourse.  After a bowel movement, women should cleanse from front to back. Use each tissue only once. SEEK MEDICAL CARE IF:   You have back pain.  You develop a fever.  Your symptoms do not begin to resolve within 3 days. SEEK IMMEDIATE MEDICAL CARE IF:   You have severe back pain or lower abdominal pain.  You develop chills.  You have nausea or vomiting.  You have continued burning or discomfort with urination. MAKE SURE YOU:   Understand these instructions.  Will watch your condition.  Will get help right away if you are not doing well or get worse. Document Released: 03/31/2005 Document Revised:  12/21/2011 Document Reviewed: 07/30/2011 [facility]ExitCare Patient Information 2015 [place] Housing[place], [place]. This information is not intended to replace advice given to you by your health care provider. Make sure you discuss any questions you have with your health care provider.

## 2014-11-16 NOTE — ED Provider Notes (Signed)
CSN: [id number]     Arrival date & time 11/16/14  0159 History   First MD Initiated Contact with Patient 11/16/14 0222     Chief Complaint  Patient presents with  . Fall     (Consider location/radiation/quality/duration/timing/severity/associated sxs/prior Treatment) HPI  This is a 81 year old female with a history of hypertension, aortic valve stenosis, TIA, atrial fibrillation and recent hospitalization for pneumonia and hypoxic respiratory failure who presents from [facility] following a fall. Patient reports that she tried to get out of bed. She is not supposed to ambulate on her own. She fell and hit her head. She is reporting headache, neck, and back pain. She is alert and oriented 2. Unclear of time.  Denies syncope or chest pain. Has an indwelling Foley catheter.  Past Medical History  Diagnosis Date  . Fall     secondary to weakness as of  [place] [facility]  03/26/10  . H/O orthostatic hypotension   . Chest discomfort     anterior  . Hypertension   . Aortic valve stenosis     mild to moderate  . Syncope and collapse 2006  . Arthritis   . GERD (gastroesophageal reflux disease)   . Osteoporosis   . Abnormal EKG   . Scarlet fever   . Scoliosis   . TIA (transient ischemic attack) 1999  . Fracture, humerus     2016  . A-fib     02/2014, not on anticoagulation   . Diastolic CHF 01/30/2014  . Proximal humerus fracture   . On home O2 08/2014    2L N/C  . Anemia   . Thrombocytopenia   . DNR (do not resuscitate) 08/2014  . Malnutrition   . Frequent falls   . Mild dementia    Past Surgical History  Procedure Laterality Date  . Cholecystectomy    . Cataract extraction Bilateral   . Femur im nail Right 12/17/2013    Procedure: INTRAMEDULLARY (IM) NAIL FEMORAL;  Surgeon: Sheral Apleyimothy D Murphy, MD;  Location: [facility] OR;  Service: Orthopedics;  Laterality: Right;   Family History  Problem Relation Age of Onset  . Pneumonia Mother   . Heart failure Father   . Diabetes Father   . Heart  disease Sister    History  Substance Use Topics  . Smoking status: Former Smoker    Quit date: 01/16/1981  . Smokeless tobacco: Never Used  . Alcohol Use: No   OB History    No data available     Review of Systems  Constitutional: Negative for fever.  Respiratory: Negative for chest tightness and shortness of breath.   Cardiovascular: Negative for chest pain.  Gastrointestinal: Negative for nausea, vomiting and abdominal pain.  Genitourinary: Negative for dysuria.  Musculoskeletal: Positive for back pain and neck pain.  Skin: Negative for wound.  Neurological: Positive for headaches. Negative for syncope.  Psychiatric/Behavioral: Negative for confusion.  All other systems reviewed and are negative.     Allergies  Amoxicillin; Doxycycline; Fosamax; Septra; and Tramadol  Home Medications   Prior to Admission medications   Medication Sig Start Date End Date Taking? Authorizing Provider  acetaminophen (TYLENOL) 500 MG tablet Take 500 mg by mouth every 6 (six) hours as needed.    Historical Provider, MD  ALPRAZolam Prudy Feeler(XANAX) 0.5 MG tablet Take one tablet by mouth at bedtime to help rest 11/15/14   Jeralyn BennettEzequiel Zamora, MD  alum & mag hydroxide-simeth (MAALOX/MYLANTA) 200-200-20 MG/5ML suspension Take 30 mLs by mouth 2 (two) times daily as  needed for indigestion.    Historical Provider, MD  amLODipine (NORVASC) 5 MG tablet Take 10 mg by mouth daily.     Historical Provider, MD  bisacodyl (DULCOLAX) 10 MG suppository Place 1 suppository (10 mg total) rectally daily as needed for moderate constipation. 11/15/14   Jeralyn Bennett, MD  calcium carbonate (OS-CAL) 600 MG TABS Take 1,200 mg by mouth daily with breakfast.     Historical Provider, MD  carvedilol (COREG) 3.125 MG tablet Take 1 tablet (3.125 mg total) by mouth 2 (two) times daily. 02/04/14   Antoine Poche, MD  ciprofloxacin (CIPRO) 500 MG tablet Take 1 tablet (500 mg total) by mouth every 12 (twelve) hours. 11/16/14   Shon Baton, MD  docusate sodium (COLACE) 100 MG capsule Take 1 capsule (100 mg total) by mouth 2 (two) times daily. Continue this while taking narcotics to help with bowel movements 12/17/13   Sheral Apley, MD  esomeprazole (NEXIUM) 40 MG capsule Take 40 mg by mouth daily at 12 noon.    Historical Provider, MD  feeding supplement (GLUCERNA SHAKE) LIQD Take 237 mLs by mouth 3 (three) times daily between meals.  01/19/12   Vassie Loll, MD  ferrous sulfate 325 (65 FE) MG tablet Take 1 tablet (325 mg total) by mouth 2 (two) times daily with a meal. 12/19/13   Calvert Cantor, MD  furosemide (LASIX) 20 MG tablet Take 1 tablet (20 mg total) by mouth daily. 11/15/14   Jeralyn Bennett, MD  loratadine (CLARITIN) 10 MG tablet Take 10 mg by mouth daily.    Historical Provider, MD  Multiple Vitamin (MULTIVITAMIN WITH MINERALS) TABS Take 1 tablet by mouth daily.    Historical Provider, MD  pantoprazole (PROTONIX) 40 MG tablet Take 1 tablet (40 mg total) by mouth daily. 11/15/14   Jeralyn Bennett, MD  polyethylene glycol (MIRALAX / GLYCOLAX) packet Take 17 g by mouth daily. 11/15/14   Jeralyn Bennett, MD  Vitamin D, Ergocalciferol, (DRISDOL) 50000 UNITS CAPS capsule Take 50,000 Units by mouth every 7 (seven) days. Take on Friday.    Historical Provider, MD   BP 173/41 mmHg  Pulse 78  Temp(Src) 97.8 F (36.6 C) (Oral)  Resp 17  Ht 4\' 8"  (1.422 m)  Wt 86 lb (39.009 kg)  BMI 19.29 kg/m2  SpO2 100% Physical Exam  Constitutional: She is oriented to person, place, and time. No distress.  Elderly  HENT:  Head: Normocephalic.  Large abrasion and hematoma over the forehead right at the scalp line approximately 3 cm in diameter, no obvious laceration  Eyes: Pupils are equal, round, and reactive to light.  Neck:  C collar in place  Cardiovascular: Normal rate, regular rhythm and normal heart sounds.   Pulmonary/Chest: Effort normal and breath sounds normal. No respiratory distress. She has no wheezes.  Abdominal:  Soft. Bowel sounds are normal. There is no tenderness. There is no rebound.  Musculoskeletal:  Contractured lower extremities, no obvious deformities  Neurological: She is alert and oriented to person, place, and time.  Skin: Skin is warm and dry.  Multiple areas over the bilateral upper and lower extremities with contusion and skin tear  Psychiatric: She has a normal mood and affect.  Nursing note and vitals reviewed.   ED Course  Procedures (including critical care time) Labs Review Labs Reviewed  URINALYSIS, ROUTINE W REFLEX MICROSCOPIC - Abnormal; Notable for the following:    Hgb urine dipstick MODERATE (*)    Bilirubin Urine SMALL (*)  Ketones, ur 15 (*)    Protein, ur 30 (*)    Urobilinogen, UA 2.0 (*)    Leukocytes, UA LARGE (*)    All other components within normal limits  URINE MICROSCOPIC-ADD ON - Abnormal; Notable for the following:    Bacteria, UA MANY (*)    All other components within normal limits  CBC WITH DIFFERENTIAL/PLATELET - Abnormal; Notable for the following:    WBC 10.6 (*)    Platelets 130 (*)    Neutrophils Relative % 86 (*)    Neutro Abs 9.0 (*)    Lymphocytes Relative 7 (*)    All other components within normal limits  BASIC METABOLIC PANEL - Abnormal; Notable for the following:    Chloride 87 (*)    CO2 40 (*)    Glucose, Bld 112 (*)    BUN 35 (*)    All other components within normal limits  BRAIN NATRIURETIC PEPTIDE - Abnormal; Notable for the following:    B Natriuretic Peptide 343.0 (*)    All other components within normal limits  URINE CULTURE    Imaging Review Dg Lumbar Spine Complete  11/16/2014   CLINICAL DATA:  Patient was found lying on the floor beside bed.  EXAM: LUMBAR SPINE - COMPLETE 4+ VIEW  COMPARISON:  01/31/2014  FINDINGS: Diffuse bone demineralization. Scoliosis of the lumbar spine convex towards the left. Diffuse degenerative changes throughout the lumbar spine. Compression deformities of T12 and L2. No change since  previous study. Slight anterior subluxation of L5 on S1 is also unchanged. Prominent vascular calcifications. Surgical clips in the right upper quadrant. Calcification in the right upper quadrant may represent gallstone or renal stone. No change since prior study.  IMPRESSION: Diffuse bone demineralization. Degenerative changes and scoliosis of the lumbar spine. Old compression deformities of T12 and L2.   Electronically Signed   By: Burman Nieves M.D.   On: 11/16/2014 04:33   Dg Pelvis 1-2 Views  11/16/2014   CLINICAL DATA:  Patient was found lying on the floor beside the bed. Complains of pain in the head, neck, and back.  EXAM: PELVIS - 1-2 VIEW  COMPARISON:  Abdomen 11/11/2014.  Pelvis 02/04/2014.  FINDINGS: Postoperative internal fixation of the non healing fracture of the right proximal femur. See additional discussion of right femur same date. Diffuse bone demineralization. No evidence of acute fracture or dislocation of the pelvis. SI joint and symphysis pubis are not displaced. Degenerative changes in the lower lumbar spine.  IMPRESSION: No acute displaced fractures of the pelvis identified. Internal fixation of an old fracture of the right proximal femur.   Electronically Signed   By: Burman Nieves M.D.   On: 11/16/2014 04:31   Ct Head Wo Contrast  11/16/2014   CLINICAL DATA:  Found down on floor, yelling. Head and neck pain. Forehead abrasion. History of thrombocytopenia, frequent falls, mild dementia.  EXAM: CT HEAD WITHOUT CONTRAST  CT CERVICAL SPINE WITHOUT CONTRAST  TECHNIQUE: Multidetector CT imaging of the head and cervical spine was performed following the standard protocol without intravenous contrast. Multiplanar CT image reconstructions of the cervical spine were also generated.  COMPARISON:  CT of the head December 19, 2014  FINDINGS: CT HEAD FINDINGS  The ventricles and sulci are normal for age. No intraparenchymal hemorrhage, mass effect nor midline shift. Patchy supratentorial white  matter hypodensities are less than expected for patient's age and though non-specific suggest sequelae of chronic small vessel ischemic disease. No acute large vascular territory infarcts.  No abnormal extra-axial fluid collections. Basal cisterns are patent. Moderate calcific atherosclerosis of the carotid siphons.  No skull fracture. Patient is osteopenic. Small to moderate frontal scalp hematoma with minimal subcutaneous gas, no radiopaque foreign bodies The included ocular globes and orbital contents are non-suspicious. Bilateral ocular lens implants. Persistent LEFT fronto ethmoid soft tissue opacification without paranasal sinus air-fluid levels. The mastoid air cells are well aerated.  CT CERVICAL SPINE FINDINGS  Cervical vertebral bodies intact. Grade 1 C4-5 anterolisthesis without spondylolysis ; the C4-5 facets are fused bilaterally on degenerative basis. Moderate C4-5, mild C5-6 and C6-7 disc height loss, marginal spurring consistent with degenerative disc. Severe C2-3 and C3-4 facet arthropathy. C1-2 articulation maintained. No destructive bony lesions. Patient is osteopenic.  Mild chronic appearing wedging of vertebral body T4. Mild biapical fibronodular scarring.  IMPRESSION: CT HEAD: Small to moderate frontal scalp hematoma and suspected laceration. No skull fracture.  No acute intracranial process ; normal noncontrast CT of the head for age.  CT CERVICAL SPINE: No acute osseous process. Grade 1 C4-5 anterolisthesis on degenerative basis.  Osteopenia.   Electronically Signed   By: Awilda Metroourtnay  Bloomer   On: 11/16/2014 03:27   Ct Cervical Spine Wo Contrast  11/16/2014   CLINICAL DATA:  Found down on floor, yelling. Head and neck pain. Forehead abrasion. History of thrombocytopenia, frequent falls, mild dementia.  EXAM: CT HEAD WITHOUT CONTRAST  CT CERVICAL SPINE WITHOUT CONTRAST  TECHNIQUE: Multidetector CT imaging of the head and cervical spine was performed following the standard protocol without  intravenous contrast. Multiplanar CT image reconstructions of the cervical spine were also generated.  COMPARISON:  CT of the head December 19, 2014  FINDINGS: CT HEAD FINDINGS  The ventricles and sulci are normal for age. No intraparenchymal hemorrhage, mass effect nor midline shift. Patchy supratentorial white matter hypodensities are less than expected for patient's age and though non-specific suggest sequelae of chronic small vessel ischemic disease. No acute large vascular territory infarcts.  No abnormal extra-axial fluid collections. Basal cisterns are patent. Moderate calcific atherosclerosis of the carotid siphons.  No skull fracture. Patient is osteopenic. Small to moderate frontal scalp hematoma with minimal subcutaneous gas, no radiopaque foreign bodies The included ocular globes and orbital contents are non-suspicious. Bilateral ocular lens implants. Persistent LEFT fronto ethmoid soft tissue opacification without paranasal sinus air-fluid levels. The mastoid air cells are well aerated.  CT CERVICAL SPINE FINDINGS  Cervical vertebral bodies intact. Grade 1 C4-5 anterolisthesis without spondylolysis ; the C4-5 facets are fused bilaterally on degenerative basis. Moderate C4-5, mild C5-6 and C6-7 disc height loss, marginal spurring consistent with degenerative disc. Severe C2-3 and C3-4 facet arthropathy. C1-2 articulation maintained. No destructive bony lesions. Patient is osteopenic.  Mild chronic appearing wedging of vertebral body T4. Mild biapical fibronodular scarring.  IMPRESSION: CT HEAD: Small to moderate frontal scalp hematoma and suspected laceration. No skull fracture.  No acute intracranial process ; normal noncontrast CT of the head for age.  CT CERVICAL SPINE: No acute osseous process. Grade 1 C4-5 anterolisthesis on degenerative basis.  Osteopenia.   Electronically Signed   By: Awilda Metroourtnay  Bloomer   On: 11/16/2014 03:27   Dg Chest Portable 1 View  11/16/2014   CLINICAL DATA:  Fall with  forehead abrasion.  EXAM: PORTABLE CHEST - 1 VIEW  COMPARISON:  11/14/2014  FINDINGS: Cardiac enlargement without vascular congestion. Calcified and tortuous aorta. Emphysematous changes and fibrosis in the lungs. No focal consolidation or airspace disease. No blunting of costophrenic angles. Old  fracture deformity of the right proximal humerus. No change since prior study.  IMPRESSION: No active disease.   Electronically Signed   By: Burman Nieves M.D.   On: 11/16/2014 06:08   Dg Chest Port 1 View  11/14/2014   CLINICAL DATA:  Aspiration into respiratory tract. Acute respiratory failure with hypoxia. Congestive heart failure. Acute renal failure.  EXAM: PORTABLE CHEST - 1 VIEW  COMPARISON:  11/11/2014  FINDINGS: Moderate severe cardiomegaly and ectasia of the thoracic aorta remains stable. Coarsening of interstitial lung markings appears chronic. No evidence of acute infiltrate or pleural effusion. Several old left rib fracture deformities again noted, as well as a incompletely healed, subacute fracture of the right humeral neck.  IMPRESSION: Stable cardiomegaly and thoracic aortic ectasia. No acute lung disease.  Subacute incompletely healed right humeral neck fracture again noted.   Electronically Signed   By: Myles Rosenthal M.D.   On: 11/14/2014 09:51   Dg Hip Unilat With Pelvis 2-3 Views Right  11/16/2014   CLINICAL DATA:  Fall.  EXAM: RIGHT HIP (WITH PELVIS) 2-3 VIEWS  COMPARISON:  Abdomen 11/11/2014.  Right femur 02/04/2014  FINDINGS: Postoperative changes with intra medullary rod and screw fixation of the right femur. There is an oblique fracture of the proximal right femoral shaft and intertrochanteric region around the intra medullary rod. This has been present previously consistent with old ununited fracture. Since the previous study, there is fracture of the distal locking screw of the intra medullary rod. Diffuse bone demineralization. Degenerative changes in the hip and knee. Vascular  calcifications.  IMPRESSION: Intra medullary rod fixation of an old ununited inter trochanteric and sub trochanteric fracture of the right hip. Fracture of the distal locking screw is new since previous study.   Electronically Signed   By: Burman Nieves M.D.   On: 11/16/2014 04:30     EKG Interpretation   Date/Time:  Saturday Nov 16 2014 02:48:02 EDT Ventricular Rate:  70 PR Interval:  55 QRS Duration: 100 QT Interval:  431 QTC Calculation: 465 R Axis:   -54 Text Interpretation:  Sinus rhythm Inferior infarct, age indeterminate No  significant change since last tracing Confirmed by Brekyn Huntoon  MD, Aurie Harroun  [id number]) on 11/16/2014 2:51:49 AM      MDM   Final diagnoses:  Fall  Scalp abrasion, initial encounter  Hypoxia  Urinary tract infection associated with catheterization of urinary tract, initial encounter   Patient presents following a fall. Reports mechanical fall. Has an abrasion to her for head and reports head, neck, and back pain. Chronically ill-appearing.  Per the patient's daughter, she mostly gets around in her wheelchair.  CT head and neck and plain films of the back and pelvis were obtained. Urinalysis taken from Foley catheter with numerous white blood cells.  5:00 AM Told by nursing, patient hypoxic to 81% on room air. Not on oxygen at [facility]. Was recently admitted for hypoxic respiratory failure and CHF. Chest x-ray and basic labwork added to patient workup.  Chest x-ray clear. No evidence of volume overload. Patient does have evidence of urinary tract infection. Currently with an indwelling Foley. Patient given IV Rocephin and urine culture sent. Mild leukocytosis.  Patient also noted on x-rays to have a broken distal screw of her right femur rod. Initial surgery done by Dr. Eulah Pont. Discussed with Dr. Romeo Apple. Patient has no skin tenting or tenderness over the site. This should not preclude her from continuing rehabilitation per Dr. Romeo Apple.  Given hypoxia and  UTI,  discussed with hospitalist. In discussion with Dr. Conley Rolls, feel it is reasonable to discharge back to [facility] with instructions for 3 L of oxygen and ciprofloxacin for urinary tract infection.  If patient worsens or there are unable to accommodate her O2 requirement and she returns, patient will be admitted.  Shon Baton, MD 11/16/14 [id number]

## 2014-11-16 NOTE — ED Notes (Signed)
Patient pulled off of oxygen to assess oxygen saturation. Oxygen 81% on room air. Dr. Wilkie AyeHorton notified. Patient placed back 3 liters of oxygen via nasal canula.

## 2014-11-16 NOTE — ED Notes (Signed)
Patient is a new resident at [facility]. Patient was found lying in floor beside bed hollering. Patient complaining of pain to head, neck, and back. Hematoma with dried blood and abrasions noted to forehead.

## 2014-11-17 ENCOUNTER — Non-Acute Institutional Stay (SKILLED_NURSING_FACILITY): Payer: Medicare Other | Admitting: Internal Medicine

## 2014-11-17 DIAGNOSIS — I5030 Unspecified diastolic (congestive) heart failure: Secondary | ICD-10-CM | POA: Diagnosis not present

## 2014-11-17 DIAGNOSIS — R110 Nausea: Secondary | ICD-10-CM | POA: Diagnosis not present

## 2014-11-17 DIAGNOSIS — J9622 Acute and chronic respiratory failure with hypercapnia: Secondary | ICD-10-CM | POA: Diagnosis not present

## 2014-11-17 DIAGNOSIS — R1314 Dysphagia, pharyngoesophageal phase: Secondary | ICD-10-CM | POA: Diagnosis not present

## 2014-11-17 NOTE — Progress Notes (Signed)
Patient ID: Briana Schultz, female   DOB: 10/30/1912, 81 y.o.   MRN: [id number]    Facility; [facility] Chief complaint; readmission to the facility post stay at [facility] 5/6-5/13. Story; this is a frail patient who was here in the summer of 2015 after having a hip fracture. She came back to [place] this year after her right humerus fracture, she was taken home by her family but returned to the facility when they can no longer meet her needs. She was sent to the hospital this time after falling out of bed and hitting her head. She was noted to be increasingly lethargic and apparently had vomited prior to admission. She was found to be hypoxic by EMS and they reported that she was cyanotic. She was felt to be in CHF in the emergency room. Her BNP was elevated above previous levels. She was felt to have aspiration pneumonia also given intravenous Lasix. She required BiPAP. She was given empiric IV antibiotics. However repeat x-ray showed no acute lung disease. He was also noted that she had frequent complaints of nausea and at some point it was noted that she had not had a bowel movement in 4-5 days. After she had a bowel movement her intake apparently improved.  Her initial blood gases showed an acute respiratory acidosis with a PCO2 of over 90. As mentioned she did receive BiPAP and her last arterial blood gas showed a PCO2 in the high 70s but this was compensated.  Lab Results  Component Value Date   CREATININE 0.72 11/16/2014   CREATININE 0.67 11/15/2014   CREATININE 0.69 11/14/2014    CBC Latest Ref Rng 11/16/2014 11/14/2014 11/11/2014  WBC 4.0 - 10.5 K/uL 10.6(H) 7.4 9.9  Hemoglobin 12.0 - 15.0 g/dL 57.8 46.9 62.9  Hematocrit 36.0 - 46.0 % 39.5 40.5 39.7  Platelets 150 - 400 K/uL 130(L) 146(L) 134(L)     Results for MARLON, VONRUDEN (MRN [id number]) as of 11/17/2014 11:09  Ref. Range 11/08/2014 19:04 11/08/2014 23:36 11/09/2014 10:25 11/10/2014 05:07  Sample type Unknown ARTERIAL ARTERIAL DRAW ARTERIAL  ARTERIAL DRAW  Delivery systems Unknown NASAL CANNULA NASAL CANNULA NASAL CANNULA NASAL CANNULA  FIO2 Latest Units: % 0.32 0.28  0.28  O2 Content Latest Units: L/min   2.0   pH, Arterial Latest Ref Range: 7.350-7.450  7.219 (L) 7.239 (L) 7.337 (L) 7.380  pCO2 arterial Latest Ref Range: 35.0-45.0 mmHg 92.9 (HH) 86.4 (HH) 73.7 (HH) 77.5 (HH)  pO2, Arterial Latest Ref Range: 80.0-100.0 mmHg 62.8 (L) 81.7 72.0 (L) 78.6 (L)  Bicarbonate Latest Ref Range: 20.0-24.0 mEq/L 36.5 (H) 35.7 (H) 38.4 (H) 44.8 (H)  TCO2 Latest Ref Range: 0-100 mmol/L 27.9 33.6 35.3 41.1  Acid-Base Excess Latest Ref Range: 0.0-2.0 mmol/L 8.9 (H) 8.4 (H) 12.2 (H) 18.6 (H)  O2 Saturation Latest Units: % 88.1 94.2 93.5 95.1  Patient temperature Unknown 37.0 37.0 37.0 37.0  Collection site Unknown BRACHIAL ARTERY RIGHT RADIAL RIGHT RADIAL LEFT RADIAL  Allens test (pass/fail) Latest Ref Range: PASS  NOT INDICATED (A) PASS PASS PASS    Past Medical History  Diagnosis Date  . Fall     secondary to weakness as of  [place] [facility]  03/26/10  . H/O orthostatic hypotension   . Chest discomfort     anterior  . Hypertension   . Aortic valve stenosis     mild to moderate  . Syncope and collapse 2006  . Arthritis   . GERD (gastroesophageal reflux disease)   . Osteoporosis   .  Abnormal EKG   . Scarlet fever   . Scoliosis   . TIA (transient ischemic attack) 1999  . Fracture, humerus     2016  . A-fib     02/2014, not on anticoagulation   . Diastolic CHF 01/30/2014  . Proximal humerus fracture   . On home O2 08/2014    2L N/C  . Anemia   . Thrombocytopenia   . DNR (do not resuscitate) 08/2014  . Malnutrition   . Frequent falls   . Mild dementia    Past Surgical History  Procedure Laterality Date  . Cholecystectomy    . Cataract extraction Bilateral   . Femur im nail Right 12/17/2013    Procedure: INTRAMEDULLARY (IM) NAIL FEMORAL;  Surgeon: Sheral Apleyimothy D Murphy, MD;  Location: [facility] OR;  Service: Orthopedics;  Laterality: Right;     Current Outpatient Prescriptions on File Prior to Visit  Medication Sig Dispense Refill  . acetaminophen (TYLENOL) 500 MG tablet Take 500 mg by mouth every 6 (six) hours as needed.    . ALPRAZolam (XANAX) 0.5 MG tablet Take one tablet by mouth at bedtime to help rest 20 tablet 0  . alum & mag hydroxide-simeth (MAALOX/MYLANTA) 200-200-20 MG/5ML suspension Take 30 mLs by mouth 2 (two) times daily as needed for indigestion.    Marland Kitchen. amLODipine (NORVASC) 5 MG tablet Take 10 mg by mouth daily.     . bisacodyl (DULCOLAX) 10 MG suppository Place 1 suppository (10 mg total) rectally daily as needed for moderate constipation. 12 suppository 0  . calcium carbonate (OS-CAL) 600 MG TABS Take 1,200 mg by mouth daily with breakfast.     . carvedilol (COREG) 3.125 MG tablet Take 1 tablet (3.125 mg total) by mouth 2 (two) times daily. 180 tablet 3  . ciprofloxacin (CIPRO) 500 MG tablet Take 1 tablet (500 mg total) by mouth every 12 (twelve) hours. 10 tablet 0  . docusate sodium (COLACE) 100 MG capsule Take 1 capsule (100 mg total) by mouth 2 (two) times daily. Continue this while taking narcotics to help with bowel movements 30 capsule 1  . esomeprazole (NEXIUM) 40 MG capsule Take 40 mg by mouth daily at 12 noon.    . feeding supplement (GLUCERNA SHAKE) LIQD Take 237 mLs by mouth 3 (three) times daily between meals.     . ferrous sulfate 325 (65 FE) MG tablet Take 1 tablet (325 mg total) by mouth 2 (two) times daily with a meal.  3  . furosemide (LASIX) 20 MG tablet Take 1 tablet (20 mg total) by mouth daily. 30 tablet 1  . loratadine (CLARITIN) 10 MG tablet Take 10 mg by mouth daily.    . Multiple Vitamin (MULTIVITAMIN WITH MINERALS) TABS Take 1 tablet by mouth daily.    . pantoprazole (PROTONIX) 40 MG tablet Take 1 tablet (40 mg total) by mouth daily. 30 tablet 1  . polyethylene glycol (MIRALAX / GLYCOLAX) packet Take 17 g by mouth daily. 14 each 0  . Vitamin D, Ergocalciferol, (DRISDOL) 50000 UNITS CAPS  capsule Take 50,000 Units by mouth every 7 (seven) days. Take on Friday.      Social history; patient has become an ongoing resident here. She is very frail. There are no advanced directives.  family history includes Diabetes in her father; Heart disease in her sister; Heart failure in her father; Pneumonia in her mother.  Review of systems HEENT; reports of throat pain and some difficulty swallowing Respiratory; no shortness of breath or cough reported Cardiac  left lateral chest pain GI again reports of upper chest/cervical area dysphagia. Eating 33% of her meals per her granddaughter who is present.  Physical examination Gen.; patient appears very frail somewhat tachypneic Vitals; O2 sat is 91-92% on 3 L. Respiratory rate 28 pulse rate 76 HEENT; I see no obvious problems in her posterior pharynx. Slightly red and irritated but no evidence of thrush or other concerns Respiratory coarse upper airway sounds very shallow air entry. Very significant thoracic kyphosis. Cardiac; heart sounds are distant JVP is not elevated. Some tenderness elicited elicited on the left lateral ribs guarded and Abdomen; somewhat distended no liver no spleen no overt tenderness. No liver no spleen GU no suprapubic fullness or tenderness no costovertebral angle tenderness Extremities; severe bilateral venous stasis  Skin small traumatic wound on the left anterior leg/venous stasis disease. She has a large foam dressing over her coccyx however I see no wound here. This was dated 5/6     Impression/plan #1 acute on chronic respiratory failure. The exact cause of this is unclear it was suspected she had CHF and/or aspiration pneumonitis. Her chest x-rays really didn't change all that much and I have reviewed all of them. The exact cause of this isn't really clear to me. I think the most likely thing would be acute on chronic respiratory failure secondary to aspiration on top of a severe kyphosis. Heart failure here  didn't really seem overwhelming #2 diastolic dysfunction with acute on chronic diastolic heart failure. This was not evident radiographically. Her BNP was over 1300 which is apparently slightly over her previous values. #3 her granddaughter findings are somewhat lethargic, her neurologic exam is non-lateralizing. This was attributed to Phenergan which was stopped. #4 continued complaints of nausea but no vomiting. She remains on protonix. I wonder about the iron which some people find very nauseating. I may actually stop this. #5 dysphagia; she points to her cervical area in the front for this. She may need an upper GI evaluation

## 2014-11-18 ENCOUNTER — Non-Acute Institutional Stay (SKILLED_NURSING_FACILITY): Payer: Medicare Other | Admitting: Internal Medicine

## 2014-11-18 ENCOUNTER — Encounter (HOSPITAL_COMMUNITY)
Admission: RE | Admit: 2014-11-18 | Discharge: 2014-11-18 | Disposition: A | Discharge status: Home / Self Care | Payer: Medicare Other | Source: Skilled Nursing Facility | Attending: Internal Medicine | Admitting: Internal Medicine

## 2014-11-18 DIAGNOSIS — R110 Nausea: Secondary | ICD-10-CM | POA: Diagnosis not present

## 2014-11-18 DIAGNOSIS — J9622 Acute and chronic respiratory failure with hypercapnia: Secondary | ICD-10-CM | POA: Diagnosis not present

## 2014-11-18 LAB — BASIC METABOLIC PANEL
Anion gap: 11 (ref 5–15)
BUN: 16 mg/dL (ref 6–20)
CHLORIDE: 88 mmol/L — AB (ref 101–111)
CO2: 39 mmol/L — AB (ref 22–32)
Calcium: 9.3 mg/dL (ref 8.9–10.3)
Creatinine, Ser: 0.64 mg/dL (ref 0.44–1.00)
GFR calc Af Amer: >60 mL/min (ref >60)
GFR calc non Af Amer: >60 mL/min (ref >60)
GLUCOSE: 194 mg/dL — AB (ref 65–99)
Potassium: 3.5 mmol/L (ref 3.5–5.1)
Sodium: 138 mmol/L (ref 135–145)

## 2014-11-18 LAB — CBC WITH DIFFERENTIAL/PLATELET
Basophils Absolute: 0 10*3/uL (ref 0.0–0.1)
Basophils Relative: 0 % (ref 0–1)
Eosinophils Absolute: 0.2 10*3/uL (ref 0.0–0.7)
Eosinophils Relative: 2 % (ref 0–5)
HCT: 42.9 % (ref 36.0–46.0)
Hemoglobin: 13.9 g/dL (ref 12.0–15.0)
Lymphocytes Relative: 7 % — ABNORMAL LOW (ref 12–46)
Lymphs Abs: 0.8 10*3/uL (ref 0.7–4.0)
MCH: 32.2 pg (ref 26.0–34.0)
MCHC: 32.4 g/dL (ref 30.0–36.0)
MCV: 99.3 fL (ref 78.0–100.0)
Monocytes Absolute: 0.8 10*3/uL (ref 0.1–1.0)
Monocytes Relative: 8 % (ref 3–12)
Neutro Abs: 8.5 10*3/uL — ABNORMAL HIGH (ref 1.7–7.7)
Neutrophils Relative %: 83 % — ABNORMAL HIGH (ref 43–77)
Platelets: 183 10*3/uL (ref 150–400)
RBC: 4.32 MIL/uL (ref 3.87–5.11)
RDW: 13.5 % (ref 11.5–15.5)
WBC: 10.2 10*3/uL (ref 4.0–10.5)

## 2014-11-18 LAB — URINE CULTURE: Colony Count: 75000

## 2014-11-19 ENCOUNTER — Telehealth (HOSPITAL_COMMUNITY): Payer: Self-pay

## 2014-11-19 ENCOUNTER — Non-Acute Institutional Stay (SKILLED_NURSING_FACILITY): Payer: Medicare Other | Admitting: Internal Medicine

## 2014-11-19 ENCOUNTER — Encounter: Payer: Self-pay | Admitting: Internal Medicine

## 2014-11-19 DIAGNOSIS — S72001P Fracture of unspecified part of neck of right femur, subsequent encounter for closed fracture with malunion: Secondary | ICD-10-CM

## 2014-11-19 DIAGNOSIS — N10 Acute tubulo-interstitial nephritis: Secondary | ICD-10-CM

## 2014-11-19 NOTE — ED Notes (Signed)
Post ED Visit - Positive Culture Follow-up  Culture report reviewed by antimicrobial stewardship pharmacist: []  Wes Dulaney, Pharm.D., BCPS [x]  Celedonio MiyamotoJeremy Frens, [address] BCPS []  Georgina PillionElizabeth Martin, [address] BCPS []  Santa Rita RanchMinh Pham, [address]Ph[address]., BCPS, AAHIVP []  Estella HuskMichelle Turner, Pharm.D., BCPS, AAHIVP []  Elder CyphersLorie Poole, [address] BCPS  Positive urine culture Treated with cipro, organism sensitive to the same and no further patient follow-up is required at this time.  Ashley JacobsFesterman, Ladonya Jerkins C 11/19/2014, 11:37 AM

## 2014-11-19 NOTE — Progress Notes (Signed)
Patient ID: Dow AdolphHallie M Shader, female   DOB: 03/29/1913, 81 y.o.   MRN: [id number]   This is an acute visit.  Level care skilled.  [facility].  Complaint-acute visit follow-up ER visit-with hip x-ray showing fracture of distal locking screw of a previous hip repair.  History of present illness.  Patient is a 81 year old female who is familiar to our service-she has had frequent admissions to the facility most recently she was admitted the facility after hospitalization for acute on chronic respiratory failure-somewhat unclear etiology-she now is on oxygen.  She apparently felt the facility about 3 days ago and hit her head-workup in the ER did not show any acute process CT scan of the head did not show any acute changes neck x-rays as well were unchanged.  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  Only change was a fracture of the distal locking screw that was new since the previous study.  She is not really complaining of any increased hip pain here-per ER evaluation and orthopedic consultation-it was thought this did not preclude limiting her activities-again she is largely nonambulatory secondary to significant debility and weakness  Note apparently her white count was somewhat elevated in the ER as well --thought she had a UTI she was put on ciprofloxacin empirically-and I have reviewed the culture and  did grow out 75,000 colonies of Escherichia coli this is sensitive to the Cipro  Family medical social history is been initial note on 11/18/2014.  Medications have been reviewed per MAR.  Review of systems.  Somewhat limited since patient is a somewhat poor historian.  In general she has been afebrile does not complain of chills.  Respiratory history of respiratory failure but does not complain specifically of shortness of breath or increased cough today.  Cardiac does not complaining of any.  GI  does not complain of any abdominal discomfort nausea or vomiting.  GU-does not complaining of dysuria she did have a Foley catheter at one point but this has been removed  Musculoskeletal has significant debility and weakness generalized she's not really complaining of hip pain however.  Neurologic does not complain of dizziness or headache.  Psych she does appear to have some history of anxiety is anxious states like to get back home as soon as possible.  Physical exam.  Temperature is 97.3 pulse 88 respirations 20 blood pressure 149/60-132/48-in this range.  In general this is a very frail elderly female in no distress she appears to be comfortable in bed but somewhat anxious.  Skin is warm and dry she does have numerous solar induced changes which appear to be relatively unchanged.  She has a small hematoma right side of her forehead near the scalp line I do not see signs of infection however  Heart is regular rate and rhythm I do not note significant lower extremity edema.  Her pedal pulses are intact.  Chest is clear to auscultation but quite shallow air entry she is quite K phonic there is no labored breathing.  Abdomen is somewhat distended soft does not appear to be acutely tender there are positive bowel sounds.  Musculoskeletal is able to move all extremities 4 I did do gentle passive range of motion of her right hip I did not really note any acute deformity here --there is a well-healed surgical scar I did not note any erythema or edema or significant pain when I palpated hip area.  Neurologic appears to be grossly intact her speech is clear.  Psych she is oriented to self again appears to be somewhat anxious which can be the case at times.  Labs.  11/18/2014.  Sodium 138 potassium 3.5 BUN 16 creatinine 0.65.  WBC 10.2 hemoglobin 13.9 platelets 183  Assessment and plan.  #1-history of fall-as noted above appears workup was largely negative-there was a change in  the hip x-ray as noted above per orthopedics this should not really affect her activity-at this point will monitor.  Do note she was diagnosed with a possible UTI she is on Cipro which actually is effective against the Escherichia coli.  #2-leukocytosis lab done yesterday shows essentially resolution of this at this point with monitor she is not febrile appears to be stable in this regards.  Chest x-ray in the ER did not show any active disease.  [id number]-of note greater than 30 minutes spent assessing patient-reviewing hospital ER records-discussing her status with her daughter at bedside-and coordinating and formulating  plan of care-of note greater than 50% of time spent coordinating plan of care

## 2014-11-22 NOTE — Progress Notes (Addendum)
Patient ID: Briana Schultz, female   DOB: 07/09/1912, 81 y.o.   MRN: [id number]                PROGRESS NOTE  DATE:  11/18/2014           FACILITY: [facility]               LEVEL OF CARE:   SNF   Acute Visit                  CHIEF COMPLAINT:  Follow up readmission to the facility yesterday.      HISTORY OF PRESENT ILLNESS:  This is a frail patient who was in hospital with acute on chronic respiratory failure, felt to be secondary to CHF and/or aspiration pneumonia.  She required BiPAP.  Her x-rays really did not show any acute changes, perhaps interstitial disease.     Also noted to have frequent complaints of nausea, felt to be secondary to constipation.    She is generally not doing very well.  She will not eat, a few sips of something to drink.   She apparently saw Speech Therapy in the hospital and they felt her swallowing was adequate, although I do not have these results.    LABORATORY DATA:   Lab work from this morning shows:    Basic metabolic panel:  Normal except for a carbon dioxide level of 39.    CBC:  Normal other than a slight neutrophilia at 83%.  Her white count is 10.2.     REVIEW OF SYSTEMS:    CHEST/RESPIRATORY:  The patient does not complain of shortness of breath.   CARDIAC:  No clear chest pain.   GI:  States she continues to feel unrelentingly nauseated.  When she attempts to swallow, things get stuck and she points to her upper sternal area/lower cervical area.    PHYSICAL EXAMINATION:   VITAL SIGNS:     PULSE:  92.   RESPIRATIONS:  28.      02 SATURATIONS:  95% on 3 L.      GENERAL APPEARANCE:  The patient is not in overt distress, although she is tachypneic with a respiratory rate of 28.   CHEST/RESPIRATORY:  Very shallow, but otherwise clear.                CARDIOVASCULAR:   CARDIAC:  No convincing evidence of congestive heart failure.   GASTROINTESTINAL:   LIVER/SPLEEN/KIDNEYS:  No liver, no spleen.  No tenderness.      ASSESSMENT/PLAN:                            Unrelenting nausea.  The patient is not eating and drinking.   An x-ray of her abdomen in the hospital showed no evidence of obstruction.  Indeed, she does not seem to have any clinical abdominal findings at all.  I wonder whether this is a dysphagia problem.  If this continues, the only thing I can think of to do would be an esophagram to make sure she does not have an esophageal stricture.  [facility].    Acute on chronic respiratory failure.  I think the chronic part of this may be a severe thoracic kyphosis.  She is tachypneic.  Her air entry is shallow, but otherwise clear.   I wonder if this is extrapulmonary cage compression.  I have spent some time talking to the patient's daughter about this.  I stopped her iron yesterday, thinking that perhaps this has had something to do with this.  I will speak to the speech therapist.  The only thing I can think of other than this is either an endoscopy or an upper GI series, neither one of which her daughter thinks she would be able to tolerate.

## 2014-11-28 ENCOUNTER — Telehealth: Payer: Self-pay | Admitting: Orthopedic Surgery

## 2014-11-28 NOTE — Telephone Encounter (Signed)
Call received from patient's daughter/POA, Alejandro MullingGlenda Angel, called regarding the recently cancelled appointment 11/12/14, due to patient being in-patient at [facility].  Asking if patient still needs to re-schedule for this shoulder follow up; states she is back at [facility].  Please advise. Ms. Wayland Salinasngel's ph# is [phone]

## 2014-11-29 NOTE — Telephone Encounter (Signed)
Cancel follow up.

## 2014-12-03 NOTE — Telephone Encounter (Signed)
Called back to [name], relayed via voice message, do not need to re-schedule follow up per Dr Romeo AppleHarrison.

## 2014-12-04 ENCOUNTER — Encounter: Payer: Self-pay | Admitting: Internal Medicine

## 2014-12-04 ENCOUNTER — Non-Acute Institutional Stay (SKILLED_NURSING_FACILITY): Payer: Medicare Other | Admitting: Internal Medicine

## 2014-12-04 ENCOUNTER — Encounter (HOSPITAL_COMMUNITY)
Admission: AD | Admit: 2014-12-04 | Discharge: 2014-12-04 | Disposition: A | Discharge status: Home / Self Care | Payer: Medicare Other | Source: Skilled Nursing Facility | Attending: Internal Medicine | Admitting: Internal Medicine

## 2014-12-04 DIAGNOSIS — I5032 Chronic diastolic (congestive) heart failure: Secondary | ICD-10-CM

## 2014-12-04 DIAGNOSIS — E876 Hypokalemia: Secondary | ICD-10-CM | POA: Diagnosis not present

## 2014-12-04 LAB — BASIC METABOLIC PANEL
Anion gap: 9 (ref 5–15)
BUN: 20 mg/dL (ref 6–20)
CHLORIDE: 96 mmol/L — AB (ref 101–111)
CO2: 38 mmol/L — ABNORMAL HIGH (ref 22–32)
CREATININE: 0.57 mg/dL (ref 0.44–1.00)
Calcium: 9.1 mg/dL (ref 8.9–10.3)
GFR calc Af Amer: >60 mL/min (ref >60)
GFR calc non Af Amer: >60 mL/min (ref >60)
GLUCOSE: 117 mg/dL — AB (ref 65–99)
POTASSIUM: 3.2 mmol/L — AB (ref 3.5–5.1)
Sodium: 143 mmol/L (ref 135–145)

## 2014-12-04 LAB — CBC WITH DIFFERENTIAL/PLATELET
Basophils Absolute: 0 10*3/uL (ref 0.0–0.1)
Basophils Relative: 0 % (ref 0–1)
EOS PCT: 2 % (ref 0–5)
Eosinophils Absolute: 0.1 10*3/uL (ref 0.0–0.7)
HCT: 38.4 % (ref 36.0–46.0)
HEMOGLOBIN: 12.3 g/dL (ref 12.0–15.0)
LYMPHS PCT: 11 % — AB (ref 12–46)
Lymphs Abs: 0.8 10*3/uL (ref 0.7–4.0)
MCH: 31.6 pg (ref 26.0–34.0)
MCHC: 32 g/dL (ref 30.0–36.0)
MCV: 98.7 fL (ref 78.0–100.0)
MONO ABS: 0.6 10*3/uL (ref 0.1–1.0)
MONOS PCT: 8 % (ref 3–12)
NEUTROS ABS: 5.8 10*3/uL (ref 1.7–7.7)
Neutrophils Relative %: 79 % — ABNORMAL HIGH (ref 43–77)
Platelets: 185 10*3/uL (ref 150–400)
RBC: 3.89 MIL/uL (ref 3.87–5.11)
RDW: 13.3 % (ref 11.5–15.5)
WBC: 7.4 10*3/uL (ref 4.0–10.5)

## 2014-12-04 LAB — BRAIN NATRIURETIC PEPTIDE: B NATRIURETIC PEPTIDE 5: 139 pg/mL — AB (ref 0.0–100.0)

## 2014-12-04 NOTE — Progress Notes (Signed)
Patient ID: Briana Schultz, female   DOB: 10/17/1912, 81 y.o.   MRN: [id number]       This is an acute visit.  Level care skilled.  Facility [facility].  Complaint-  History of present illness.  Patient is a 81 year old female who is familiar to our service-she has had frequent admissions to the facility most recently she was admitted the facility after hospitalization for acute on chronic respiratory failure-somewhat unclear etiology-she now is on oxygen.   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  Only change was a fracture of the distal locking screw that was new since the previous study.  She is not really complaining of any increased hip pain here-per ER evaluation and orthopedic consultation-it was thought this did not preclude limiting her activities-again she is largely nonambulatory secondary to significant debility and weakness   Patient appears to have routine labs done which actually is fairly unremarkable except for potassium of 3.2-she is on Lasix 20 mg a day however she is not on potassium supplementation. She does have a history of diastolic CHF  Clinically she appears to be doing relatively well she is up in her chair eating lunch at this time appears to be comfortable does not complain of pain or shortness of breath currently a low pain has been an issue in the past.    Family medical social history reviewed per initial note on 11/18/2014.  Medications have been reviewed per MAR.  Review of systems.  Somewhat limited since patient is a somewhat poor historian.  In general she has been afebrile does not complain of chills.  Respiratory history of respiratory failure but does not complain specifically of shortness of breath or increased cough .  Cardiac does not complaining of any.  GI does not complain of any abdominal discomfort nausea or  vomiting.  GU-does not complaining of dysuria she did have a Foley catheter at one point but this has been removed  Musculoskeletal has significant debility and weakness generalized she's not really complaining of hip pain however.  Neurologic does not complain of dizziness or headache.  Psych she does appear to have some history of anxiety is    Physical exam.  Denture 97.9 pulse 96 respirations 20 blood pressure 148/76-122/67 recently in this range weight is 93 this appears to be gain of about 3 pounds over the past several days baseline appears to be more in the 89 range  In general this is a very frail elderly female in no distress she appears to be comfortable up in her chair eating lunch.  Skin is warm and dry she does have numerous solar induced changes which appear to be relatively unchanged.     Heart is regular rate and rhythm I do not note significant lower extremity edema.  Chest is clear to auscultation but quite shallow air entry she is quite K phonic there is no labored breathing.  Abdomen is somewhat distended soft does not appear to be acutely tender there are positive bowel sounds.  Musculoskeletal is able to move all extremities 4  Has generalized significant frailty  Neurologic appears to be grossly intact her speech is clear.  Psych she is oriented to self again appears to be somewhat anxious which can be the case at times.  Labs.  12/04/2014.  Sodium 143 potassium 3.2 BUN 20 creatinine 0.57.  WBC 7.4 hemoglobin 12.3 platelets  185  11/18/2014.  Sodium 138 potassium 3.5 BUN 16 creatinine 0.65.  WBC 10.2 hemoglobin 13.9 platelets 183  Assessment and plan.  #1 --Hypokalemia-I note she is on Lasix without potassium supplementation currently-will give a dose of 40 mEq of potassium today and then reduce to 20 mEq a day recheck a lab on Friday-and also next week to ensure stability.  #2-history of diastolic CHF-clinically she appears to be stable I  do not see signs of overt CHF appears she may have had some weight gain here although this appears to be a one-time weight and may be more of a scale variation-she will need to be weighed tomorrow notify provider of any weight gain clinically again she looks doing relatively well considering her numerous comorbidities Monitor vital signs every shift with pulse ox 72 hours   [id number]

## 2014-12-06 ENCOUNTER — Encounter (HOSPITAL_COMMUNITY)
Admission: AD | Admit: 2014-12-06 | Discharge: 2014-12-06 | Disposition: A | Discharge status: Home / Self Care | Payer: Medicare Other | Source: Skilled Nursing Facility | Attending: Internal Medicine | Admitting: Internal Medicine

## 2014-12-06 LAB — BASIC METABOLIC PANEL
ANION GAP: 6 (ref 5–15)
BUN: 20 mg/dL (ref 6–20)
CALCIUM: 9.1 mg/dL (ref 8.9–10.3)
CHLORIDE: 100 mmol/L — AB (ref 101–111)
CO2: 36 mmol/L — ABNORMAL HIGH (ref 22–32)
CREATININE: 0.63 mg/dL (ref 0.44–1.00)
GFR calc Af Amer: >60 mL/min (ref >60)
GLUCOSE: 112 mg/dL — AB (ref 65–99)
Potassium: 4 mmol/L (ref 3.5–5.1)
SODIUM: 142 mmol/L (ref 135–145)

## 2014-12-07 LAB — VITAMIN D 25 HYDROXY (VIT D DEFICIENCY, FRACTURES): VIT D 25 HYDROXY: 48.8 ng/mL (ref 30.0–100.0)

## 2014-12-09 ENCOUNTER — Encounter (HOSPITAL_COMMUNITY)
Admission: RE | Admit: 2014-12-09 | Discharge: 2014-12-09 | Disposition: A | Discharge status: Home / Self Care | Payer: Medicare Other | Source: Skilled Nursing Facility | Attending: Internal Medicine | Admitting: Internal Medicine

## 2014-12-09 LAB — BASIC METABOLIC PANEL
ANION GAP: 7 (ref 5–15)
BUN: 20 mg/dL (ref 6–20)
CO2: 34 mmol/L — AB (ref 22–32)
Calcium: 9 mg/dL (ref 8.9–10.3)
Chloride: 100 mmol/L — ABNORMAL LOW (ref 101–111)
Creatinine, Ser: 0.55 mg/dL (ref 0.44–1.00)
GFR calc Af Amer: >60 mL/min (ref >60)
GFR calc non Af Amer: >60 mL/min (ref >60)
Glucose, Bld: 111 mg/dL — ABNORMAL HIGH (ref 65–99)
POTASSIUM: 4.1 mmol/L (ref 3.5–5.1)
SODIUM: 141 mmol/L (ref 135–145)

## 2014-12-11 ENCOUNTER — Encounter (HOSPITAL_COMMUNITY)
Admission: AD | Admit: 2014-12-11 | Discharge: 2014-12-11 | Disposition: A | Discharge status: Home / Self Care | Payer: Medicare Other | Source: Skilled Nursing Facility | Attending: Internal Medicine | Admitting: Internal Medicine

## 2014-12-11 ENCOUNTER — Non-Acute Institutional Stay (SKILLED_NURSING_FACILITY): Payer: Medicare Other | Admitting: Internal Medicine

## 2014-12-11 ENCOUNTER — Encounter: Payer: Self-pay | Admitting: Internal Medicine

## 2014-12-11 DIAGNOSIS — I5032 Chronic diastolic (congestive) heart failure: Secondary | ICD-10-CM

## 2014-12-11 DIAGNOSIS — I482 Chronic atrial fibrillation, unspecified: Secondary | ICD-10-CM

## 2014-12-11 DIAGNOSIS — I10 Essential (primary) hypertension: Secondary | ICD-10-CM

## 2014-12-11 DIAGNOSIS — J9601 Acute respiratory failure with hypoxia: Secondary | ICD-10-CM | POA: Diagnosis not present

## 2014-12-11 LAB — BASIC METABOLIC PANEL
ANION GAP: 6 (ref 5–15)
BUN: 20 mg/dL (ref 6–20)
CALCIUM: 9.1 mg/dL (ref 8.9–10.3)
CHLORIDE: 100 mmol/L — AB (ref 101–111)
CO2: 33 mmol/L — ABNORMAL HIGH (ref 22–32)
Creatinine, Ser: 0.66 mg/dL (ref 0.44–1.00)
GFR calc Af Amer: >60 mL/min (ref >60)
GFR calc non Af Amer: >60 mL/min (ref >60)
GLUCOSE: 114 mg/dL — AB (ref 65–99)
Potassium: 3.9 mmol/L (ref 3.5–5.1)
Sodium: 139 mmol/L (ref 135–145)

## 2014-12-11 NOTE — Progress Notes (Signed)
Patient ID: Briana Schultz, female   DOB: 05/28/1913, 81 y.o.   MRN: [id number]      this is a discharge note.  Level of care skilled.  Facility [facility].  Chief complaint-discharge note  History of present illness; this is a frail patient who was here in the summer of 2015 after having a hip fracture. She came back to us this year after her right humerus fracture, she was taken home by her family but returned to the facility when they can no longer meet her needs. She was sent to the hospital most recently  after falling out of bed and hitting her head. She was noted to be increasingly lethargic and apparently had vomited prior to admission. She was found to be hypoxic by EMS and they reported that she was cyanotic. She was felt to be in CHF in the emergency room. Her BNP was elevated above previous levels. She was felt to have aspiration pneumonia also given intravenous Lasix. She required BiPAP. She was given empiric IV antibiotics. However repeat x-ray showed no acute lung disease. He was also noted that she had frequent complaints of nausea and at some point it was noted that she had not had a bowel movement in 4-5 days. After she had a bowel movement her intake apparently improved.  Her initial blood gases showed an acute respiratory acidosis with a PCO2 of over 90. As mentioned she did receive BiPAP and her last arterial blood gas showed a PCO2 in the high 70s but this was compensated  Her stay here this time has been relatively unremarkable she will be going back home with her daughter-respiratory status has been stable she does not complain of shortness of breath continues to be very frail at her baseline.  Lab Results  Component Value Date   CREATININE 0.72 11/16/2014   CREATININE 0.67 11/15/2014   CREATININE 0.69 11/14/2014    CBC Latest Ref Rng 11/16/2014 11/14/2014 11/11/2014  WBC 4.0 - 10.5 K/uL 10.6(H) 7.4 9.9  Hemoglobin 12.0 - 15.0 g/dL 40.912.9 81.112.6 91.412.8  Hematocrit 36.0 -  46.0 % 39.5 40.5 39.7  Platelets 150 - 400 K/uL 130(L) 146(L) 134(L)     Results for Briana AdolphSHAFFER, Christine M (MRN [id number]) as of 11/17/2014 11:09  Ref. Range 11/08/2014 19:04 11/08/2014 23:36 11/09/2014 10:25 11/10/2014 05:07  Sample type Unknown ARTERIAL ARTERIAL DRAW ARTERIAL ARTERIAL DRAW  Delivery systems Unknown NASAL CANNULA NASAL CANNULA NASAL CANNULA NASAL CANNULA  FIO2 Latest Units: % 0.32 0.28  0.28  O2 Content Latest Units: L/min   2.0   pH, Arterial Latest Ref Range: 7.350-7.450  7.219 (L) 7.239 (L) 7.337 (L) 7.380  pCO2 arterial Latest Ref Range: 35.0-45.0 mmHg 92.9 (HH) 86.4 (HH) 73.7 (HH) 77.5 (HH)  pO2, Arterial Latest Ref Range: 80.0-100.0 mmHg 62.8 (L) 81.7 72.0 (L) 78.6 (L)  Bicarbonate Latest Ref Range: 20.0-24.0 mEq/L 36.5 (H) 35.7 (H) 38.4 (H) 44.8 (H)  TCO2 Latest Ref Range: 0-100 mmol/L 27.9 33.6 35.3 41.1  Acid-Base Excess Latest Ref Range: 0.0-2.0 mmol/L 8.9 (H) 8.4 (H) 12.2 (H) 18.6 (H)  O2 Saturation Latest Units: % 88.1 94.2 93.5 95.1  Patient temperature Unknown 37.0 37.0 37.0 37.0  Collection site Unknown BRACHIAL ARTERY RIGHT RADIAL RIGHT RADIAL LEFT RADIAL  Allens test (pass/fail) Latest Ref Range: PASS  NOT INDICATED (A) PASS PASS PASS    Past Medical History  Diagnosis Date  . Fall     secondary to weakness as of  [place] OV  03/26/10  .  H/O orthostatic hypotension   . Chest discomfort     anterior  . Hypertension   . Aortic valve stenosis     mild to moderate  . Syncope and collapse 2006  . Arthritis   . GERD (gastroesophageal reflux disease)   . Osteoporosis   . Abnormal EKG   . Scarlet fever   . Scoliosis   . TIA (transient ischemic attack) 1999  . Fracture, humerus     2016  . A-fib     02/2014, not on anticoagulation   . Diastolic CHF 01/30/2014  . Proximal humerus fracture   . On home O2 08/2014    2L N/C  . Anemia   . Thrombocytopenia   . DNR (do not resuscitate) 08/2014  . Malnutrition   . Frequent falls   . Mild dementia    Past  Surgical History  Procedure Laterality Date  . Cholecystectomy    . Cataract extraction Bilateral   . Femur im nail Right 12/17/2013    Procedure: INTRAMEDULLARY (IM) NAIL FEMORAL;  Surgeon: Sheral Apley, MD;  Location: [facility] OR;  Service: Orthopedics;  Laterality: Right;    Current Outpatient Prescriptions on File Prior to Visit  Medication Sig Dispense Refill  . acetaminophen (TYLENOL) 500 MG tablet Take 500 mg by mouth every 6 (six) hours as needed.    . ALPRAZolam (XANAX) 0.5 MG tablet Take one tablet by mouth at bedtime to help rest 20 tablet 0  . alum & mag hydroxide-simeth (MAALOX/MYLANTA) 200-200-20 MG/5ML suspension Take 30 mLs by mouth 2 (two) times daily as needed for indigestion.    Marland Kitchen amLODipine (NORVASC) 5 MG tablet Take 10 mg by mouth daily.     . bisacodyl (DULCOLAX) 10 MG suppository Place 1 suppository (10 mg total) rectally daily as needed for moderate constipation. 12 suppository 0  . calcium carbonate (OS-CAL) 600 MG TABS Take 1,200 mg by mouth daily with breakfast.     . carvedilol (COREG) 3.125 MG tablet Take 1 tablet (3.125 mg total) by mouth 2 (two) times daily. 180 tablet 3  . ciprofloxacin (CIPRO) 500 MG tablet Take 1 tablet (500 mg total) by mouth every 12 (twelve) hours. 10 tablet 0  . docusate sodium (COLACE) 100 MG capsule Take 1 capsule (100 mg total) by mouth 2 (two) times daily. Continue this while taking narcotics to help with bowel movements 30 capsule 1  . esomeprazole (NEXIUM) 40 MG capsule Take 40 mg by mouth daily at 12 noon.    . feeding supplement (GLUCERNA SHAKE) LIQD Take 237 mLs by mouth 3 (three) times daily between meals.     . ferrous sulfate 325 (65 FE) MG tablet Take 1 tablet (325 mg total) by mouth 2 (two) times daily with a meal.  3  . furosemide (LASIX) 20 MG tablet Take 1 tablet (20 mg total) by mouth daily. 30 tablet 1  . loratadine (CLARITIN) 10 MG tablet Take 10 mg by mouth daily.    . Multiple Vitamin (MULTIVITAMIN WITH MINERALS) TABS  Take 1 tablet by mouth daily.    . pantoprazole (PROTONIX) 40 MG tablet Take 1 tablet (40 mg total) by mouth daily. 30 tablet 1  . polyethylene glycol (MIRALAX / GLYCOLAX) packet Take 17 g by mouth daily. 14 each 0  . Vitamin D, Ergocalciferol, (DRISDOL) 50000 UNITS CAPS capsule Take 50,000 Units by mouth every 7 (seven) days. Take on Friday.      Social history; patient has become an ongoing resident here. She  is very frail. There are no advanced directives.  family history includes Diabetes in her father; Heart disease in her sister; Heart failure in her father; Pneumonia in her mother.  Review of systems In general no complaints of fever or chills HEENT; has complained of sore throat in the past but is not complaining of that today Respiratory; no shortness of breath or cough reported Cardiac does not complain of chest pain GI--complains of dysphagia at times does not complaining of abdominal pain today.  Muscle skeletal has general frailty and chronic pain at this point appears to be relatively controlled.  Neurologic does not complain of dizziness or syncopal-type feelings.  Physical examination \ Temperature is 97.5 pulse 94 respirations 18 blood pressure 159/71-120/73 recently in this range weight is 88.5 which appears to be relatively stable Gen.; patient appears very frail baseline \\Her  skin is warm and dry.  Oropharynx is clear mucous membranes moist.   Respiratory coarse upper airway sounds very shallow air entry. Very significant thoracic kyphosis. Cardiac; heart sounds are distant JVP is not elevated.  quite mild pedal edema Abdomen; somewhat distended no liver no spleen no overt tenderness. No liver no spleen GU no suprapubic fullness or tenderness no costovertebral angle tenderness Extremities; severe bilateral venous stasis   She had 2 small covered areas on the right lower arm and left lower leg I suspect these are skin tears per nursing no sign of  infection  Labs.  12/11/2014.  Sodium 139 potassium 3.9 BUN 20 creatinine 0.66.  12/04/2014.  WBC 7.4 hemoglobin 12.3 platelets 185    Impression/plan #1 acute on chronic respiratory failure. The exact cause of this is unclear saw possibly combination CHF-pneumonia-nonetheless she appears to be stable at this point although continues to be of very fragile individual she is on low-dose Lasix with potassium recent labs look stable --update BMP is pending for later this week  #2 diastolic dysfunction with acute on chronic diastolic heart failure. As noted above she continues on Lasix potassium as well as a beta blocker.  #3 hypertension has variable systolic readings as noted above at this point will defer to primary care provider she is on Coreg  And Norvasc I assume there are as loose control secondary to her advanced age and fall risk.  #4-history of dysphagia-Dr. Leanord Hawking has suggested possibly a GI series at some point will defer to primary care provider since she is going home--she is on a proton pump inhibitor  [id number]-of note greater than 30 minutes spent on this discharge summary.

## 2014-12-12 ENCOUNTER — Other Ambulatory Visit: Payer: Self-pay

## 2014-12-12 MED ORDER — ALPRAZOLAM 0.25 MG PO TABS: 0.2500 mg | ORAL_TABLET | Freq: Every evening | ORAL | Status: AC | PRN

## 2014-12-12 NOTE — Telephone Encounter (Signed)
RX faxed to [facility] @ [phone]. Phone number [phone]  

## 2014-12-17 ENCOUNTER — Ambulatory Visit: Payer: Medicare Other | Admitting: Orthopedic Surgery

## 2015-01-17 ENCOUNTER — Other Ambulatory Visit: Payer: Self-pay | Admitting: Internal Medicine

## 2015-01-21 ENCOUNTER — Emergency Department (HOSPITAL_COMMUNITY)
Admission: EM | Admit: 2015-01-21 | Discharge: 2015-01-21 | Disposition: A | Discharge status: Home / Self Care | Payer: Medicare Other | Attending: Emergency Medicine | Admitting: Emergency Medicine

## 2015-01-21 ENCOUNTER — Emergency Department (HOSPITAL_COMMUNITY): Payer: Medicare Other

## 2015-01-21 ENCOUNTER — Encounter (HOSPITAL_COMMUNITY): Payer: Self-pay | Admitting: Cardiology

## 2015-01-21 ENCOUNTER — Telehealth: Payer: Self-pay

## 2015-01-21 DIAGNOSIS — Z9981 Dependence on supplemental oxygen: Secondary | ICD-10-CM | POA: Insufficient documentation

## 2015-01-21 DIAGNOSIS — Z880 Allergy status to penicillin: Secondary | ICD-10-CM | POA: Diagnosis not present

## 2015-01-21 DIAGNOSIS — Z8619 Personal history of other infectious and parasitic diseases: Secondary | ICD-10-CM | POA: Diagnosis not present

## 2015-01-21 DIAGNOSIS — I10 Essential (primary) hypertension: Secondary | ICD-10-CM | POA: Diagnosis not present

## 2015-01-21 DIAGNOSIS — M810 Age-related osteoporosis without current pathological fracture: Secondary | ICD-10-CM | POA: Insufficient documentation

## 2015-01-21 DIAGNOSIS — H9190 Unspecified hearing loss, unspecified ear: Secondary | ICD-10-CM | POA: Diagnosis not present

## 2015-01-21 DIAGNOSIS — Z8781 Personal history of (healed) traumatic fracture: Secondary | ICD-10-CM | POA: Diagnosis not present

## 2015-01-21 DIAGNOSIS — F0390 Unspecified dementia without behavioral disturbance: Secondary | ICD-10-CM | POA: Insufficient documentation

## 2015-01-21 DIAGNOSIS — Z66 Do not resuscitate: Secondary | ICD-10-CM | POA: Diagnosis not present

## 2015-01-21 DIAGNOSIS — I5030 Unspecified diastolic (congestive) heart failure: Secondary | ICD-10-CM | POA: Insufficient documentation

## 2015-01-21 DIAGNOSIS — R609 Edema, unspecified: Secondary | ICD-10-CM | POA: Diagnosis not present

## 2015-01-21 DIAGNOSIS — R0600 Dyspnea, unspecified: Secondary | ICD-10-CM | POA: Diagnosis not present

## 2015-01-21 DIAGNOSIS — Z9181 History of falling: Secondary | ICD-10-CM | POA: Diagnosis not present

## 2015-01-21 DIAGNOSIS — M1990 Unspecified osteoarthritis, unspecified site: Secondary | ICD-10-CM | POA: Diagnosis not present

## 2015-01-21 DIAGNOSIS — M419 Scoliosis, unspecified: Secondary | ICD-10-CM | POA: Insufficient documentation

## 2015-01-21 DIAGNOSIS — K219 Gastro-esophageal reflux disease without esophagitis: Secondary | ICD-10-CM | POA: Insufficient documentation

## 2015-01-21 DIAGNOSIS — Z87891 Personal history of nicotine dependence: Secondary | ICD-10-CM | POA: Insufficient documentation

## 2015-01-21 DIAGNOSIS — Z8673 Personal history of transient ischemic attack (TIA), and cerebral infarction without residual deficits: Secondary | ICD-10-CM | POA: Diagnosis not present

## 2015-01-21 DIAGNOSIS — Z862 Personal history of diseases of the blood and blood-forming organs and certain disorders involving the immune mechanism: Secondary | ICD-10-CM | POA: Insufficient documentation

## 2015-01-21 DIAGNOSIS — I4891 Unspecified atrial fibrillation: Secondary | ICD-10-CM | POA: Insufficient documentation

## 2015-01-21 DIAGNOSIS — Z791 Long term (current) use of non-steroidal anti-inflammatories (NSAID): Secondary | ICD-10-CM | POA: Diagnosis not present

## 2015-01-21 DIAGNOSIS — Z79899 Other long term (current) drug therapy: Secondary | ICD-10-CM | POA: Insufficient documentation

## 2015-01-21 DIAGNOSIS — R0602 Shortness of breath: Secondary | ICD-10-CM | POA: Diagnosis present

## 2015-01-21 LAB — CBC WITH DIFFERENTIAL/PLATELET
Basophils Absolute: 0 10*3/uL (ref 0.0–0.1)
Basophils Relative: 1 % (ref 0–1)
EOS ABS: 0.3 10*3/uL (ref 0.0–0.7)
Eosinophils Relative: 4 % (ref 0–5)
HCT: 39.1 % (ref 36.0–46.0)
Hemoglobin: 12.7 g/dL (ref 12.0–15.0)
LYMPHS ABS: 1 10*3/uL (ref 0.7–4.0)
Lymphocytes Relative: 17 % (ref 12–46)
MCH: 32.4 pg (ref 26.0–34.0)
MCHC: 32.5 g/dL (ref 30.0–36.0)
MCV: 99.7 fL (ref 78.0–100.0)
Monocytes Absolute: 0.6 10*3/uL (ref 0.1–1.0)
Monocytes Relative: 10 % (ref 3–12)
NEUTROS ABS: 3.9 10*3/uL (ref 1.7–7.7)
Neutrophils Relative %: 68 % (ref 43–77)
PLATELETS: 177 10*3/uL (ref 150–400)
RBC: 3.92 MIL/uL (ref 3.87–5.11)
RDW: 13.8 % (ref 11.5–15.5)
WBC: 5.8 10*3/uL (ref 4.0–10.5)

## 2015-01-21 LAB — COMPREHENSIVE METABOLIC PANEL
ALT: 13 U/L — ABNORMAL LOW (ref 14–54)
ANION GAP: 8 (ref 5–15)
AST: 19 U/L (ref 15–41)
Albumin: 4 g/dL (ref 3.5–5.0)
Alkaline Phosphatase: 93 U/L (ref 38–126)
BILIRUBIN TOTAL: 0.8 mg/dL (ref 0.3–1.2)
BUN: 20 mg/dL (ref 6–20)
CO2: 38 mmol/L — ABNORMAL HIGH (ref 22–32)
Calcium: 9.3 mg/dL (ref 8.9–10.3)
Chloride: 95 mmol/L — ABNORMAL LOW (ref 101–111)
Creatinine, Ser: 0.97 mg/dL (ref 0.44–1.00)
GFR calc Af Amer: 54 mL/min — ABNORMAL LOW (ref >60)
GFR, EST NON AFRICAN AMERICAN: 46 mL/min — AB (ref >60)
Glucose, Bld: 123 mg/dL — ABNORMAL HIGH (ref 65–99)
Potassium: 4.9 mmol/L (ref 3.5–5.1)
SODIUM: 141 mmol/L (ref 135–145)
TOTAL PROTEIN: 6.7 g/dL (ref 6.5–8.1)

## 2015-01-21 LAB — TROPONIN I: Troponin I: <0.03 ng/mL (ref <0.031)

## 2015-01-21 MED ORDER — ALBUTEROL SULFATE HFA 108 (90 BASE) MCG/ACT IN AERS: 1.0000 | INHALATION_SPRAY | Freq: Four times a day (QID) | RESPIRATORY_TRACT | Status: AC | PRN

## 2015-01-21 NOTE — Discharge Instructions (Signed)
Prescription for nasal oxygen given. Follow-up your primary care doctor

## 2015-01-21 NOTE — ED Provider Notes (Signed)
CSN: [id number]     Arrival date & time 01/21/15  1459 History   First MD Initiated Contact with Patient 01/21/15 1512     Chief Complaint  Patient presents with  . Shortness of Breath     (Consider location/radiation/quality/duration/timing/severity/associated sxs/prior Treatment) HPI..... Level V caveat for mild dementia and deafness.  Daughter reports low oxygen saturation at home [in the 70's and 80's].  Patient is not complaining of dyspnea nor chest pain.  No prodromal illnesses. No fever or chills. Questionable minimal weight gain. Severity is mild.  Past Medical History  Diagnosis Date  . Fall     secondary to weakness as of  [place] OV  03/26/10  . H/O orthostatic hypotension   . Chest discomfort     anterior  . Hypertension   . Aortic valve stenosis     mild to moderate  . Syncope and collapse 2006  . Arthritis   . GERD (gastroesophageal reflux disease)   . Osteoporosis   . Abnormal EKG   . Scarlet fever   . Scoliosis   . TIA (transient ischemic attack) 1999  . Fracture, humerus     2016  . A-fib     02/2014, not on anticoagulation   . Diastolic CHF 01/30/2014  . Proximal humerus fracture   . On home O2 08/2014    2L N/C  . Anemia   . Thrombocytopenia   . DNR (do not resuscitate) 08/2014  . Malnutrition   . Frequent falls   . Mild dementia    Past Surgical History  Procedure Laterality Date  . Cholecystectomy    . Cataract extraction Bilateral   . Femur im nail Right 12/17/2013    Procedure: INTRAMEDULLARY (IM) NAIL FEMORAL;  Surgeon: Sheral Apley, MD;  Location: [facility] OR;  Service: Orthopedics;  Laterality: Right;   Family History  Problem Relation Age of Onset  . Pneumonia Mother   . Heart failure Father   . Diabetes Father   . Heart disease Sister    History  Substance Use Topics  . Smoking status: Former Smoker    Quit date: 01/16/1981  . Smokeless tobacco: Never Used  . Alcohol Use: No   OB History    No data available     Review of  Systems  All other systems reviewed and are negative.     Allergies  Amoxicillin; Doxycycline; Fosamax; Septra; and Tramadol  Home Medications   Prior to Admission medications   Medication Sig Start Date End Date Taking? Authorizing Provider  acetaminophen (TYLENOL) 500 MG tablet Take 1,000 mg by mouth every 6 (six) hours as needed.    Yes Historical Provider, MD  ALPRAZolam Prudy Feeler) 0.25 MG tablet Take 1 tablet (0.25 mg total) by mouth at bedtime as needed for anxiety (May have 1 dose during the day if needed). 12/12/14  Yes Sharon Seller, NP  amLODipine (NORVASC) 5 MG tablet Take 10 mg by mouth daily.    Yes Historical Provider, MD  bisacodyl (DULCOLAX) 10 MG suppository Place 1 suppository (10 mg total) rectally daily as needed for moderate constipation. 11/15/14  Yes Jeralyn Bennett, MD  calcium carbonate (OS-CAL) 600 MG TABS Take 1,200 mg by mouth daily with breakfast.    Yes Historical Provider, MD  carvedilol (COREG) 3.125 MG tablet Take 1 tablet (3.125 mg total) by mouth 2 (two) times daily. 02/04/14  Yes Antoine Poche, MD  docusate sodium (COLACE) 100 MG capsule Take 1 capsule (100 mg total) by mouth  2 (two) times daily. Continue this while taking narcotics to help with bowel movements 12/17/13  Yes Sheral Apleyimothy D Murphy, MD  feeding supplement (GLUCERNA SHAKE) LIQD Take 237 mLs by mouth 3 (three) times daily between meals.  01/19/12  Yes Vassie Lollarlos Madera, MD  furosemide (LASIX) 20 MG tablet Take 1 tablet (20 mg total) by mouth daily. Patient taking differently: Take 20-40 mg by mouth 2 (two) times daily. 40mg  in the morning and 20mg  in the afternoon 11/15/14  Yes Jeralyn BennettEzequiel Zamora, MD  loratadine (CLARITIN) 10 MG tablet Take 10 mg by mouth daily.   Yes Historical Provider, MD  meloxicam (MOBIC) 7.5 MG tablet Take 7.5 mg by mouth 2 (two) times daily.    Yes Historical Provider, MD  Multiple Vitamin (MULTIVITAMIN WITH MINERALS) TABS Take 1 tablet by mouth daily.   Yes Historical Provider, MD   pantoprazole (PROTONIX) 40 MG tablet Take 1 tablet (40 mg total) by mouth daily. 11/15/14  Yes Jeralyn BennettEzequiel Zamora, MD  polyethylene glycol (MIRALAX / GLYCOLAX) packet Take 17 g by mouth daily. 11/15/14  Yes Jeralyn BennettEzequiel Zamora, MD  potassium chloride SA (K-DUR,KLOR-CON) 20 MEQ tablet Take 20 mEq by mouth daily. Give an extra tablet every other day due to increase in Lasix dose 01/17/15  Yes Historical Provider, MD  Vitamin D, Ergocalciferol, (DRISDOL) 50000 UNITS CAPS capsule Take 50,000 Units by mouth every 7 (seven) days. Take on Friday.   Yes Historical Provider, MD  albuterol (PROVENTIL HFA;VENTOLIN HFA) 108 (90 BASE) MCG/ACT inhaler Inhale 1-2 puffs into the lungs every 6 (six) hours as needed for wheezing or shortness of breath. 01/21/15   Donnetta HutchingBrian Gavrielle Streck, MD   BP 138/97 mmHg  Pulse 88  Temp(Src) 97.5 F (36.4 C) (Oral)  Resp 17  SpO2 95% Physical Exam  Constitutional: She is oriented to person, place, and time.  frail  HENT:  Head: Normocephalic and atraumatic.  Eyes: Conjunctivae and EOM are normal. Pupils are equal, round, and reactive to light.  Neck: Normal range of motion. Neck supple.  Cardiovascular: Normal rate and regular rhythm.   Pulmonary/Chest: Breath sounds normal.  Slightly tachypneic  Abdominal: Soft. Bowel sounds are normal.  Musculoskeletal: Normal range of motion.  Neurological: She is alert and oriented to person, place, and time.  Skin: Skin is warm and dry.  Minimal peripheral edema.  Psychiatric: She has a normal mood and affect. Her behavior is normal.  Nursing note and vitals reviewed.   ED Course  Procedures (including critical care time) Labs Review Labs Reviewed  COMPREHENSIVE METABOLIC PANEL - Abnormal; Notable for the following:    Chloride 95 (*)    CO2 38 (*)    Glucose, Bld 123 (*)    ALT 13 (*)    GFR calc non Af Amer 46 (*)    GFR calc Af Amer 54 (*)    All other components within normal limits  CBC WITH DIFFERENTIAL/PLATELET  TROPONIN I     Imaging Review Dg Chest 2 View  01/21/2015   CLINICAL DATA:  Shortness of breath, decreased oxygen saturation, history diastolic CHF, former smoker, hypertension  EXAM: CHEST  2 VIEW  COMPARISON:  11/16/2014 chest radiograph, CT chest 08/12/2014  FINDINGS: Enlargement of cardiac silhouette.  Calcified tortuous thoracic aorta.  Pulmonary vascularity normal.  Emphysematous and minimal bronchitic changes consistent with COPD.  No acute infiltrate, pleural effusion, or pneumothorax.  Diffuse osseous demineralization.  Chronic anterior height loss of a mid thoracic vertebra appears unchanged.  Old posttraumatic deformity proximal RIGHT humerus.  IMPRESSION: COPD changes.  Enlargement of cardiac silhouette.  Extensive atherosclerotic disease.  No acute abnormalities.   Electronically Signed   By: Ulyses Southward M.D.   On: 01/21/2015 15:41     EKG Interpretation   Date/Time:  Tuesday January 21 2015 16:08:25 EDT Ventricular Rate:  96 PR Interval:  273 QRS Duration: 95 QT Interval:  378 QTC Calculation: 478 R Axis:   -63 Text Interpretation:  Sinus or ectopic atrial rhythm Prolonged PR interval  Consider right ventricular hypertrophy Left ventricular hypertrophy  Inferior infarct, old Anterior Q waves, possibly due to LVH Lateral leads  are also involved Confirmed by Adriana Simas  MD, Hildred Mollica ([id number]) on 01/21/2015  4:29:22 PM      MDM   Final diagnoses:  Dyspnea    Patient is slightly tachypneic, but daughter states this is her norm.  Screening labs, EKG, chest x-ray showed no acute anomalies. Discharge medications nasal oxygen 2 L/m and albuterol inhaler. Discussed findings with family and the patient. She has primary care follow-up on Thursday    Donnetta Hutching, MD 01/21/15 [phone]

## 2015-01-21 NOTE — ED Notes (Addendum)
EMS transported pt here for sats 85% on RA.  EMS placed pt on [place] 2 liters and sats up to 97%.  Pt asking for mother.  No complaints.  No distress.  Had chest x-ray this morning.

## 2015-01-21 NOTE — Telephone Encounter (Signed)
Daughter called to say her mother had change in mentation yesterday and home health had cxr done and told daughter Ilda [id number] was 77-80 %. t is [age] yrs old and is still confused today.I advised her to take mother direct to [facility] ED,she agreed

## 2015-03-06 DEATH — deceased

## 2016-05-17 IMAGING — CR DG CHEST 1V PORT
1 series · 1 of 1 positions shown · non-contrast
Comparison: 12/30/2013

CLINICAL DATA: Fall, hypertension.

EXAM:
CHEST  1 VIEW

[ap portable]
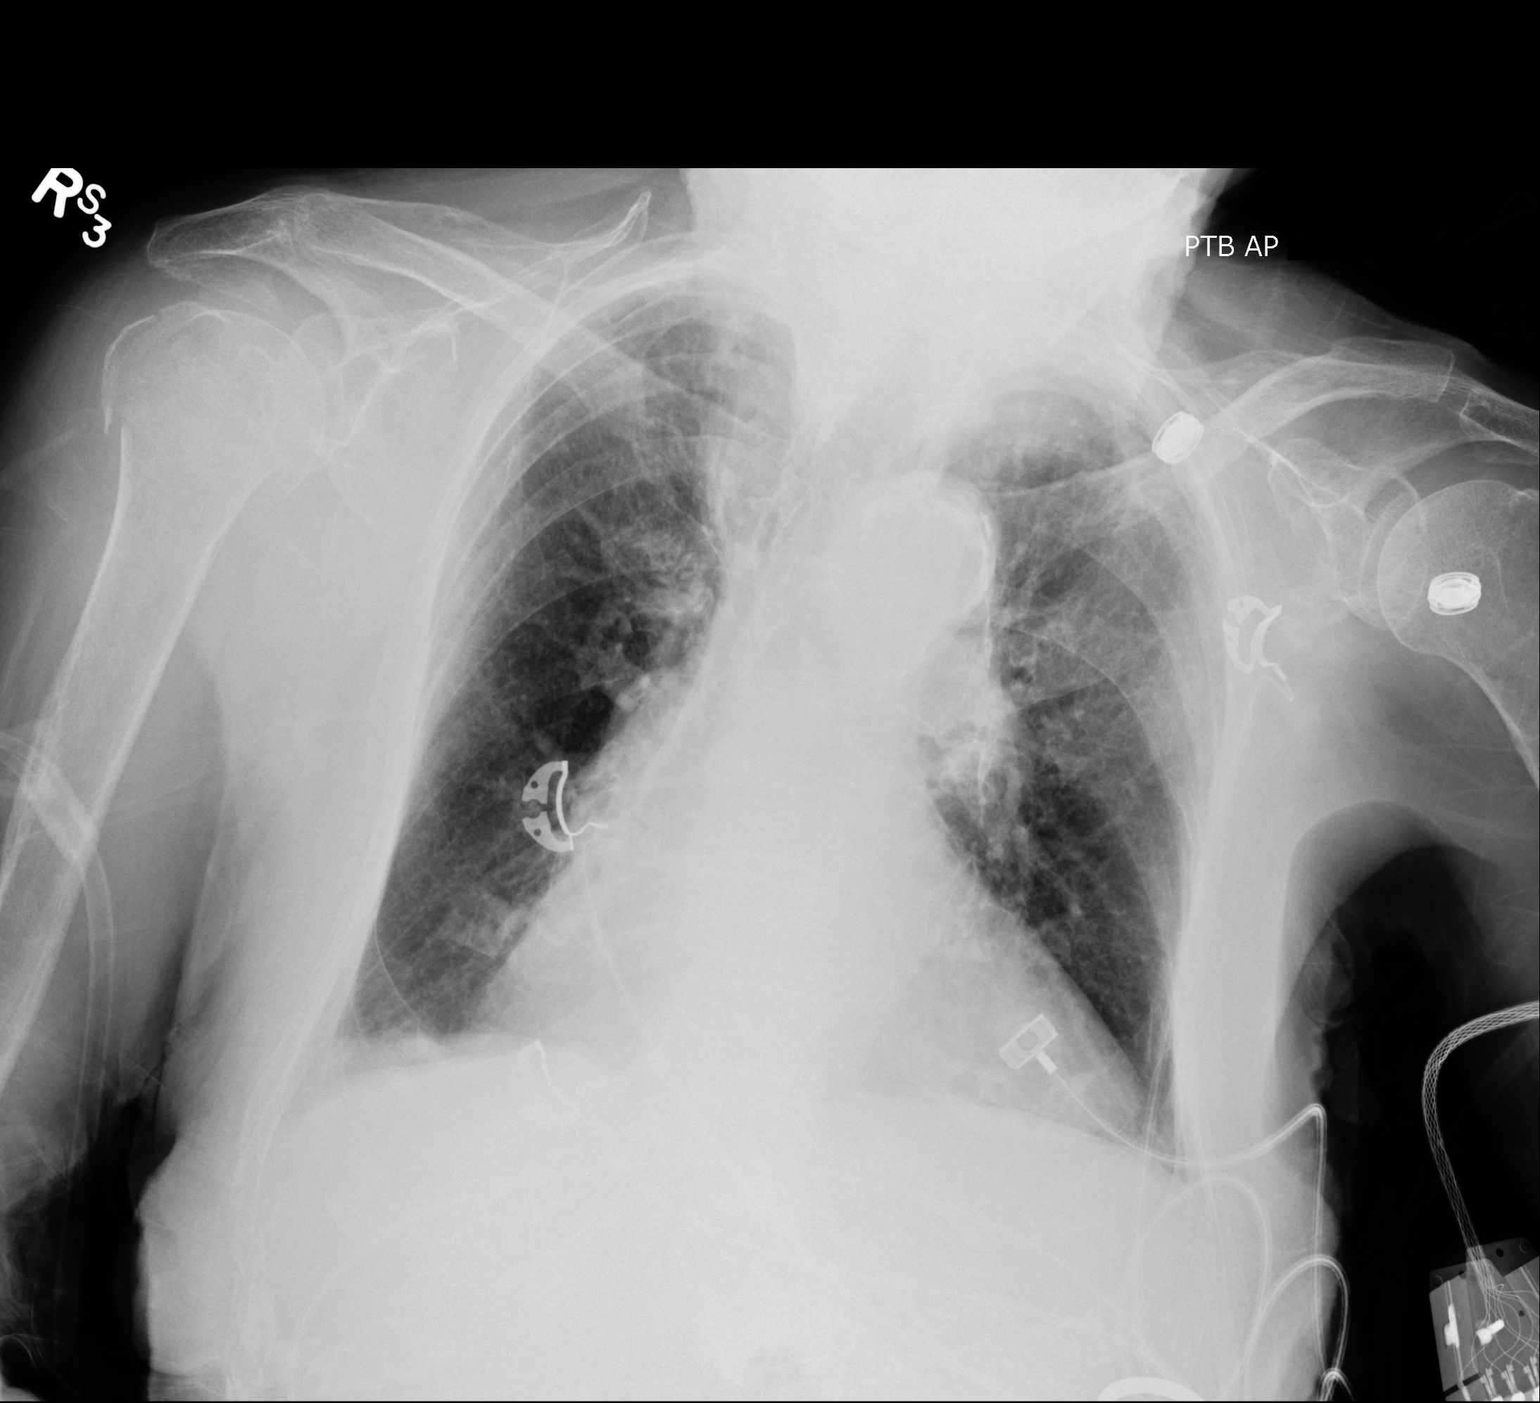

[1 of 1 positions shown; findings below may reference images not displayed]

FINDINGS: Cardiomegaly. No confluent airspace opacities or effusions.
Hyperinflation. No edema.

There is a fracture through the right humeral neck and greater
tuberosity.
IMPRESSION: Cardiomegaly, hyperinflation.  No active cardiopulmonary disease.

Right humeral neck/greater tuberosity fracture.

## 2016-05-17 IMAGING — DX DG HUMERUS 2V *R*
2 series · 2 of 2 positions shown · non-contrast
Comparison: None.

CLINICAL DATA: Pain following fall getting out of bed

EXAM:
RIGHT HUMERUS - 2+ VIEW

[humerus ap]
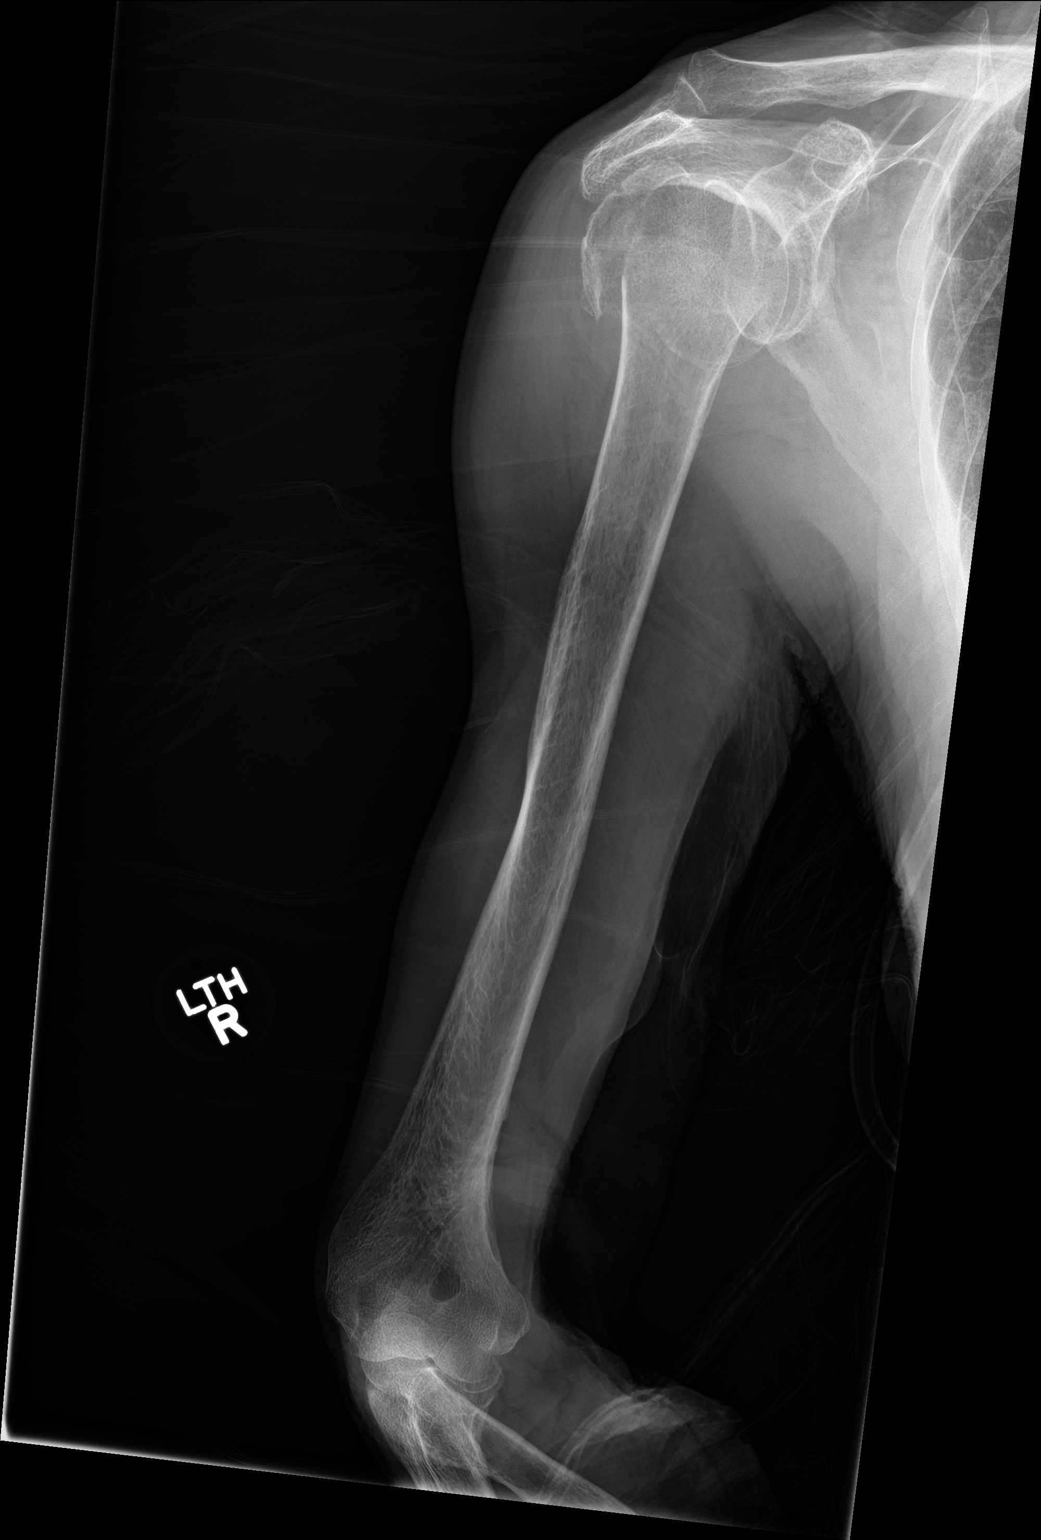

[humerus lat]
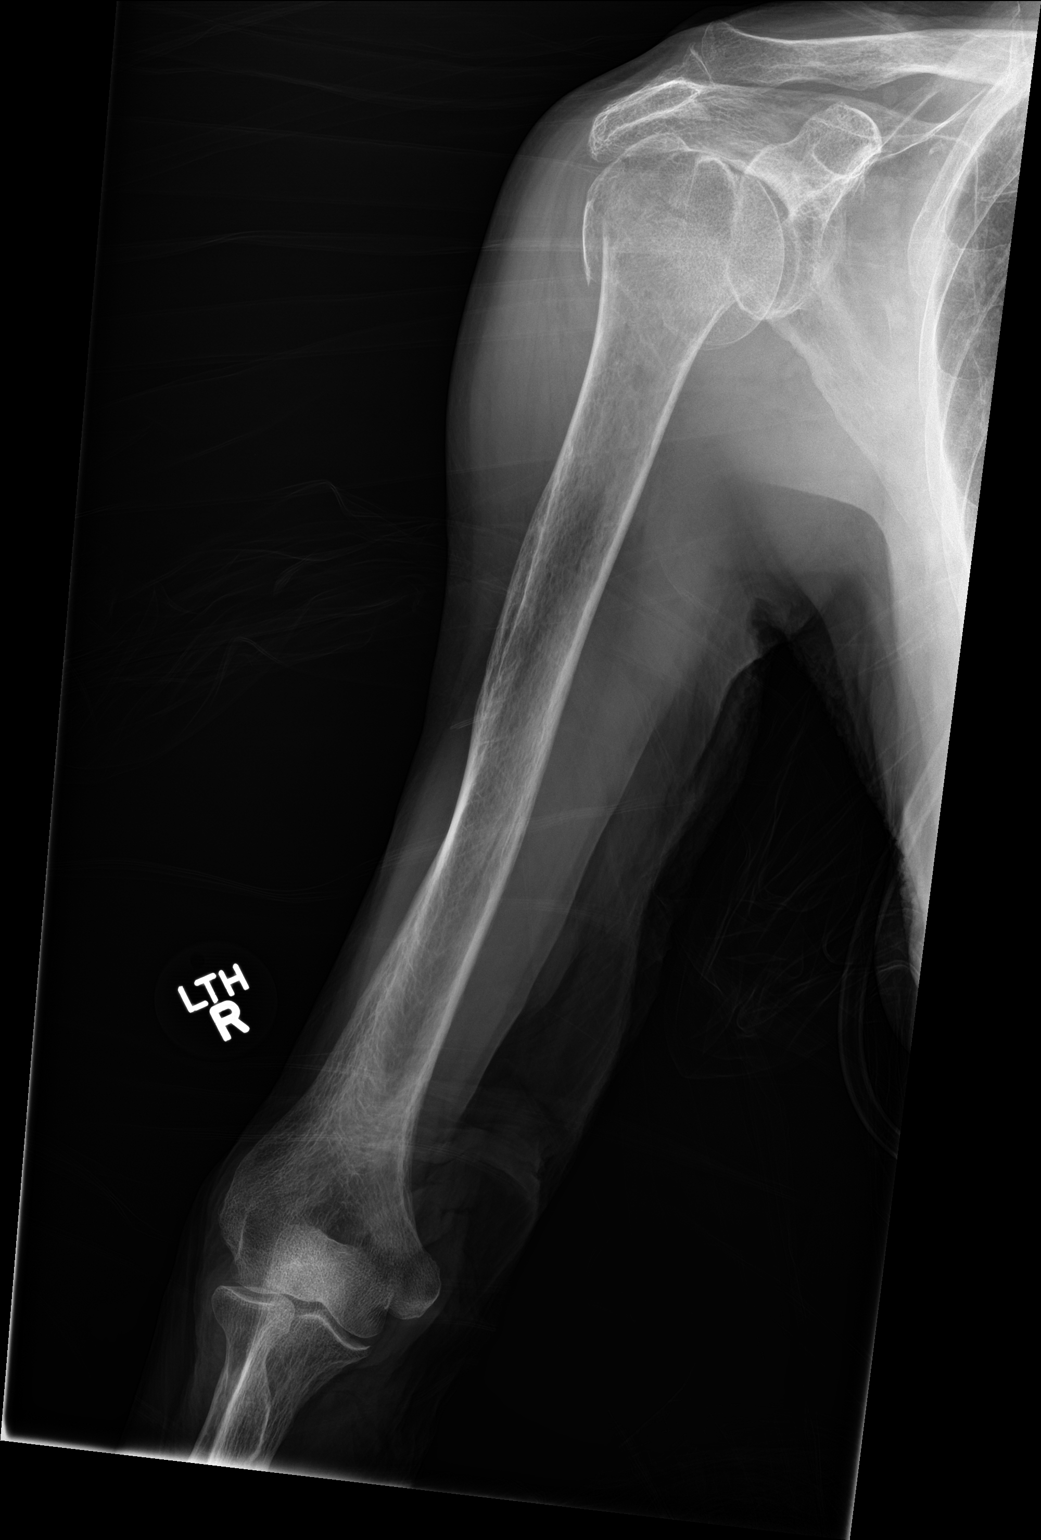

[2 of 2 positions shown; findings below may reference images not displayed]

FINDINGS: Frontal and lateral views were obtained. There is a comminuted
fracture of the proximal humeral metaphysis with impaction at the
fracture site. There is avulsion of the greater tuberosity. No other
fractures. No dislocation. Bones are osteoporotic.
IMPRESSION: Comminuted fracture proximal humeral metaphysis with avulsion of the
greater tuberosity. Impaction is noted at fracture site. No
dislocation. Bones osteoporotic.

## 2016-08-16 IMAGING — CR DG CHEST 1V
1 series · 1 of 1 positions shown · non-contrast
Comparison: September 24, 2014

CLINICAL DATA: Shortness of Breath

EXAM:
CHEST  1 VIEW

[ap]
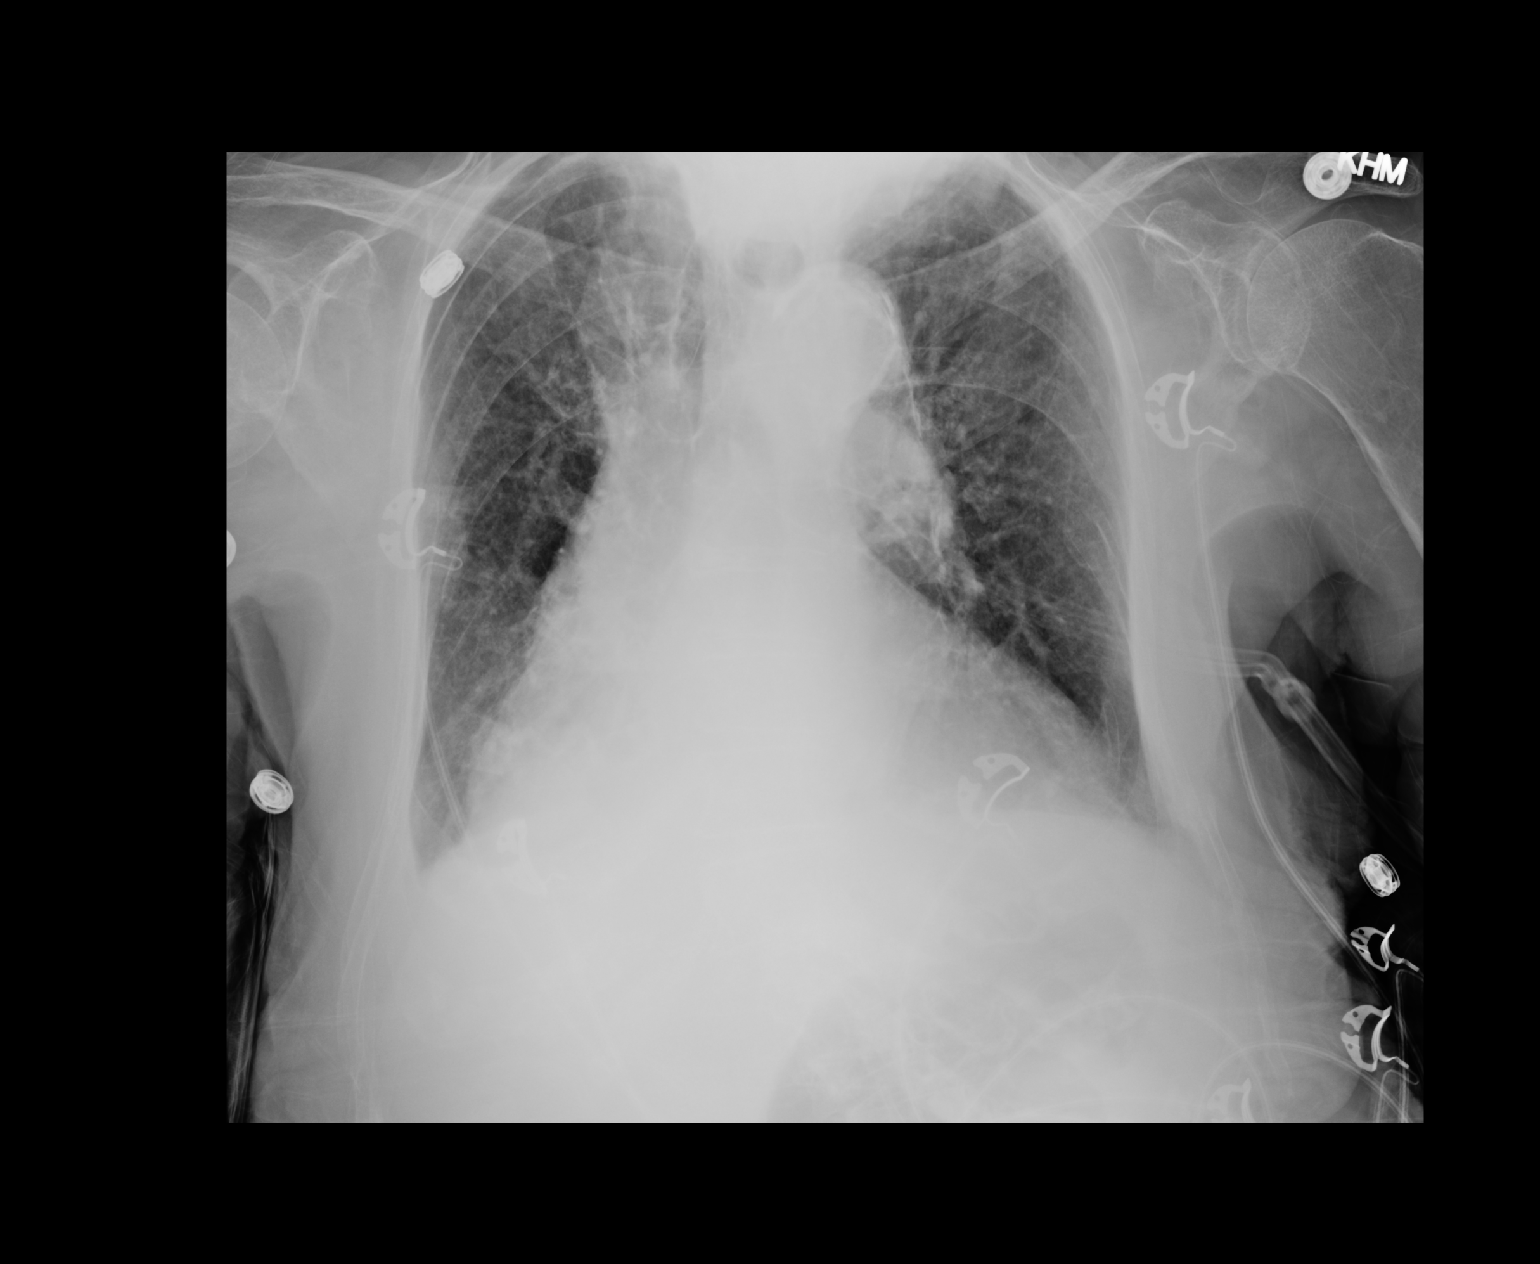

[1 of 1 positions shown; findings below may reference images not displayed]

FINDINGS: There is consolidation in the medial right base. There is
cardiomegaly with pulmonary venous hypertension and mild
interstitial edema. No adenopathy.
IMPRESSION: Evidence of a degree of congestive heart failure. Suspect
superimposed pneumonia medial right base.
